# Patient Record
Sex: Male | Born: 1960
Health system: Southern US, Community
[De-identification: ages and names within clinical notes are randomized; demographics above are authoritative.]

## PROBLEM LIST (undated history)

## (undated) DIAGNOSIS — F419 Anxiety disorder, unspecified: Secondary | ICD-10-CM

## (undated) DIAGNOSIS — Z87442 Personal history of urinary calculi: Secondary | ICD-10-CM

## (undated) DIAGNOSIS — M4800 Spinal stenosis, site unspecified: Secondary | ICD-10-CM

## (undated) DIAGNOSIS — E7800 Pure hypercholesterolemia, unspecified: Secondary | ICD-10-CM

## (undated) DIAGNOSIS — M545 Low back pain, unspecified: Secondary | ICD-10-CM

## (undated) DIAGNOSIS — F329 Major depressive disorder, single episode, unspecified: Secondary | ICD-10-CM

## (undated) DIAGNOSIS — Z8489 Family history of other specified conditions: Secondary | ICD-10-CM

## (undated) DIAGNOSIS — R519 Headache, unspecified: Secondary | ICD-10-CM

## (undated) DIAGNOSIS — F32A Depression, unspecified: Secondary | ICD-10-CM

## (undated) DIAGNOSIS — J189 Pneumonia, unspecified organism: Secondary | ICD-10-CM

## (undated) DIAGNOSIS — D649 Anemia, unspecified: Secondary | ICD-10-CM

## (undated) DIAGNOSIS — I10 Essential (primary) hypertension: Secondary | ICD-10-CM

## (undated) DIAGNOSIS — G8929 Other chronic pain: Secondary | ICD-10-CM

## (undated) DIAGNOSIS — K219 Gastro-esophageal reflux disease without esophagitis: Secondary | ICD-10-CM

## (undated) DIAGNOSIS — M1990 Unspecified osteoarthritis, unspecified site: Secondary | ICD-10-CM

## (undated) HISTORY — PX: JOINT REPLACEMENT: SHX530

---

## 1979-09-28 HISTORY — PX: WISDOM TOOTH EXTRACTION: SHX21

## 1987-09-28 HISTORY — PX: HAND RECONSTRUCTION: SHX1730

## 2006-09-27 HISTORY — PX: KNEE ARTHROSCOPY: SHX127

## 2016-09-27 HISTORY — PX: EYE SURGERY: SHX253

## 2016-10-25 ENCOUNTER — Ambulatory Visit (INDEPENDENT_AMBULATORY_CARE_PROVIDER_SITE_OTHER): Payer: Self-pay | Admitting: Orthopaedic Surgery

## 2016-11-10 ENCOUNTER — Ambulatory Visit (INDEPENDENT_AMBULATORY_CARE_PROVIDER_SITE_OTHER): Payer: 59 | Admitting: Orthopaedic Surgery

## 2016-11-10 ENCOUNTER — Ambulatory Visit (INDEPENDENT_AMBULATORY_CARE_PROVIDER_SITE_OTHER): Payer: 59

## 2016-11-10 ENCOUNTER — Encounter (INDEPENDENT_AMBULATORY_CARE_PROVIDER_SITE_OTHER): Payer: Self-pay

## 2016-11-10 DIAGNOSIS — M25561 Pain in right knee: Secondary | ICD-10-CM

## 2016-11-10 DIAGNOSIS — M5441 Lumbago with sciatica, right side: Secondary | ICD-10-CM

## 2016-11-10 DIAGNOSIS — G8929 Other chronic pain: Secondary | ICD-10-CM

## 2016-11-10 MED ORDER — METHYLPREDNISOLONE ACETATE 40 MG/ML IJ SUSP
40.0000 mg | INTRAMUSCULAR | Status: AC | PRN
  Administered 2016-11-10: 40 mg via INTRA_ARTICULAR

## 2016-11-10 MED ORDER — MELOXICAM 15 MG PO TABS: 15.0000 mg | ORAL_TABLET | Freq: Every day | ORAL | 3 refills | Status: DC

## 2016-11-10 MED ORDER — LIDOCAINE HCL 1 % IJ SOLN
3.0000 mL | INTRAMUSCULAR | Status: AC | PRN
  Administered 2016-11-10: 3 mL

## 2016-11-10 MED ORDER — METHYLPREDNISOLONE 4 MG PO TABS
ORAL_TABLET | ORAL | 0 refills | Status: DC
Start: 2016-11-10 — End: 2017-10-18

## 2016-11-10 NOTE — Progress Notes (Signed)
Office Visit Note   Patient: Stephen Scott           Date of Birth: 1961/07/28           MRN: [id number] Visit Date: 11/10/2016              Requested by: No referring provider defined for this encounter. PCP: Pcp Not In System   Assessment & Plan: Visit Diagnoses:  1. Chronic pain of right knee   2. Right-sided low back pain with right-sided sciatica, unspecified chronicity     Plan: He tolerated the steroid injection well and his right knee. I gave him a handout on hyaluronic acid and I think is a perfect candidate for this. We will order this for him and I will like see him back in about 4 weeks to place this injection in his right knee. I'm also going put him on a six-day steroid taper due to his sciatica and try some meloxicam as well. I did give him prescription for physical therapy to work on his back and his knees. We do see him back at his next visit I would like 3 view x-rays of his left foot. We had a long discussion about treatment of his knee and I do not feel that he needs an arthroscopic intervention right now and in game may be a total knee replacement. However all follow him with that knee closely given his symptoms.  Follow-Up Instructions: Return in about 4 weeks (around 12/08/2016).   Orders:  Orders Placed This Encounter  Procedures  . XR Knee 1-2 Views Right  . XR Lumbar Spine 2-3 Views   Meds ordered this encounter  Medications  . meloxicam (MOBIC) 15 MG tablet    Sig: Take 1 tablet (15 mg total) by mouth daily.    Dispense:  30 tablet    Refill:  3  . methylPREDNISolone (MEDROL) 4 MG tablet    Sig: Medrol dose pack. Take as instructed    Dispense:  21 tablet    Refill:  0      Procedures: Large Joint Inj Date/Time: 11/10/2016 5:50 PM Performed by: Kathryne Hitch Authorized by: Kathryne Hitch   Location:  Knee Site:  R knee Ultrasound Guidance: No   Fluoroscopic Guidance: No   Arthrogram: No   Medications:  3 mL lidocaine 1  %; 40 mg methylPREDNISolone acetate 40 MG/ML     Clinical Data: No additional findings.   Subjective: Chief Complaint  Patient presents with  . Left Shoulder - Pain  . Right Knee - Pain  . Left Ankle - Pain  . Lower Back - Pain    HPI Stephen Scott is actually a neighbor of mine. 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 He has a lot of lifting that he does and moving of people. He's had some problems with low back pain and sciatica on the right side. He is also had some problems with right knee pain bilateral shoulder pain and left ankle pain. He has a remote history of meniscal surgery and arthroscopic surgery 10 years ago on his right knee. He wonders if there is new damage that knee because is been really bothering him for about 3 years now and getting worse. There is been some clicking and locking in his knee. He does a lot of heavy lifting and very physical work. The sciatica has been on and off for him is been getting  severe recently. She's been riding in a car for long period time and gets out of the car he has significant amount of pain but he can "walk this off" his left ankle been hurting him because he rolled his ankle back in November 2017 he's been having low but stabbing pain and he wonders of his right back issues as well. Everything he says is worse though his first getting up from a sitting position. Review of Systems Negative for bowel or bladder function changes. Negative for chest pain, headache, shortness of breath, fever, chills, nausea, vomiting.  Objective: Vital Signs: There were no vitals taken for this visit.  Physical Exam His very pleasant individual who is alert and oriented 3 in no acute distress Ortho Exam I examined his right knee first. There is no effusion of the right knee but he has significant lateral joint line tenderness and a positive Murray sign to lateral side. There is grinding of the patellofemoral  joint and the patella itself tracked slightly laterally. The knee feels ligamentously stable he has good range of motion but is deathly little painful to him. His left hip exam is normal. He does have a positive straight leg raise a left side and pain with flexion extension of lumbar spine. His left foot shows significant amount of pain over the base of the fifth metatarsal in the tendinous structures in this area. The ankles well located on the left side with no significant swelling. Specialty Comments:  No specialty comments available.  Imaging: Xr Knee 1-2 Views Right  Result Date: 11/10/2016 An AP and lateral of the right knee shows a mild valgus deformity. There is almost complete loss of the lateral joint space. There is periarticular osteophytes at the patellofemoral joint and the lateral joint space.  Xr Lumbar Spine 2-3 Views  Result Date: 11/10/2016 An AP and lateral lumbar spine shows a mild degenerative scoliosis. There is no acute findings. The lateral view shows some slight disc space narrowing and foraminal stenosis.    PMFS History: There are no active problems to display for this patient.  No past medical history on file.  No family history on file.  No past surgical history on file. Social History   Occupational History  . Not on file.   Social History Main Topics  . Smoking status: Not on file  . Smokeless tobacco: Not on file  . Alcohol use Not on file  . Drug use: Unknown  . Sexual activity: Not on file

## 2016-11-17 ENCOUNTER — Telehealth (INDEPENDENT_AMBULATORY_CARE_PROVIDER_SITE_OTHER): Payer: Self-pay | Admitting: *Deleted

## 2016-11-17 NOTE — Telephone Encounter (Signed)
Ray called stating he was faxing a form to be filled out for [employer]

## 2016-11-18 NOTE — Telephone Encounter (Signed)
Completed forms and faxed

## 2016-12-08 ENCOUNTER — Ambulatory Visit (INDEPENDENT_AMBULATORY_CARE_PROVIDER_SITE_OTHER): Payer: 59 | Admitting: Orthopaedic Surgery

## 2016-12-08 ENCOUNTER — Encounter (INDEPENDENT_AMBULATORY_CARE_PROVIDER_SITE_OTHER): Payer: Self-pay | Admitting: Orthopaedic Surgery

## 2016-12-08 DIAGNOSIS — M5442 Lumbago with sciatica, left side: Secondary | ICD-10-CM

## 2016-12-08 DIAGNOSIS — G8929 Other chronic pain: Secondary | ICD-10-CM | POA: Insufficient documentation

## 2016-12-08 DIAGNOSIS — M25561 Pain in right knee: Secondary | ICD-10-CM

## 2016-12-08 DIAGNOSIS — M1711 Unilateral primary osteoarthritis, right knee: Secondary | ICD-10-CM

## 2016-12-08 DIAGNOSIS — M5441 Lumbago with sciatica, right side: Secondary | ICD-10-CM

## 2016-12-08 NOTE — Progress Notes (Signed)
The patient is here in follow-up today for multiple orthopedic issues. He has got low back pain and stiffness with right-sided sciatica. He has osteoarthritis of his right knee. He has tendinitis of his left ankle. We'll put him on a steroid taper a said that did work great but it did increase his appetite for a week. It help with his back and his knee as well as his ankle. He is on meloxicam and 9 is help as well. He said recently he has been prescribed vitamin D supplements. His back symptoms come back and he has some electric types of shocking pain started his low back and good on his right backside. A steroid injection we placed in his right knee did help as well.  On examination he has stiffness his lumbar spine. His pain with stretch of the sciatic nerve. Has a positive straight leg raise to the right side as well. His right knee still shows a mild effusion. His left ankle tendinitis is improved.  I reviewed x-rays of his knee and lumbar spine. He does have a degenerative scoliosis lumbar spine is arthritic moderate arthritic changes of his right knee.  We already talked about hyaluronic acid disorder approved for his right needed treat the knee pain and moderate arthritis. I am going to send him to physical therapy for his back and his knee as well as his ankle. We do need to order an MRI though of his lumbar spine due to the worsening sciatic symptoms and his electric shocks to determine what other intervention potentially may be needed. We'll see him back in 4 weeks.

## 2016-12-08 NOTE — Addendum Note (Signed)
Addended by: Donalee CitrinPEELE, STEPHENEY L on: 12/08/2016 11:15 AM   Modules accepted: Orders

## 2016-12-08 NOTE — Progress Notes (Signed)
   Procedure Note  Patient: Stephen Scott             Date of Birth: 1961/01/24           MRN: [id number]             Visit Date: 12/08/2016  Procedures: Visit Diagnoses: Chronic bilateral low back pain with bilateral sciatica  Chronic pain of right knee  Unilateral primary osteoarthritis, right knee  Large Joint Inj Date/Time: 12/08/2016 9:37 AM Performed by: Kathryne HitchBLACKMAN, Maciah Feeback Y Authorized by: Kathryne HitchBLACKMAN, Dwight Adamczak Y   Location:  Knee Ultrasound Guidance: No   Fluoroscopic Guidance: No   Arthrogram: No

## 2017-01-06 ENCOUNTER — Ambulatory Visit (INDEPENDENT_AMBULATORY_CARE_PROVIDER_SITE_OTHER): Payer: 59 | Admitting: Orthopaedic Surgery

## 2017-03-10 ENCOUNTER — Telehealth (INDEPENDENT_AMBULATORY_CARE_PROVIDER_SITE_OTHER): Payer: Self-pay | Admitting: Orthopaedic Surgery

## 2017-03-10 NOTE — Telephone Encounter (Signed)
PT HAS MRI SCHEDULED AND JUST WANTED TO UPDATE INSURANCE INFORMATION

## 2017-03-11 ENCOUNTER — Other Ambulatory Visit (INDEPENDENT_AMBULATORY_CARE_PROVIDER_SITE_OTHER): Payer: Self-pay | Admitting: Orthopaedic Surgery

## 2017-04-12 ENCOUNTER — Other Ambulatory Visit (INDEPENDENT_AMBULATORY_CARE_PROVIDER_SITE_OTHER): Payer: Self-pay | Admitting: Physician Assistant

## 2017-04-12 NOTE — Telephone Encounter (Signed)
Ok to rf? 

## 2017-05-13 ENCOUNTER — Other Ambulatory Visit (INDEPENDENT_AMBULATORY_CARE_PROVIDER_SITE_OTHER): Payer: Self-pay | Admitting: Orthopaedic Surgery

## 2017-05-13 DIAGNOSIS — M5442 Lumbago with sciatica, left side: Principal | ICD-10-CM

## 2017-05-13 DIAGNOSIS — M5441 Lumbago with sciatica, right side: Principal | ICD-10-CM

## 2017-05-13 DIAGNOSIS — G8929 Other chronic pain: Secondary | ICD-10-CM

## 2017-05-15 ENCOUNTER — Other Ambulatory Visit (INDEPENDENT_AMBULATORY_CARE_PROVIDER_SITE_OTHER): Payer: Self-pay | Admitting: Physician Assistant

## 2017-05-18 ENCOUNTER — Telehealth (INDEPENDENT_AMBULATORY_CARE_PROVIDER_SITE_OTHER): Payer: Self-pay

## 2017-05-18 NOTE — Telephone Encounter (Signed)
Can you call this in for me please

## 2017-05-18 NOTE — Telephone Encounter (Signed)
Please advise 

## 2017-05-18 NOTE — Telephone Encounter (Signed)
Okay to try Robaxin 500 mg to take 1 every 6-8 hours as needed #60 with no refills.

## 2017-05-18 NOTE — Telephone Encounter (Signed)
Patient would like a Rx for a muscle relaxer.  Cb# is [phone].  Please advise. Thank You.

## 2017-05-19 MED ORDER — METHOCARBAMOL 500 MG PO TABS: ORAL_TABLET | ORAL | 0 refills | Status: DC

## 2017-05-19 NOTE — Addendum Note (Signed)
Addended by: Albertina Parr on: 05/19/2017 11:02 AM   Modules accepted: Orders

## 2017-05-19 NOTE — Telephone Encounter (Signed)
Called Rx into pharm called patient he Is aware.

## 2017-05-21 ENCOUNTER — Ambulatory Visit
Admission: RE | Admit: 2017-05-21 | Discharge: 2017-05-21 | Disposition: A | Discharge status: Home / Self Care | Payer: BLUE CROSS/BLUE SHIELD | Source: Ambulatory Visit | Attending: Orthopaedic Surgery | Admitting: Orthopaedic Surgery

## 2017-05-21 DIAGNOSIS — G8929 Other chronic pain: Secondary | ICD-10-CM

## 2017-05-21 DIAGNOSIS — M5441 Lumbago with sciatica, right side: Principal | ICD-10-CM

## 2017-05-21 DIAGNOSIS — M5442 Lumbago with sciatica, left side: Principal | ICD-10-CM

## 2017-05-21 DIAGNOSIS — M48061 Spinal stenosis, lumbar region without neurogenic claudication: Secondary | ICD-10-CM | POA: Diagnosis not present

## 2017-05-24 ENCOUNTER — Other Ambulatory Visit (INDEPENDENT_AMBULATORY_CARE_PROVIDER_SITE_OTHER): Payer: Self-pay

## 2017-05-24 ENCOUNTER — Ambulatory Visit (INDEPENDENT_AMBULATORY_CARE_PROVIDER_SITE_OTHER): Payer: BLUE CROSS/BLUE SHIELD | Admitting: Orthopaedic Surgery

## 2017-05-24 DIAGNOSIS — G8929 Other chronic pain: Secondary | ICD-10-CM

## 2017-05-24 DIAGNOSIS — M5441 Lumbago with sciatica, right side: Principal | ICD-10-CM

## 2017-05-24 DIAGNOSIS — M5442 Lumbago with sciatica, left side: Secondary | ICD-10-CM

## 2017-05-24 DIAGNOSIS — M1711 Unilateral primary osteoarthritis, right knee: Secondary | ICD-10-CM | POA: Diagnosis not present

## 2017-05-24 DIAGNOSIS — M25561 Pain in right knee: Secondary | ICD-10-CM | POA: Diagnosis not present

## 2017-05-24 MED ORDER — LIDOCAINE HCL 1 % IJ SOLN
3.0000 mL | INTRAMUSCULAR | Status: AC | PRN
  Administered 2017-05-24: 3 mL

## 2017-05-24 MED ORDER — TRAMADOL HCL 50 MG PO TABS: 100.0000 mg | ORAL_TABLET | Freq: Three times a day (TID) | ORAL | 0 refills | Status: DC | PRN

## 2017-05-24 MED ORDER — METHYLPREDNISOLONE ACETATE 40 MG/ML IJ SUSP
40.0000 mg | INTRAMUSCULAR | Status: AC | PRN
  Administered 2017-05-24: 40 mg via INTRA_ARTICULAR

## 2017-05-24 NOTE — Progress Notes (Signed)
Office Visit Note   Patient: Stephen Scott           Date of Birth: 07-10-1961           MRN: [id number] Visit Date: 05/24/2017              Requested by: No referring provider defined for this encounter. PCP: System, Pcp Not In   Assessment & Plan: Visit Diagnoses:  1. Chronic bilateral low back pain with bilateral sciatica   2. Chronic pain of right knee   3. Unilateral primary osteoarthritis, right knee     Plan: We talked about a combined approach to his symptoms. He definitely needs physical therapy in the lumbar spine and bilateral quad strengthening. I agree with trying a steroid injection. We will try to set him up for outpatient physical therapy at [facility] to work on his back as well as bilateral quad strengthening. Any modalities will be helpful to help decrease his pain and improve his quality of life and improve his mobility and function. I would also like to send him to Dr. Alvester Morin. The office for an epidural steroid injection likely to left at L3-L4 but also consider bilateral facet injections at that level. I spent at least 45 minutes with the patient and all questions were encouraged and answered. We'll see him back in 4 weeks to see how this combined approaches help. Also sent and some tramadol for him.  Follow-Up Instructions: Return in about 4 weeks (around 06/21/2017).   Orders:  No orders of the defined types were placed in this encounter.  No orders of the defined types were placed in this encounter.     Procedures: Large Joint Inj Date/Time: 05/24/2017 8:36 AM Performed by: Kathryne Hitch Authorized by: Kathryne Hitch   Location:  Knee Site:  R knee Ultrasound Guidance: No   Fluoroscopic Guidance: No   Arthrogram: No   Medications:  3 mL lidocaine 1 %; 40 mg methylPREDNISolone acetate 40 MG/ML     Clinical Data: No additional findings.   Subjective: Chief Complaint  Patient presents with  .  Lower Back - Follow-up   Patient is well-known noted. He has worsening low back pain with sciatic symptoms going down his left leg with some of his right leg. He gets a lot of burning in his backside and it does radiate into the general region as well. His biggest complaints are going down the left side although it was foot. He also has a bunion deformity on his left foot on the great toe cockup deformity of second toe. He has known significant osteoporosis of the right knee. He's had a steroid injection that knee and a hyaluronic injection. The hyaluronic injection was about 6 months ago. He has had an increase his blood pressure. He is on meloxicam and a muscle relaxant. 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 We did obtain an MRI of his lumbar spine is here for review that today. He would like to have a steroid injection in his right knee today as well. HPI  Review of Systems He currently denies any headache, chest pain, short of breath, fever, chills, nausea, vomiting.  Objective: Vital Signs: There were no vitals taken for this visit.  Physical Exam He is alert and oriented 3 and in no acute distress Ortho Exam Examination of his lumbar spine shows significant pain with flexion-extension and limitations in this due to pain and low  back at the facet joint regions. He does have radicular symptoms going down his left leg or his foot. He has a bunion deformity on his left foot and a cockup deformity second toe left side. He has medial lateral joint line tenderness with full range of motion of the right knee no effusion but definitely patellofemoral crepitation as well. Specialty Comments:  No specialty comments available.  Imaging: No results found. The MRI shows multifactorial stenosis lumbar spine especially at L3-L4 with a large disc protrusion combined with significant facet arthritis causing severe foraminal stenosis on the left side. He does have facet disease  at L4-L5 and L5-S1 as well.  PMFS History: Patient Active Problem List   Diagnosis Date Noted  . Chronic pain of right knee 05/24/2017  . Unilateral primary osteoarthritis, right knee 05/24/2017  . Chronic bilateral low back pain with bilateral sciatica 12/08/2016   No past medical history on file.  No family history on file.  No past surgical history on file. Social History   Occupational History  . Not on file.   Social History Main Topics  . Smoking status: Never Smoker  . Smokeless tobacco: Never Used  . Alcohol use Not on file  . Drug use: Unknown  . Sexual activity: Not on file

## 2017-06-08 ENCOUNTER — Encounter (INDEPENDENT_AMBULATORY_CARE_PROVIDER_SITE_OTHER): Payer: Self-pay | Admitting: Physical Medicine and Rehabilitation

## 2017-06-08 ENCOUNTER — Ambulatory Visit (INDEPENDENT_AMBULATORY_CARE_PROVIDER_SITE_OTHER): Payer: BLUE CROSS/BLUE SHIELD

## 2017-06-08 ENCOUNTER — Ambulatory Visit (INDEPENDENT_AMBULATORY_CARE_PROVIDER_SITE_OTHER): Payer: BLUE CROSS/BLUE SHIELD | Admitting: Physical Medicine and Rehabilitation

## 2017-06-08 VITALS — BP 136/79 | HR 70

## 2017-06-08 DIAGNOSIS — M48062 Spinal stenosis, lumbar region with neurogenic claudication: Secondary | ICD-10-CM

## 2017-06-08 DIAGNOSIS — R55 Syncope and collapse: Secondary | ICD-10-CM

## 2017-06-08 DIAGNOSIS — M5416 Radiculopathy, lumbar region: Secondary | ICD-10-CM

## 2017-06-08 MED ORDER — BETAMETHASONE SOD PHOS & ACET 6 (3-3) MG/ML IJ SUSP
12.0000 mg | Freq: Once | INTRAMUSCULAR | Status: AC
  Administered 2017-06-08: 12 mg

## 2017-06-08 MED ORDER — LIDOCAINE HCL (PF) 1 % IJ SOLN
2.0000 mL | Freq: Once | INTRAMUSCULAR | Status: AC
  Administered 2017-06-08: 2 mL

## 2017-06-08 NOTE — Patient Instructions (Signed)

## 2017-06-08 NOTE — Progress Notes (Deleted)
Low back pain for several months.Shock like pain down both legs to foot. Sometimes shoots to toes. Worse on left side. Tightness feeling across back. Numbness around waistline and around to groin at times. Vaso vagal

## 2017-06-09 NOTE — Progress Notes (Signed)
Stephen Scott - 56 y.o. male MRN [id number]  Date of birth: 1961-07-25  Office Visit Note: Visit Date: 06/08/2017 PCP: System, Pcp Not In Referred by: No ref. provider found  Subjective: Chief Complaint  Patient presents with  . Lower Back - Pain   HPI: Mr. Stephen Scott is a 56 year old gentleman accompanied by his wife today does provide some of the history. 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 Magnus Ivan in the office for multiple muscular skeletal complaints including the arthritis and tendinitis as well as most recently low back pain. The patient is reporting today several months of worsening severe pain in the lower back with a shocklike pain going down both legs to the feet and more of a posterior lateral fashion. Sometimes he gets pain shooting into the toes. He reports worse on the left side. As a tightness across the low back that is fairly constant but mostly with standing and ambulating. If he sits he does get some relief. He gets numbness or a sensation of numbness that he can't quite explain around the waistline and around the groin and even into the saddle area. The patient has had MRI of the lumbar spine by Dr. Magnus Ivan in this is reviewed below. It did show moderate canal stenosis at L3-4 with pretty significant facet arthropathy at L3-4 and L4-5. He had some foraminal stenosis on the left at L3-4. The patient does not report any prior history of symptoms down the legs. He's had off and on back pain and other muscular skeletal complaints. He reports no specific injury however he has had a change in his job over the last year. 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 He also does a lot of moving while at the funeral home. Prior to this he had a fairly sedentary job. He does not carry any history of other diagnoses other than he is on  Wellbutrin so he does have some history of depression or anxiety. Today prior to coming in for possible injection and evaluation he did take 2 tramadol as well as a muscle relaxer. He denies any bowel or bladder changes. No focal weakness but some weakness overall, he denies any fevers chills or night sweats.    Review of Systems  Constitutional: Positive for malaise/fatigue. Negative for chills, fever and weight loss.  HENT: Negative for hearing loss and sinus pain.   Eyes: Negative for blurred vision, double vision and photophobia.  Respiratory: Negative for cough and shortness of breath.   Cardiovascular: Negative for chest pain, palpitations and leg swelling.  Gastrointestinal: Negative for abdominal pain, nausea and vomiting.  Genitourinary: Negative for flank pain.  Musculoskeletal: Positive for back pain and joint pain. Negative for myalgias.  Skin: Negative for itching and rash.  Neurological: Positive for tingling. Negative for tremors, focal weakness and weakness.  Endo/Heme/Allergies: Negative.   Psychiatric/Behavioral: Negative for depression.  All other systems reviewed and are negative.  Otherwise per HPI.  Assessment & Plan: Visit Diagnoses:  1. Lumbar radiculopathy   2. Spinal stenosis of lumbar region with neurogenic claudication   3. Vasovagal episode     Plan: Findings:  Chronic worsening severe several month history of low back pain with radicular-type pain in the legs. Some of his symptoms are more somatic with feelings of tingling and numbness  in areas that she really wouldn't expect with the level of problems on his MRI however I think he is getting a lot of pain from his lumbar spine. He has moderate stenosis at L3-4 and pretty severe facet arthropathy. When I viewed the stenosis from the foraminal standpoint I didn't feel like it was that great. We are going to complete bilateral L3 transforaminal injections at the level of stenosis and see how much relief he gets.  Depending on this relief would look at facet joint blocks as I think that may be causing a lot of his back pain as well. I think he does have an underlying anxiety disorder which may just be generalized anxiety which is very common. As noted in the procedure note today which we did complete because of the severity of his symptoms he did have a vasovagal episode which was fairly mild. In the future probably would provide preprocedure Valium if needed.    Meds & Orders:  Meds ordered this encounter  Medications  . lidocaine (PF) (XYLOCAINE) 1 % injection 2 mL  . betamethasone acetate-betamethasone sodium phosphate (CELESTONE) injection 12 mg    Orders Placed This Encounter  Procedures  . XR C-ARM NO REPORT  . Epidural Steroid injection    Follow-up: Return if symptoms worsen or fail to improve, for Dr Blackman.   ProceduresMagnus Ivan: No procedures performed  Lumbosacral Transforaminal Epidural Steroid Injection - Sub-Pedicular Approach with Fluoroscopic Guidance  Patient: Stephen Scott      Date of Birth: [birth year] MRN: [id number] PCP: System, Pcp Not In      Visit Date: 06/08/2017   Universal Protocol:    Date/Time: 06/08/2017  Consent Given By: the patient  Position: PRONE  Additional Comments: Vital signs were monitored before and after the procedure. Patient was prepped and draped in the usual sterile fashion. The correct patient, procedure, and site was verified.   Injection Procedure Details:  Procedure Site One Meds Administered:  Meds ordered this encounter  Medications  . lidocaine (PF) (XYLOCAINE) 1 % injection 2 mL  . betamethasone acetate-betamethasone sodium phosphate (CELESTONE) injection 12 mg    Laterality: Bilateral  Location/Site:  L3-L4  Needle size: 22 G  Needle type: Spinal  Needle Placement: Transforaminal  Findings:  -Contrast Used: 1 mL iohexol 180 mg iodine/mL   -Comments: Excellent flow of contrast along the nerve and into the epidural space.   Right-sided injection showed good flow of contrast initially but then we tried to reposition to get really better flow of contrast in the epidural space. At that point due to facet arthropathy which is quite large we ended up getting some flow of contrast into the facet joint. Again with a little bit of repositioning we did get good flow into the epidural space. On the left side with really had no difficulty due to the arthropathy on that side.  Procedure Details: After squaring off the end-plates to get a true AP view, the C-arm was positioned so that an oblique view of the foramen as noted above was visualized. The target area is just inferior to the "nose of the scotty dog" or sub pedicular. The soft tissues overlying this structure were infiltrated with 2-3 ml. of 1% Lidocaine without Epinephrine.  The spinal needle was inserted toward the target using a "trajectory" view along the fluoroscope beam.  Under AP and lateral visualization, the needle was advanced so it did not puncture dura and was located close the 6 O'Clock position of the pedical in AP  tracterory. Biplanar projections were used to confirm position. Aspiration was confirmed to be negative for CSF and/or blood. A 1-2 ml. volume of Isovue-250 was injected and flow of contrast was noted at each level. Radiographs were obtained for documentation purposes.   After attaining the desired flow of contrast documented above, a 0.5 to 1.0 ml test dose of 0.25% Marcaine was injected into each respective transforaminal space.  The patient was observed for 90 seconds post injection.  After no sensory deficits were reported, and normal lower extremity motor function was noted,   the above injectate was administered so that equal amounts of the injectate were placed at each foramen (level) into the transforaminal epidural space.   Additional Comments:  The patient tolerated the procedure well. Although he did start to have mild vasovagal symptoms  during the procedure. Dressing: Band-Aid    Post-procedure details: Patient was observed during the procedure. Post-procedure instructions were reviewed.  Patient left the clinic in stable condition.       Clinical History: IMPRESSION: 1. Moderate spinal canal stenosis at L3-L4 due to combination of facet arthrosis and large disc bulge. Moderate right, severe left neural foraminal stenosis also at this level. 2. Moderate bilateral L4-L5 and mild-to-moderate bilateral L5-S1 neural foraminal stenosis. 3. Severe facet arthrosis at L3-L4 and L4-L5 may serve as a source of local low back pain.   Electronically Signed   By: Deatra Robinson M.D.   On: 05/21/2017 22:41  He reports that he has never smoked. He has never used smokeless tobacco. No results for input(s): HGBA1C, LABURIC in the last 8760 hours.  Objective:  VS:  HT:    WT:   BMI:     BP:136/79  HR:70bpm  TEMP: ( )  RESP:95 % Physical Exam  Ortho Exam Imaging: Xr C-arm No Report  Result Date: 06/08/2017 Please see Notes or Procedures tab for imaging impression.   Past Medical/Family/Surgical/Social History: Medications & Allergies reviewed per EMR Patient Active Problem List   Diagnosis Date Noted  . Chronic pain of right knee 05/24/2017  . Unilateral primary osteoarthritis, right knee 05/24/2017  . Chronic bilateral low back pain with bilateral sciatica 12/08/2016   No past medical history on file. No family history on file. No past surgical history on file. Social History   Occupational History  . Not on file.   Social History Main Topics  . Smoking status: Never Smoker  . Smokeless tobacco: Never Used  . Alcohol use Not on file  . Drug use: Unknown  . Sexual activity: Not on file

## 2017-06-09 NOTE — Procedures (Signed)
Lumbosacral Transforaminal Epidural Steroid Injection - Sub-Pedicular Approach with Fluoroscopic Guidance  Patient: Stephen ShipJohn Scott      Date of Birth: 06/21/1961 MRN: [id number] PCP: System, Pcp Not In      Visit Date: 06/08/2017   Universal Protocol:    Date/Time: 06/08/2017  Consent Given By: the patient  Position: PRONE  Additional Comments: Vital signs were monitored before and after the procedure. Patient was prepped and draped in the usual sterile fashion. The correct patient, procedure, and site was verified.   Injection Procedure Details:  Procedure Site One Meds Administered:  Meds ordered this encounter  Medications  . lidocaine (PF) (XYLOCAINE) 1 % injection 2 mL  . betamethasone acetate-betamethasone sodium phosphate (CELESTONE) injection 12 mg    Laterality: Bilateral  Location/Site:  L3-L4  Needle size: 22 G  Needle type: Spinal  Needle Placement: Transforaminal  Findings:  -Contrast Used: 1 mL iohexol 180 mg iodine/mL   -Comments: Excellent flow of contrast along the nerve and into the epidural space.  Right-sided injection showed good flow of contrast initially but then we tried to reposition to get really better flow of contrast in the epidural space. At that point due to facet arthropathy which is quite large we ended up getting some flow of contrast into the facet joint. Again with a little bit of repositioning we did get good flow into the epidural space. On the left side with really had no difficulty due to the arthropathy on that side.  Procedure Details: After squaring off the end-plates to get a true AP view, the C-arm was positioned so that an oblique view of the foramen as noted above was visualized. The target area is just inferior to the "nose of the scotty dog" or sub pedicular. The soft tissues overlying this structure were infiltrated with 2-3 ml. of 1% Lidocaine without Epinephrine.  The spinal needle was inserted toward the target using a  "trajectory" view along the fluoroscope beam.  Under AP and lateral visualization, the needle was advanced so it did not puncture dura and was located close the 6 O'Clock position of the pedical in AP tracterory. Biplanar projections were used to confirm position. Aspiration was confirmed to be negative for CSF and/or blood. A 1-2 ml. volume of Isovue-250 was injected and flow of contrast was noted at each level. Radiographs were obtained for documentation purposes.   After attaining the desired flow of contrast documented above, a 0.5 to 1.0 ml test dose of 0.25% Marcaine was injected into each respective transforaminal space.  The patient was observed for 90 seconds post injection.  After no sensory deficits were reported, and normal lower extremity motor function was noted,   the above injectate was administered so that equal amounts of the injectate were placed at each foramen (level) into the transforaminal epidural space.   Additional Comments:  The patient tolerated the procedure well. Although he did start to have mild vasovagal symptoms during the procedure. Dressing: Band-Aid    Post-procedure details: Patient was observed during the procedure. Post-procedure instructions were reviewed.  Patient left the clinic in stable condition.

## 2017-06-13 ENCOUNTER — Other Ambulatory Visit (INDEPENDENT_AMBULATORY_CARE_PROVIDER_SITE_OTHER): Payer: Self-pay | Admitting: Physician Assistant

## 2017-06-14 ENCOUNTER — Ambulatory Visit: Payer: BLUE CROSS/BLUE SHIELD | Attending: Orthopaedic Surgery | Admitting: Physical Therapy

## 2017-06-14 ENCOUNTER — Encounter: Payer: Self-pay | Admitting: Physical Therapy

## 2017-06-14 DIAGNOSIS — G8929 Other chronic pain: Secondary | ICD-10-CM | POA: Insufficient documentation

## 2017-06-14 DIAGNOSIS — M5442 Lumbago with sciatica, left side: Secondary | ICD-10-CM | POA: Insufficient documentation

## 2017-06-14 DIAGNOSIS — M5441 Lumbago with sciatica, right side: Secondary | ICD-10-CM | POA: Insufficient documentation

## 2017-06-14 NOTE — Therapy (Signed)
[facility] Outpatient Rehabilitation [facility] [address] [place], [place], [address] Phone: [phone]   Fax:  [phone]  Physical Therapy Evaluation  Patient Details  Name: Stephen Scott MRN: [id number] Date of Birth: 02-26-1961 Referring Provider: Kathryne Hitch, MD  Encounter Date: 06/14/2017      PT End of Session - 06/14/17 0857    Visit Number 1   Number of Visits 13   Date for PT Re-Evaluation 07/29/17   Authorization Type BC[employer] 30 visit limit   PT Start Time 0857  pt arrived late   PT Stop Time 0937   PT Time Calculation (min) 40 min   Activity Tolerance Patient tolerated treatment well   Behavior During Therapy [facility] for tasks assessed/performed      History reviewed. No pertinent past medical history.  Past Surgical History:  Procedure Laterality Date  . HAND SURGERY Left    1989, reconstructive  . KNEE SURGERY Right    2008  . MOUTH SURGERY     1980    There were no vitals filed for this visit.       Subjective Assessment - 06/14/17 0905    Subjective Pt reports chronic LBP as well as pain in right knee. Pt reports diagnosis of multiple buldging disks, DDD, arthritis and stenosis. Injection last week. Does a lot of lifting at work. Bilateral LE involvement, R leg is bad because of knee but L is worse from back. Reports lower body numbness and soreness at night time.    How long can you sit comfortably? 15-20 min   Patient Stated Goals sit in car, soreness at night, lifting/moving   Currently in Pain? Yes   Pain Score 7    Pain Location Back   Pain Orientation Lower;Right;Left            [facility] PT Assessment - 06/14/17 0001      Assessment   Medical Diagnosis bilateral LBP   Referring Provider Kathryne Hitch, MD   Hand Dominance Right   Prior Therapy no     Precautions   Precautions None     Restrictions   Weight Bearing Restrictions No     Balance Screen   Has the patient fallen in the past 6  months No     Prior Function   Vocation Full time employment   Patent attorney     Cognition   Overall Cognitive Status Within Functional Limits for tasks assessed     Observation/Other Assessments   Focus on Therapeutic Outcomes (FOTO)  68% limited     Sensation   Additional Comments bilat radicular symptoms/pain     Posture/Postural Control   Posture Comments lacking full hip extension, decreased lumbar lordosis     ROM / Strength   AROM / PROM / Strength AROM;Strength     AROM   AROM Assessment Site Lumbar     Strength   Strength Assessment Site Hip   Right/Left Hip Right;Left            Objective measurements completed on examination: See above findings.          [facility] Adult PT Treatment/Exercise - 06/14/17 0001      Exercises   Exercises Knee/Hip     Knee/Hip Exercises: Stretches   Passive Hamstring Stretch Limitations seated edge of chair     Knee/Hip Exercises: Seated   Abd/Adduction Limitations iso adduction with fists + exhale for abdominal engagement  PT Education - 06/14/17 0901    Education provided Yes   Education Details anatomy of condition, POC, HEP, exercise form/rationale, sleeping posture   Person(s) Educated Patient   Methods Explanation;Demonstration;Tactile cues;Verbal cues;Handout   Comprehension Verbalized understanding;Returned demonstration;Verbal cues required;Tactile cues required;Need further instruction          PT Short Term Goals - 06/14/17 1028      PT SHORT TERM GOAL #1   Title Pt will verbalize ability to utilize abdominal engagement during resting postures to decrease back pain throughout the day   Baseline began educating at eval   Time 3   Period Weeks   Status New   Target Date 07/08/17     PT SHORT TERM GOAL #2   Title Pt will verbalize 50% improvement in overall pain during daily activities   Baseline severe pain at eval   Time 3   Period Weeks    Status New   Target Date 07/08/17           PT Long Term Goals - 06/14/17 1033      PT LONG TERM GOAL #1   Title FOTO to 52% limitation to indicate significant improvement in functional ability   Baseline 68% limitation at eval   Time 6   Period Weeks   Status New   Target Date 07/29/17     PT LONG TERM GOAL #2   Title Bilateral hip gross MMT to 5/5 without increased pain for support to lumbopelvic/LE biomechanical chain   Baseline see flowsheet, significant discomfort with active movement- MMT to be taken at next visit   Time 6   Period Weeks   Status New   Target Date 07/29/17     PT LONG TERM GOAL #3   Title Pt will be able to complete lifting activities at work when removing from hospital/morgue LBP <=3/10   Baseline severe pain at eval   Time 6   Period Weeks   Status New   Target Date 07/29/17     PT LONG TERM GOAL #4   Title Pt will be able to sit in the car for at least 45 min without limitation by LBP   Baseline 15-20 min when severe pain increases   Time 6   Period Weeks   Status New   Target Date 07/29/17     PT LONG TERM GOAL #5   Title Pt will be able to sleep without limitation by LBP   Baseline limited at eval   Time 6   Period Weeks   Status New   Target Date 07/29/17                Plan - 06/14/17 1021    Clinical Impression Statement Pt presents to PT with complaints of LBP that is chronic. 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 Notable limitation in lumbar spine via guarding and lacking full hip ext bilaterally upon standing. Due to limited time, objective measures will be taken at next visit. Pt will benefit from skilled PT in order to improve abdominal engagement to provide support to lumbopelvic biomechanical chain and decrease pain. Discussed sleeping posture today as well as seated hamstring stretch and iso add of bilat LE with breathing for abdominal engagement. Pt will also benefit from ergonomics  training for lifting. Pt verbalized feeling of depression and life lmitation by pain and will benefit from pain education to reduce stress.    History and Personal Factors relevant to  plan of care: R knee OA, chronic pain   Clinical Presentation Stable   Clinical Decision Making Low   Rehab Potential Good   PT Frequency 2x / week   PT Duration 6 weeks   PT Treatment/Interventions ADLs/Self Care Home Management;Cryotherapy;Electrical Stimulation;Iontophoresis /ml Dexamethasone;Functional mobility training;Stair training;Gait training;Ultrasound;Traction;Moist Heat;Therapeutic activities;Therapeutic exercise;Neuromuscular re-education;Balance training;Patient/family education;Passive range of motion;Manual techniques;Dry needling;Taping   PT Next Visit Plan MMT, abdominal engagement, traction   PT Home Exercise Plan seated HSS, LE add with exhale for abdominal engagement, sleeping posture;    Consulted and Agree with Plan of Care Patient      Patient will benefit from skilled therapeutic intervention in order to improve the following deficits and impairments:  Decreased range of motion, Difficulty walking, Increased muscle spasms, Decreased activity tolerance, Pain, Improper body mechanics, Impaired flexibility, Hypomobility, Decreased strength, Decreased mobility, Impaired sensation, Postural dysfunction  Visit Diagnosis: Chronic bilateral low back pain with bilateral sciatica - Plan: PT plan of care cert/re-cert     Problem List Patient Active Problem List   Diagnosis Date Noted  . Chronic pain of right knee 05/24/2017  . Unilateral primary osteoarthritis, right knee 05/24/2017  . Chronic bilateral low back pain with bilateral sciatica 12/08/2016    Maelyn Berrey C. Afrika Brick PT, DPT 06/14/17 10:53 AM   [facility] Outpatient Rehabilitation [facility] [address] [place], [place], [address] Phone: [phone]   Fax:  [phone]  Name: Stephen Scott MRN:  [id number] Date of Birth: [birth year]

## 2017-06-15 ENCOUNTER — Other Ambulatory Visit (INDEPENDENT_AMBULATORY_CARE_PROVIDER_SITE_OTHER): Payer: Self-pay | Admitting: Physician Assistant

## 2017-06-15 ENCOUNTER — Other Ambulatory Visit (INDEPENDENT_AMBULATORY_CARE_PROVIDER_SITE_OTHER): Payer: Self-pay | Admitting: Orthopaedic Surgery

## 2017-06-15 NOTE — Telephone Encounter (Signed)
Please advise 

## 2017-06-15 NOTE — Telephone Encounter (Signed)
Called into pharmacy

## 2017-06-21 ENCOUNTER — Ambulatory Visit (INDEPENDENT_AMBULATORY_CARE_PROVIDER_SITE_OTHER): Payer: BLUE CROSS/BLUE SHIELD | Admitting: Orthopaedic Surgery

## 2017-06-21 DIAGNOSIS — G8929 Other chronic pain: Secondary | ICD-10-CM | POA: Diagnosis not present

## 2017-06-21 DIAGNOSIS — M5441 Lumbago with sciatica, right side: Secondary | ICD-10-CM

## 2017-06-21 DIAGNOSIS — M25561 Pain in right knee: Secondary | ICD-10-CM

## 2017-06-21 DIAGNOSIS — M1711 Unilateral primary osteoarthritis, right knee: Secondary | ICD-10-CM | POA: Diagnosis not present

## 2017-06-21 DIAGNOSIS — M5442 Lumbago with sciatica, left side: Secondary | ICD-10-CM | POA: Diagnosis not present

## 2017-06-21 MED ORDER — BUPROPION HCL 75 MG PO TABS
75.0000 mg | ORAL_TABLET | Freq: Two times a day (BID) | ORAL | 3 refills | Status: DC
Start: 2017-06-21 — End: 2017-10-18

## 2017-06-21 NOTE — Progress Notes (Signed)
Stephen Scott is following up after having bilateral L3-L4 injections by Dr. Alvester Morin. That is helped significantly but he still and quite a bit of pain. He is scheduled to go to his first full physical therapy visit this week. He's already had a physical therapy evaluation of showed him some things to try. We have also been dealing with a significant arthritic right knee. He had a hyaluronic acid injection back in February. In the interim he has had steroid injections in that right knee. Due to the osteoarthritis pain I'm recommending another hyaluronic acid injection since is been over 6 months.  On examination of his knee he has good range of motion of the right knee but is painful with varus malalignment and medial joint line tenderness with patella from crepitation. He has a positive straight leg raise bilaterally as was pain with flexion extension his lumbar spine.  We'll order the hyaluronic acid injection for his right knee. At some point he may need a knee replacement surgery. We'll see how therapy does for him in the interim and he may end up needing repeat injections by Dr. Alvester Morin. We'll continue evaluating closely.

## 2017-06-23 ENCOUNTER — Encounter: Payer: Self-pay | Admitting: Physical Therapy

## 2017-06-23 ENCOUNTER — Ambulatory Visit: Payer: BLUE CROSS/BLUE SHIELD | Admitting: Physical Therapy

## 2017-06-23 DIAGNOSIS — G8929 Other chronic pain: Secondary | ICD-10-CM

## 2017-06-23 DIAGNOSIS — M5441 Lumbago with sciatica, right side: Principal | ICD-10-CM

## 2017-06-23 DIAGNOSIS — M5442 Lumbago with sciatica, left side: Principal | ICD-10-CM

## 2017-06-23 NOTE — Therapy (Signed)
[facility] Outpatient Rehabilitation [facility] [address] [place], [place], [address] Phone: [phone]   Fax:  [phone]  Physical Therapy Treatment  Patient Details  Name: Stephen Scott MRN: [id number] Date of Birth: 1961/01/08 Referring Provider: Kathryne Hitch, MD  Encounter Date: 06/23/2017      PT End of Session - 06/23/17 0932    Visit Number 2   Number of Visits 13   Date for PT Re-Evaluation 07/29/17   PT Start Time 0845   PT Stop Time 0945   PT Time Calculation (min) 60 min   Activity Tolerance Patient tolerated treatment well   Behavior During Therapy [facility] for tasks assessed/performed      History reviewed. No pertinent past medical history.  Past Surgical History:  Procedure Laterality Date  . HAND SURGERY Left    1989, reconstructive  . KNEE SURGERY Right    2008  . MOUTH SURGERY     1980    There were no vitals filed for this visit.      Subjective Assessment - 06/23/17 0858    Subjective Pain 7/10 n0w was 10/10 this morning.  I had to get my shirt altered different length for the sleeves.   Currently in Pain? Yes   Pain Score 7    Pain Location Back   Pain Orientation Lower;Left;Right   Pain Descriptors / Indicators Aching   Pain Radiating Towards left leg   Multiple Pain Sites --  kenn pain            [facility] PT Assessment - 06/23/17 0001      Strength   Overall Strength --  left/ rightflexion 4/10,  left knee 3-/10 limited by pain                     [facility] Adult PT Treatment/Exercise - 06/23/17 0001      Lumbar Exercises: Standing   Other Standing Lumbar Exercises [place] lateral shifting,  left side toward wall.      Lumbar Exercises: Sidelying   Other Sidelying Lumbar Exercises McKenzie Lateral technique 3 positions 2 standing,  1 on side     Knee/Hip Exercises: Sidelying   Other Sidelying Knee/Hip Exercises QLleft stretching,  tender , tight     Modalities   Modalities Traction      Traction   Type of Traction Lumbar   Min (lbs) 60   Max (lbs) 100  weighs 250 LBS   Hold Time --  pre set, 3 steps up/ down   Rest Time pre set   Time 17 minutes                PT Education - 06/23/17 0910    Education provided Yes   Education Details HEP, posture   Person(s) Educated Patient   Methods Explanation;Demonstration;Verbal cues;Handout   Comprehension Verbalized understanding;Returned demonstration          PT Short Term Goals - 06/14/17 1028      PT SHORT TERM GOAL #1   Title Pt will verbalize ability to utilize abdominal engagement during resting postures to decrease back pain throughout the day   Baseline began educating at eval   Time 3   Period Weeks   Status New   Target Date 07/08/17     PT SHORT TERM GOAL #2   Title Pt will verbalize 50% improvement in overall pain during daily activities   Baseline severe pain at eval   Time 3   Period  Weeks   Status New   Target Date 07/08/17           PT Long Term Goals - 06/14/17 1033      PT LONG TERM GOAL #1   Title FOTO to 52% limitation to indicate significant improvement in functional ability   Baseline 68% limitation at eval   Time 6   Period Weeks   Status New   Target Date 07/29/17     PT LONG TERM GOAL #2   Title Bilateral hip gross MMT to 5/5 without increased pain for support to lumbopelvic/LE biomechanical chain   Baseline see flowsheet, significant discomfort with active movement- MMT to be taken at next visit   Time 6   Period Weeks   Status New   Target Date 07/29/17     PT LONG TERM GOAL #3   Title Pt will be able to complete lifting activities at work when removing from hospital/morgue LBP <=3/10   Baseline severe pain at eval   Time 6   Period Weeks   Status New   Target Date 07/29/17     PT LONG TERM GOAL #4   Title Pt will be able to sit in the car for at least 45 min without limitation by LBP   Baseline 15-20 min when severe pain increases   Time 6    Period Weeks   Status New   Target Date 07/29/17     PT LONG TERM GOAL #5   Title Pt will be able to sleep without limitation by LBP   Baseline limited at eval   Time 6   Period Weeks   Status New   Target Date 07/29/17               Plan - 06/23/17 0933    Clinical Impression Statement MMknee strengt left limited by back pain.  Will MMT next visit.  Patient presented with lateral shift with hips right.  Able to get neutral after McKenzie lateral techniques which were added to HEP.  Trial traction.  Pain decreased in legs post exercise.    PT Next Visit Plan MMT, abdominal engagement, traction assess   PT Home Exercise Plan seated HSS, LE add with exhale for abdominal engagement, sleeping posture;    Consulted and Agree with Plan of Care Patient      Patient will benefit from skilled therapeutic intervention in order to improve the following deficits and impairments:     Visit Diagnosis: Chronic bilateral low back pain with bilateral sciatica     Problem List Patient Active Problem List   Diagnosis Date Noted  . Chronic pain of right knee 05/24/2017  . Unilateral primary osteoarthritis, right knee 05/24/2017  . Chronic bilateral low back pain with bilateral sciatica 12/08/2016    [facility] PTA 06/23/2017, 9:36 AM  [facility] [address] [place], [place], [address] Phone: [phone]   Fax:  [phone]  Name: Stephen Scott MRN: [id number] Date of Birth: 03-27-1961

## 2017-06-24 ENCOUNTER — Ambulatory Visit: Payer: BLUE CROSS/BLUE SHIELD | Admitting: Physical Therapy

## 2017-06-24 DIAGNOSIS — M5442 Lumbago with sciatica, left side: Principal | ICD-10-CM

## 2017-06-24 DIAGNOSIS — M5441 Lumbago with sciatica, right side: Secondary | ICD-10-CM | POA: Diagnosis not present

## 2017-06-24 DIAGNOSIS — G8929 Other chronic pain: Secondary | ICD-10-CM

## 2017-06-24 NOTE — Therapy (Signed)
[facility] Outpatient Rehabilitation [facility] [address] [place], [place], [address] Phone: [phone]   Fax:  [phone]  Physical Therapy Treatment  Patient Details  Name: Stephen Scott MRN: [id number] Date of Birth: 1960/10/28 Referring Provider: Kathryne Hitch, MD  Encounter Date: 06/24/2017      PT End of Session - 06/24/17 1222    Visit Number 3   Number of Visits 13   Date for PT Re-Evaluation 07/29/17   Authorization Type BCBS 30 visit limit   PT Start Time 0845   PT Stop Time 0940   PT Time Calculation (min) 55 min   Activity Tolerance Patient tolerated treatment well   Behavior During Therapy [facility] for tasks assessed/performed      No past medical history on file.  Past Surgical History:  Procedure Laterality Date  . HAND SURGERY Left    1989, reconstructive  . KNEE SURGERY Right    2008  . MOUTH SURGERY     1980    There were no vitals filed for this visit.      Subjective Assessment - 06/24/17 1221    Subjective Patients back pain was better after the treatment but at this time his back pain is about 8/10. He had a lot of pain last night.    How long can you sit comfortably? 15-20 min   Patient Stated Goals sit in car, soreness at night, lifting/moving   Currently in Pain? Yes   Pain Score 8    Pain Location Back   Pain Orientation Left;Lower;Right   Pain Descriptors / Indicators Aching   Pain Radiating Towards left leg pain    Pain Onset More than a month ago   Pain Frequency Constant   Aggravating Factors  bendining standing, activity    Pain Relieving Factors rest    Effect of Pain on Daily Activities difficulty perfroming full work tasks                          [facility] Adult PT Treatment/Exercise - 06/24/17 0001      Lumbar Exercises: Seated   Other Seated Lumbar Exercises seated isometric ball press x10; quadratus stretch left and right 2x5;      Traction   Type of Traction Lumbar   Min  (lbs) 60   Max (lbs) 100  weighs 250 LBS   Hold Time --  pre set, 3 steps up/ down   Rest Time pre set   Time 17 minutes     Manual Therapy   Manual Therapy Soft tissue mobilization   Manual therapy comments STM to quadratus and lumbar paraspinals with emphasis on right                PT Education - 06/24/17 1222    Education provided Yes   Education Details reviewed stretching and soft tissue mobilization    Person(s) Educated Patient   Methods Explanation;Demonstration;Tactile cues;Verbal cues   Comprehension Verbalized understanding;Returned demonstration          PT Short Term Goals - 06/14/17 1028      PT SHORT TERM GOAL #1   Title Pt will verbalize ability to utilize abdominal engagement during resting postures to decrease back pain throughout the day   Baseline began educating at eval   Time 3   Period Weeks   Status New   Target Date 07/08/17     PT SHORT TERM GOAL #2   Title Pt will verbalize  50% improvement in overall pain during daily activities   Baseline severe pain at eval   Time 3   Period Weeks   Status New   Target Date 07/08/17           PT Long Term Goals - 06/14/17 1033      PT LONG TERM GOAL #1   Title FOTO to 52% limitation to indicate significant improvement in functional ability   Baseline 68% limitation at eval   Time 6   Period Weeks   Status New   Target Date 07/29/17     PT LONG TERM GOAL #2   Title Bilateral hip gross MMT to 5/5 without increased pain for support to lumbopelvic/LE biomechanical chain   Baseline see flowsheet, significant discomfort with active movement- MMT to be taken at next visit   Time 6   Period Weeks   Status New   Target Date 07/29/17     PT LONG TERM GOAL #3   Title Pt will be able to complete lifting activities at work when removing from hospital/morgue LBP <=3/10   Baseline severe pain at eval   Time 6   Period Weeks   Status New   Target Date 07/29/17     PT LONG TERM GOAL #4    Title Pt will be able to sit in the car for at least 45 min without limitation by LBP   Baseline 15-20 min when severe pain increases   Time 6   Period Weeks   Status New   Target Date 07/29/17     PT LONG TERM GOAL #5   Title Pt will be able to sleep without limitation by LBP   Baseline limited at eval   Time 6   Period Weeks   Status New   Target Date 07/29/17               Plan - 06/24/17 1223    Clinical Impression Statement Patient tolerated treatment well. he was very sensative to light touch in the beggining of the treatment but his tolerance to light tocuh improved. Hereported no increase in pain with exercise ball work. Continue with manual therapy and modalities to reduce inflammation.    Clinical Presentation Stable   Clinical Decision Making Low   Rehab Potential Good   PT Frequency 2x / week   PT Duration 6 weeks   PT Treatment/Interventions ADLs/Self Care Home Management;Cryotherapy;Electrical Stimulation;Iontophoresis /ml Dexamethasone;Functional mobility training;Stair training;Gait training;Ultrasound;Traction;Moist Heat;Therapeutic activities;Therapeutic exercise;Neuromuscular re-education;Balance training;Patient/family education;Passive range of motion;Manual techniques;Dry needling;Taping   PT Next Visit Plan MMT, abdominal engagement, traction assess   PT Home Exercise Plan seated HSS, LE add with exhale for abdominal engagement, sleeping posture;    Consulted and Agree with Plan of Care Patient      Patient will benefit from skilled therapeutic intervention in order to improve the following deficits and impairments:  Decreased range of motion, Difficulty walking, Increased muscle spasms, Decreased activity tolerance, Pain, Improper body mechanics, Impaired flexibility, Hypomobility, Decreased strength, Decreased mobility, Impaired sensation, Postural dysfunction  Visit Diagnosis: Chronic bilateral low back pain with bilateral  sciatica     Problem List Patient Active Problem List   Diagnosis Date Noted  . Chronic pain of right knee 05/24/2017  . Unilateral primary osteoarthritis, right knee 05/24/2017  . Chronic bilateral low back pain with bilateral sciatica 12/08/2016    Dessie Coma 06/24/2017, 12:26 PM  [facility] Outpatient Rehabilitation [facility] [address] [place], [place], [address] Phone:  [phone]   Fax:  [phone]  Name: Stephen Scott MRN: [id number] Date of Birth: 1961-02-16

## 2017-06-27 ENCOUNTER — Encounter: Payer: BLUE CROSS/BLUE SHIELD | Admitting: Physical Therapy

## 2017-06-27 ENCOUNTER — Other Ambulatory Visit (INDEPENDENT_AMBULATORY_CARE_PROVIDER_SITE_OTHER): Payer: Self-pay | Admitting: Orthopaedic Surgery

## 2017-06-27 DIAGNOSIS — M1711 Unilateral primary osteoarthritis, right knee: Secondary | ICD-10-CM | POA: Diagnosis not present

## 2017-06-27 DIAGNOSIS — E7800 Pure hypercholesterolemia, unspecified: Secondary | ICD-10-CM | POA: Diagnosis not present

## 2017-06-27 DIAGNOSIS — F329 Major depressive disorder, single episode, unspecified: Secondary | ICD-10-CM | POA: Diagnosis not present

## 2017-06-27 DIAGNOSIS — E559 Vitamin D deficiency, unspecified: Secondary | ICD-10-CM | POA: Diagnosis not present

## 2017-06-27 DIAGNOSIS — I10 Essential (primary) hypertension: Secondary | ICD-10-CM | POA: Diagnosis not present

## 2017-06-27 DIAGNOSIS — M5136 Other intervertebral disc degeneration, lumbar region: Secondary | ICD-10-CM | POA: Diagnosis not present

## 2017-06-27 NOTE — Telephone Encounter (Signed)
Please advise 

## 2017-06-29 ENCOUNTER — Encounter: Payer: Self-pay | Admitting: Physical Therapy

## 2017-06-29 ENCOUNTER — Ambulatory Visit: Payer: BLUE CROSS/BLUE SHIELD | Attending: Orthopaedic Surgery | Admitting: Physical Therapy

## 2017-06-29 DIAGNOSIS — M5442 Lumbago with sciatica, left side: Secondary | ICD-10-CM | POA: Diagnosis not present

## 2017-06-29 DIAGNOSIS — M5441 Lumbago with sciatica, right side: Secondary | ICD-10-CM | POA: Diagnosis not present

## 2017-06-29 DIAGNOSIS — G8929 Other chronic pain: Secondary | ICD-10-CM | POA: Insufficient documentation

## 2017-06-29 NOTE — Therapy (Signed)
[facility] Outpatient Rehabilitation [facility] [address] [place], [place], [address] Phone: [phone]   Fax:  [phone]  Physical Therapy Treatment  Patient Details  Name: Stephen Scott MRN: [id number] Date of Birth: Sep 14, 1961 Referring Provider: Kathryne Hitch, MD  Encounter Date: 06/29/2017      PT End of Session - 06/29/17 0938    Visit Number 4   Number of Visits 13   Date for PT Re-Evaluation 07/29/17   Authorization Type BC[employer] 30 visit limit   PT Start Time 0936   PT Stop Time 1018   PT Time Calculation (min) 42 min   Activity Tolerance Patient tolerated treatment well   Behavior During Therapy [facility] for tasks assessed/performed      History reviewed. No pertinent past medical history.  Past Surgical History:  Procedure Laterality Date  . HAND SURGERY Left    1989, reconstructive  . KNEE SURGERY Right    2008  . MOUTH SURGERY     1980    There were no vitals filed for this visit.      Subjective Assessment - 06/29/17 0938    Subjective Pt reports back is not feeling good. Felt a little better after leaving treatments. Was very active over the weekend and was in dress shoes, has had to work nights also. Has been out of muscle relaxers for 4 days resulting in significant tightness and nerve pain.    Patient Stated Goals sit in car, soreness at night, lifting/moving   Currently in Pain? Yes   Pain Score 6   one tramadol this morning   Pain Location Back   Pain Orientation Lower                         [facility] Adult PT Treatment/Exercise - 06/29/17 0001      Therapeutic Activites    Therapeutic Activities Other Therapeutic Activities   Other Therapeutic Activities log roll/bed mobility     Exercises   Exercises Lumbar     Lumbar Exercises: Supine   Other Supine Lumbar Exercises iso clam blue band with GHJ flx with wand   Other Supine Lumbar Exercises UE roll ups, reaching to ceiling     Knee/Hip  Exercises: Stretches   Other Knee/Hip Stretches sciatic nerve glides     Knee/Hip Exercises: Supine   Other Supine Knee/Hip Exercises hooklying ball squeeze with pelvic tilt + breathing   Other Supine Knee/Hip Exercises hooklying clam with pelvic tilt                PT Education - 06/29/17 1022    Education provided Yes   Education Details discussion of rationale for core engagement & mckenzie exercises, importance of breathing with abdominal engagement   Person(s) Educated Patient   Methods Explanation;Demonstration;Tactile cues;Verbal cues;Handout   Comprehension Verbalized understanding;Returned demonstration;Verbal cues required;Tactile cues required;Need further instruction          PT Short Term Goals - 06/14/17 1028      PT SHORT TERM GOAL #1   Title Pt will verbalize ability to utilize abdominal engagement during resting postures to decrease back pain throughout the day   Baseline began educating at eval   Time 3   Period Weeks   Status New   Target Date 07/08/17     PT SHORT TERM GOAL #2   Title Pt will verbalize 50% improvement in overall pain during daily activities   Baseline severe pain at eval   Time  3   Period Weeks   Status New   Target Date 07/08/17           PT Long Term Goals - 06/14/17 1033      PT LONG TERM GOAL #1   Title FOTO to 52% limitation to indicate significant improvement in functional ability   Baseline 68% limitation at eval   Time 6   Period Weeks   Status New   Target Date 07/29/17     PT LONG TERM GOAL #2   Title Bilateral hip gross MMT to 5/5 without increased pain for support to lumbopelvic/LE biomechanical chain   Baseline see flowsheet, significant discomfort with active movement- MMT to be taken at next visit   Time 6   Period Weeks   Status New   Target Date 07/29/17     PT LONG TERM GOAL #3   Title Pt will be able to complete lifting activities at work when removing from hospital/morgue LBP <=3/10    Baseline severe pain at eval   Time 6   Period Weeks   Status New   Target Date 07/29/17     PT LONG TERM GOAL #4   Title Pt will be able to sit in the car for at least 45 min without limitation by LBP   Baseline 15-20 min when severe pain increases   Time 6   Period Weeks   Status New   Target Date 07/29/17     PT LONG TERM GOAL #5   Title Pt will be able to sleep without limitation by LBP   Baseline limited at eval   Time 6   Period Weeks   Status New   Target Date 07/29/17               Plan - 06/29/17 1019    Clinical Impression Statement Pt reported decrease in back pain following treatment. Exercises to focus on abdominal engagement and add in equal R/L hip activation. Significantly limited by R knee pain.    PT Treatment/Interventions ADLs/Self Care Home Management;Cryotherapy;Electrical Stimulation;Iontophoresis /ml Dexamethasone;Functional mobility training;Stair training;Gait training;Ultrasound;Traction;Moist Heat;Therapeutic activities;Therapeutic exercise;Neuromuscular re-education;Balance training;Patient/family education;Passive range of motion;Manual techniques;Dry needling;Taping   PT Next Visit Plan lumbopelvic strengthening/stabilization   PT Home Exercise Plan seated HSS, LE add with exhale for abdominal engagement, sleeping posture; hooklying ball squeeze & clam with abdominal engagement, sciatic nerve glide   Consulted and Agree with Plan of Care Patient      Patient will benefit from skilled therapeutic intervention in order to improve the following deficits and impairments:  Decreased range of motion, Difficulty walking, Increased muscle spasms, Decreased activity tolerance, Pain, Improper body mechanics, Impaired flexibility, Hypomobility, Decreased strength, Decreased mobility, Impaired sensation, Postural dysfunction  Visit Diagnosis: Chronic bilateral low back pain with bilateral sciatica     Problem List Patient Active Problem List    Diagnosis Date Noted  . Chronic pain of right knee 05/24/2017  . Unilateral primary osteoarthritis, right knee 05/24/2017  . Chronic bilateral low back pain with bilateral sciatica 12/08/2016   Yunior Jain C. Shanti Eichel PT, DPT 06/29/17 10:24 AM   [facility] Outpatient Rehabilitation [facility] [address] [place], [place], [address] Phone: [phone]   Fax:  [phone]  Name: Stephen Scott MRN: [id number] Date of Birth: [birth year]

## 2017-07-05 ENCOUNTER — Encounter: Payer: Self-pay | Admitting: Physical Therapy

## 2017-07-05 ENCOUNTER — Telehealth (INDEPENDENT_AMBULATORY_CARE_PROVIDER_SITE_OTHER): Payer: Self-pay | Admitting: Orthopaedic Surgery

## 2017-07-05 ENCOUNTER — Ambulatory Visit: Payer: BLUE CROSS/BLUE SHIELD | Admitting: Physical Therapy

## 2017-07-05 DIAGNOSIS — M5441 Lumbago with sciatica, right side: Principal | ICD-10-CM

## 2017-07-05 DIAGNOSIS — M5442 Lumbago with sciatica, left side: Secondary | ICD-10-CM | POA: Diagnosis not present

## 2017-07-05 DIAGNOSIS — G8929 Other chronic pain: Secondary | ICD-10-CM | POA: Diagnosis not present

## 2017-07-05 NOTE — Therapy (Signed)
[facility] Outpatient Rehabilitation [facility] [address] [place], [place], [address] Phone: [phone]   Fax:  [phone]  Physical Therapy Treatment  Patient Details  Name: Stephen Scott MRN: [id number] Date of Birth: 09/12/1961 Referring Provider: Kathryne Hitch, MD  Encounter Date: 07/05/2017      PT End of Session - 07/05/17 0851    Visit Number 5   Number of Visits 13   Date for PT Re-Evaluation 07/29/17   PT Start Time 0809  Patient arrived late   PT Stop Time 0906   PT Time Calculation (min) 57 min   Activity Tolerance Patient tolerated treatment well   Behavior During Therapy [facility] for tasks assessed/performed      History reviewed. No pertinent past medical history.  Past Surgical History:  Procedure Laterality Date  . HAND SURGERY Left    1989, reconstructive  . KNEE SURGERY Right    2008  . MOUTH SURGERY     1980    There were no vitals filed for this visit.      Subjective Assessment - 07/05/17 0813    Subjective He is trying to do the things the way he should.  He has seen temporary improvement  pain is not as severe.  He is taking less tramadol.  Base pain is less 7-9/10 vs 9-10 every day.   Currently in Pain? Yes   Pain Score 8    Pain Location Back   Pain Orientation Lower   Pain Descriptors / Indicators Aching   Pain Radiating Towards left leg pain   Pain Frequency Constant   Aggravating Factors  bending, lifting 40 LBS of mulch.     Pain Relieving Factors rest,  some exercises   Effect of Pain on Daily Activities tried to not lift as much at work   Multiple Pain Sites Yes  knee pain                         [facility] Adult PT Treatment/Exercise - 07/05/17 0001      Self-Care   Self-Care Posture   Posture avoid bending at waist to place things on mat.    Discussed stratigies for mulch./  lifting at work     Lumbar Exercises: Supine   Other Supine Lumbar Exercises iso clam green band with GHJ  flx with wand   Other Supine Lumbar Exercises UE roll ups, reaching to ceiling     Knee/Hip Exercises: Stretches   Passive Hamstring Stretch 3 reps;30 seconds   Piriformis Stretch Limitations 2 X 30  left   Other Knee/Hip Stretches sciatic nerve glides  and flossing     Modalities   Modalities Moist Heat     Moist Heat Therapy   Number Minutes Moist Heat 15 Minutes   Moist Heat Location Lumbar Spine                PT Education - 07/05/17 0851    Education provided Yes   Education Details Body mechanics   Person(s) Educated Patient   Methods Explanation;Demonstration   Comprehension Verbalized understanding          PT Short Term Goals - 06/14/17 1028      PT SHORT TERM GOAL #1   Title Pt will verbalize ability to utilize abdominal engagement during resting postures to decrease back pain throughout the day   Baseline began educating at eval   Time 3   Period Weeks   Status New  Target Date 07/08/17     PT SHORT TERM GOAL #2   Title Pt will verbalize 50% improvement in overall pain during daily activities   Baseline severe pain at eval   Time 3   Period Weeks   Status New   Target Date 07/08/17           PT Long Term Goals - 06/14/17 1033      PT LONG TERM GOAL #1   Title FOTO to 52% limitation to indicate significant improvement in functional ability   Baseline 68% limitation at eval   Time 6   Period Weeks   Status New   Target Date 07/29/17     PT LONG TERM GOAL #2   Title Bilateral hip gross MMT to 5/5 without increased pain for support to lumbopelvic/LE biomechanical chain   Baseline see flowsheet, significant discomfort with active movement- MMT to be taken at next visit   Time 6   Period Weeks   Status New   Target Date 07/29/17     PT LONG TERM GOAL #3   Title Pt will be able to complete lifting activities at work when removing from hospital/morgue LBP <=3/10   Baseline severe pain at eval   Time 6   Period Weeks   Status New    Target Date 07/29/17     PT LONG TERM GOAL #4   Title Pt will be able to sit in the car for at least 45 min without limitation by LBP   Baseline 15-20 min when severe pain increases   Time 6   Period Weeks   Status New   Target Date 07/29/17     PT LONG TERM GOAL #5   Title Pt will be able to sleep without limitation by LBP   Baseline limited at eval   Time 6   Period Weeks   Status New   Target Date 07/29/17               Plan - 07/05/17 0912    Clinical Impression Statement Moist heat helpful. Patient had a lot on his mind today.  Exercise focus.  Pain improved at end of session.     PT Next Visit Plan lumbopelvic strengthening/stabilization   Consulted and Agree with Plan of Care Patient      Patient will benefit from skilled therapeutic intervention in order to improve the following deficits and impairments:     Visit Diagnosis: Chronic bilateral low back pain with bilateral sciatica     Problem List Patient Active Problem List   Diagnosis Date Noted  . Chronic pain of right knee 05/24/2017  . Unilateral primary osteoarthritis, right knee 05/24/2017  . Chronic bilateral low back pain with bilateral sciatica 12/08/2016    Tylor Courtwright PTA 07/05/2017, 1:25 PM  [facility] [address] [place], [place], [address] Phone: [phone]   Fax:  [phone]  Name: Stephen Scott MRN: [id number] Date of Birth: [birth year]

## 2017-07-05 NOTE — Telephone Encounter (Signed)
Patient called asking about the hyaluronic acid injection, he was just wondering if it's been ordered yet? CB # [phone]

## 2017-07-05 NOTE — Telephone Encounter (Signed)
Yes, this has been ordered and will be available at his appointment later this month

## 2017-07-08 ENCOUNTER — Ambulatory Visit: Payer: BLUE CROSS/BLUE SHIELD | Admitting: Physical Therapy

## 2017-07-08 DIAGNOSIS — M5441 Lumbago with sciatica, right side: Principal | ICD-10-CM

## 2017-07-08 DIAGNOSIS — M5442 Lumbago with sciatica, left side: Principal | ICD-10-CM

## 2017-07-08 DIAGNOSIS — G8929 Other chronic pain: Secondary | ICD-10-CM | POA: Diagnosis not present

## 2017-07-08 NOTE — Therapy (Signed)
[place], [place], [address] Phone: [phone]   Fax:  [phone]  Physical Therapy Treatment  Patient Details  Name: Stephen Scott MRN: [id number] Date of Birth: 1961-05-04 Referring Provider: Mcarthur Rossetti, MD  Encounter Date: 07/08/2017      PT End of Session - 07/08/17 0813    Visit Number 6   Number of Visits 13   Date for PT Re-Evaluation 07/29/17   Authorization Type BCBS 30 visit limit   PT Start Time 0808   PT Stop Time 0901   PT Time Calculation (min) 53 min      No past medical history on file.  Past Surgical History:  Procedure Laterality Date  . HAND SURGERY Left    1989, reconstructive  . KNEE SURGERY Right    2008  . MOUTH SURGERY     1980    There were no vitals filed for this visit.      Subjective Assessment - 07/08/17 0811    Subjective Back is not feeling very good today. I have not done my exercises. I had to do some lifting that I could  not avoid.    Currently in Pain? Yes   Pain Score 7    Pain Location Back   Pain Orientation Lower   Pain Descriptors / Indicators Aching                         [facility] Adult PT Treatment/Exercise - 07/08/17 0001      Lumbar Exercises: Aerobic   Stationary Bike Nustep L 5 UE/LE x 5 minutes      Lumbar Exercises: Supine   Clam 20 reps   Clam Limitations with abdominal draw in    Bent Knee Raise 10 reps   Bent Knee Raise Limitations with abdominal draw in    Straight Leg Raise 10 reps   Straight Leg Raises Limitations with abdominal draw in    Other Supine Lumbar Exercises UE roll ups, reaching to ceiling     Knee/Hip Exercises: Stretches   Passive Hamstring Stretch 3 reps;30 seconds     Knee/Hip Exercises: Supine   Other Supine Knee/Hip Exercises hooklying ball squeeze with pelvic tilt + breathing     Modalities   Modalities Moist Heat     Moist Heat Therapy   Number Minutes Moist Heat  15 Minutes   Moist Heat Location Lumbar Spine                  PT Short Term Goals - 07/08/17 0826      PT SHORT TERM GOAL #1   Title Pt will verbalize ability to utilize abdominal engagement during resting postures to decrease back pain throughout the day   Baseline trying it, unsure if helpful yet    Time 3   Period Weeks   Status On-going     PT SHORT TERM GOAL #2   Title Pt will verbalize 50% improvement in overall pain during daily activities   Baseline notes some improvement     Time 3   Period Weeks   Status Partially Met           PT Long Term Goals - 06/14/17 1033      PT LONG TERM GOAL #1   Title FOTO to 52% limitation to indicate significant improvement in functional ability   Baseline 68% limitation at eval   Time 6   Period Weeks  Status New   Target Date 07/29/17     PT LONG TERM GOAL #2   Title Bilateral hip gross MMT to 5/5 without increased pain for support to lumbopelvic/LE biomechanical chain   Baseline see flowsheet, significant discomfort with active movement- MMT to be taken at next visit   Time 6   Period Weeks   Status New   Target Date 07/29/17     PT LONG TERM GOAL #3   Title Pt will be able to complete lifting activities at work when removing from hospital/morgue LBP <=3/10   Baseline severe pain at eval   Time 6   Period Weeks   Status New   Target Date 07/29/17     PT LONG TERM GOAL #4   Title Pt will be able to sit in the car for at least 45 min without limitation by LBP   Baseline 15-20 min when severe pain increases   Time 6   Period Weeks   Status New   Target Date 07/29/17     PT LONG TERM GOAL #5   Title Pt will be able to sleep without limitation by LBP   Baseline limited at eval   Time 6   Period Weeks   Status New   Target Date 07/29/17               Plan - 07/08/17 4585    Clinical Impression Statement Pt reports he notices a lot more fluidity  with daily activities this week. He is trying  to engage abdominals with lifting at work. Progressing toward STGs. We Focused core strength and he tolerated well. Repeated HMP as pt reported it was helpful last visit.    PT Next Visit Plan lumbopelvic strengthening/stabilization   PT Home Exercise Plan seated HSS, LE add with exhale for abdominal engagement, sleeping posture; hooklying ball squeeze & clam with abdominal engagement, sciatic nerve glide   Consulted and Agree with Plan of Care Patient      Patient will benefit from skilled therapeutic intervention in order to improve the following deficits and impairments:  Decreased range of motion, Difficulty walking, Increased muscle spasms, Decreased activity tolerance, Pain, Improper body mechanics, Impaired flexibility, Hypomobility, Decreased strength, Decreased mobility, Impaired sensation, Postural dysfunction  Visit Diagnosis: Chronic bilateral low back pain with bilateral sciatica     Problem List Patient Active Problem List   Diagnosis Date Noted  . Chronic pain of right knee 05/24/2017  . Unilateral primary osteoarthritis, right knee 05/24/2017  . Chronic bilateral low back pain with bilateral sciatica 12/08/2016    Dorene Ar, PTA 07/08/2017, 9:16 AM  [place], [place], [address] Phone: [phone]   Fax:  [phone]  Name: Lopaka Karge MRN: [id number] Date of Birth: June 30, 1961

## 2017-07-11 ENCOUNTER — Encounter: Payer: Self-pay | Admitting: Physical Therapy

## 2017-07-11 ENCOUNTER — Ambulatory Visit: Payer: BLUE CROSS/BLUE SHIELD | Admitting: Physical Therapy

## 2017-07-11 DIAGNOSIS — G8929 Other chronic pain: Secondary | ICD-10-CM

## 2017-07-11 DIAGNOSIS — M5441 Lumbago with sciatica, right side: Principal | ICD-10-CM

## 2017-07-11 DIAGNOSIS — M5442 Lumbago with sciatica, left side: Principal | ICD-10-CM

## 2017-07-11 NOTE — Therapy (Signed)
[place], [place], [address] Phone: [phone]   Fax:  [phone]  Physical Therapy Treatment  Patient Details  Name: Stephen Scott MRN: [id number] Date of Birth: June 04, 1961 Referring Provider: Mcarthur Rossetti, MD  Encounter Date: 07/11/2017      PT End of Session - 07/11/17 1050    Visit Number 7   Number of Visits 13   Date for PT Re-Evaluation 07/29/17   PT Start Time 0814  short session patient 14 minutes late   PT Stop Time 0900   PT Time Calculation (min) 46 min   Activity Tolerance Patient tolerated treatment well;Patient limited by pain  knee right limits exercises   Behavior During Therapy [facility] for tasks assessed/performed      History reviewed. No pertinent past medical history.  Past Surgical History:  Procedure Laterality Date  . HAND SURGERY Left    1989, reconstructive  . KNEE SURGERY Right    2008  . MOUTH SURGERY     1980    There were no vitals filed for this visit.      Subjective Assessment - 07/11/17 0817    Subjective I wake up stiff every morning.  Eversince i started the meds and the exercises.  I am more limber in the evenings.  People have noticed I am walking better.  I am moving more fluid.    Currently in Pain? Yes   Pain Score 5    Pain Location Back   Pain Orientation Lower   Pain Descriptors / Indicators Aching  stiff   Pain Frequency Intermittent   Aggravating Factors  moving and aggravating the nerve,  stepping wrong.     Pain Relieving Factors new meds,  exercises   Multiple Pain Sites --  right knee pain up to 10/10                         [facility] Adult PT Treatment/Exercise - 07/11/17 0001      Lumbar Exercises: Aerobic   Stationary Bike Nustep L 5 UE/LE x 5 minutes      Lumbar Exercises: Standing   Other Standing Lumbar Exercises mini squat small motions 10 X moderate cues for technique,  Limited by knee pain.     Lumbar Exercises: Supine   Ab Set 5 reps   AB Set Limitations cues for breatrhing vs breath holding   Bridge 10 reps   Bridge Limitations legs on ball to keep knees protected.    Straight Leg Raise 10 reps  left .  legs on ball to decrease the challange to right knee   Straight Leg Raises Limitations left 8-9/10  limited by right knee pain   Other Supine Lumbar Exercises iso clam blue band with GHJ flx with wand  10 X,  mod cues to keep low back tight to table.   Other Supine Lumbar Exercises UE roll ups, reaching to ceiling  10 x     Knee/Hip Exercises: Standing   Functional Squat 10 reps   Functional Squat Limitations holding at sink. heavy cues.  (9+/10 knee pain)  small movements.     Modalities   Modalities Moist Heat     Moist Heat Therapy   Number Minutes Moist Heat 15 Minutes   Moist Heat Location Lumbar Spine                  PT Short Term Goals - 07/11/17 1056  PT SHORT TERM GOAL #1   Title Pt will verbalize ability to utilize abdominal engagement during resting postures to decrease back pain throughout the day   Time 3   Period Weeks   Status Unable to assess     PT SHORT TERM GOAL #2   Title Pt will verbalize 50% improvement in overall pain during daily activities   Baseline notes some improvement  Hard to get a %.     Time 3   Period Weeks   Status Partially Met           PT Long Term Goals - 06/14/17 1033      PT LONG TERM GOAL #1   Title FOTO to 52% limitation to indicate significant improvement in functional ability   Baseline 68% limitation at eval   Time 6   Period Weeks   Status New   Target Date 07/29/17     PT LONG TERM GOAL #2   Title Bilateral hip gross MMT to 5/5 without increased pain for support to lumbopelvic/LE biomechanical chain   Baseline see flowsheet, significant discomfort with active movement- MMT to be taken at next visit   Time 6   Period Weeks   Status New   Target Date 07/29/17     PT LONG TERM GOAL  #3   Title Pt will be able to complete lifting activities at work when removing from hospital/morgue LBP <=3/10   Baseline severe pain at eval   Time 6   Period Weeks   Status New   Target Date 07/29/17     PT LONG TERM GOAL #4   Title Pt will be able to sit in the car for at least 45 min without limitation by LBP   Baseline 15-20 min when severe pain increases   Time 6   Period Weeks   Status New   Target Date 07/29/17     PT LONG TERM GOAL #5   Title Pt will be able to sleep without limitation by LBP   Baseline limited at eval   Time 6   Period Weeks   Status New   Target Date 07/29/17               Plan - 07/11/17 0831    Clinical Impression Statement Leg pain decreased in frequency from every 20 minutes around the clock to 3 to 4 x a day.  Back pain decreased to 4/10 post exercise and he had no knee or back pain at rest while on heat at end of session.  Short session due to patient was late.Patient making good gains  with pain.  He continues to need encouragement.   PT Treatment/Interventions ADLs/Self Care Home Management;Cryotherapy;Electrical Stimulation;Iontophoresis '4mg'$ /ml Dexamethasone;Functional mobility training;Stair training;Gait training;Ultrasound;Traction;Moist Heat;Therapeutic activities;Therapeutic exercise;Neuromuscular re-education;Balance training;Patient/family education;Passive range of motion;Manual techniques;Dry needling;Taping   PT Next Visit Plan lumbopelvic strengthening/stabilization, update HEP if ready.   PT Home Exercise Plan seated HSS, LE add with exhale for abdominal engagement, sleeping posture; hooklying ball squeeze & clam with abdominal engagement, sciatic nerve glide   Consulted and Agree with Plan of Care Patient      Patient will benefit from skilled therapeutic intervention in order to improve the following deficits and impairments:  Decreased range of motion, Difficulty walking, Increased muscle spasms, Decreased activity  tolerance, Pain, Improper body mechanics, Impaired flexibility, Hypomobility, Decreased strength, Decreased mobility, Impaired sensation, Postural dysfunction  Visit Diagnosis: Chronic bilateral low back pain with bilateral sciatica     Problem  List Patient Active Problem List   Diagnosis Date Noted  . Chronic pain of right knee 05/24/2017  . Unilateral primary osteoarthritis, right knee 05/24/2017  . Chronic bilateral low back pain with bilateral sciatica 12/08/2016    [facility] PTA 07/11/2017, 10:58 AM  [place], [place], [address] Phone: [phone]   Fax:  [phone]  Name: Stephen Scott MRN: [id number] Date of Birth: [birth year]

## 2017-07-14 ENCOUNTER — Ambulatory Visit: Payer: BLUE CROSS/BLUE SHIELD | Admitting: Physical Therapy

## 2017-07-14 ENCOUNTER — Encounter: Payer: Self-pay | Admitting: Physical Therapy

## 2017-07-14 DIAGNOSIS — M5442 Lumbago with sciatica, left side: Secondary | ICD-10-CM | POA: Diagnosis not present

## 2017-07-14 DIAGNOSIS — M5441 Lumbago with sciatica, right side: Principal | ICD-10-CM

## 2017-07-14 DIAGNOSIS — G8929 Other chronic pain: Secondary | ICD-10-CM

## 2017-07-14 NOTE — Therapy (Signed)
[place], [place], [address] Phone: [phone]   Fax:  [phone]  Physical Therapy Treatment  Patient Details  Name: Stephen Scott MRN: [id number] Date of Birth: [birth year] Referring Provider: Mcarthur Rossetti, MD  Encounter Date: 07/14/2017      PT End of Session - 07/14/17 0833    Visit Number 8   Number of Visits 13   Date for PT Re-Evaluation 07/29/17   Authorization Type [employer] 30 visit limit   PT Start Time 0813   PT Stop Time 0900   PT Time Calculation (min) 47 min   Activity Tolerance Patient tolerated treatment well;Patient limited by pain   Behavior During Therapy [facility] for tasks assessed/performed      History reviewed. No pertinent past medical history.  Past Surgical History:  Procedure Laterality Date  . HAND SURGERY Left    1989, reconstructive  . KNEE SURGERY Right    2008  . MOUTH SURGERY     1980    There were no vitals filed for this visit.      Subjective Assessment - 07/14/17 0828    Subjective Patient reports the pain in hte back has improved. His knee is hurting him very bad this morning. He feels like his back hurts him when he is lifting items at work.    How long can you sit comfortably? 15-20 min   Patient Stated Goals sit in car, soreness at night, lifting/moving   Currently in Pain? Yes   Pain Score 4    Pain Location Back   Pain Orientation Lower   Pain Descriptors / Indicators Aching   Pain Radiating Towards into the buttock    Pain Onset More than a month ago   Pain Frequency Intermittent   Aggravating Factors  lifting    Pain Relieving Factors new meds, exercises    Effect of Pain on Daily Activities difficulty lifting at work                          [facility] Adult PT Treatment/Exercise - 07/14/17 0001      Lumbar Exercises: Stretches   Passive Hamstring Stretch Limitations with towel 2x20 sec hold    Lower Trunk Rotation  Limitations x10   Piriformis Stretch Limitations 3x20 sec hold      Lumbar Exercises: Supine   Clam 20 reps   Clam Limitations with abdominal draw in    Bent Knee Raise 10 reps   Bent Knee Raise Limitations with abdominal draw in    Bridge 10 reps   Bridge Limitations legs on ball to keep knees protected.    Straight Leg Raise 10 reps  left .  legs on ball to decrease the challange to right knee   Straight Leg Raises Limitations left 8-9/10  limited by right knee pain   Other Supine Lumbar Exercises UE roll ups, reaching to ceiling  10 x     Modalities   Modalities Moist Heat     Moist Heat Therapy   Number Minutes Moist Heat 15 Minutes   Moist Heat Location Lumbar Spine                PT Education - 07/14/17 0833    Education provided Yes   Education Details reviewed the improtance of stretching and technique with stretching    Person(s) Educated Patient   Methods Explanation;Demonstration   Comprehension Verbalized understanding;Returned demonstration;Verbal cues required  PT Short Term Goals - 07/14/17 0841      PT SHORT TERM GOAL #1   Title Pt will verbalize ability to utilize abdominal engagement during resting postures to decrease back pain throughout the day   Baseline trying it, unsure if helpful yet    Time 3   Period Weeks   Status Unable to assess     PT SHORT TERM GOAL #2   Title Pt will verbalize 50% improvement in overall pain during daily activities   Baseline notes some improvement  Hard to get a %.     Time 3   Period Weeks   Status Partially Met           PT Long Term Goals - 06/14/17 1033      PT LONG TERM GOAL #1   Title FOTO to 52% limitation to indicate significant improvement in functional ability   Baseline 68% limitation at eval   Time 6   Period Weeks   Status New   Target Date 07/29/17     PT LONG TERM GOAL #2   Title Bilateral hip gross MMT to 5/5 without increased pain for support to lumbopelvic/LE  biomechanical chain   Baseline see flowsheet, significant discomfort with active movement- MMT to be taken at next visit   Time 6   Period Weeks   Status New   Target Date 07/29/17     PT LONG TERM GOAL #3   Title Pt will be able to complete lifting activities at work when removing from hospital/morgue LBP <=3/10   Baseline severe pain at eval   Time 6   Period Weeks   Status New   Target Date 07/29/17     PT LONG TERM GOAL #4   Title Pt will be able to sit in the car for at least 45 min without limitation by LBP   Baseline 15-20 min when severe pain increases   Time 6   Period Weeks   Status New   Target Date 07/29/17     PT LONG TERM GOAL #5   Title Pt will be able to sleep without limitation by LBP   Baseline limited at eval   Time 6   Period Weeks   Status New   Target Date 07/29/17               Plan - 07/14/17 0834    Clinical Impression Statement Patient reuqired min cuing for technique with stretches. He was encouraged to use his stretches in the morning. He tolerated ther-ex well. Patient was 14 minutes late for his appointment.,    Clinical Presentation Stable   Clinical Decision Making Low   Rehab Potential Good   PT Frequency 2x / week   PT Duration 6 weeks   PT Treatment/Interventions ADLs/Self Care Home Management;Cryotherapy;Electrical Stimulation;Iontophoresis 24m/ml Dexamethasone;Functional mobility training;Stair training;Gait training;Ultrasound;Traction;Moist Heat;Therapeutic activities;Therapeutic exercise;Neuromuscular re-education;Balance training;Patient/family education;Passive range of motion;Manual techniques;Dry needling;Taping   PT Next Visit Plan lumbopelvic strengthening/stabilization, update HEP if ready.   PT Home Exercise Plan seated HSS, LE add with exhale for abdominal engagement, sleeping posture; hooklying ball squeeze & clam with abdominal engagement, sciatic nerve glide   Consulted and Agree with Plan of Care Patient       Patient will benefit from skilled therapeutic intervention in order to improve the following deficits and impairments:  Decreased range of motion, Difficulty walking, Increased muscle spasms, Decreased activity tolerance, Pain, Improper body mechanics, Impaired flexibility, Hypomobility, Decreased strength, Decreased mobility, Impaired sensation,  Postural dysfunction  Visit Diagnosis: Chronic bilateral low back pain with bilateral sciatica     Problem List Patient Active Problem List   Diagnosis Date Noted  . Chronic pain of right knee 05/24/2017  . Unilateral primary osteoarthritis, right knee 05/24/2017  . Chronic bilateral low back pain with bilateral sciatica 12/08/2016    Carney Living PT DPT  07/14/2017, 3:32 PM  [facility] [address] [place], [place], [address] Phone: [phone]   Fax:  [phone]  Name: Jerimiah Wolman MRN: [id number] Date of Birth: 1960/12/31

## 2017-07-20 ENCOUNTER — Ambulatory Visit (INDEPENDENT_AMBULATORY_CARE_PROVIDER_SITE_OTHER): Payer: BLUE CROSS/BLUE SHIELD | Admitting: Orthopaedic Surgery

## 2017-07-20 ENCOUNTER — Encounter: Payer: Self-pay | Admitting: Physical Therapy

## 2017-07-20 ENCOUNTER — Ambulatory Visit: Payer: BLUE CROSS/BLUE SHIELD | Admitting: Physical Therapy

## 2017-07-20 DIAGNOSIS — M5442 Lumbago with sciatica, left side: Secondary | ICD-10-CM | POA: Diagnosis not present

## 2017-07-20 DIAGNOSIS — M1711 Unilateral primary osteoarthritis, right knee: Secondary | ICD-10-CM | POA: Diagnosis not present

## 2017-07-20 DIAGNOSIS — M5441 Lumbago with sciatica, right side: Principal | ICD-10-CM

## 2017-07-20 DIAGNOSIS — G8929 Other chronic pain: Secondary | ICD-10-CM | POA: Diagnosis not present

## 2017-07-20 MED ORDER — HYLAN G-F 20 48 MG/6ML IX SOSY
48.0000 mg | PREFILLED_SYRINGE | INTRA_ARTICULAR | Status: AC | PRN
  Administered 2017-07-20: 48 mg via INTRA_ARTICULAR

## 2017-07-20 MED ORDER — LIDOCAINE HCL 1 % IJ SOLN
3.0000 mL | INTRAMUSCULAR | Status: AC | PRN
  Administered 2017-07-20: 3 mL

## 2017-07-20 NOTE — Progress Notes (Signed)
   Procedure Note  Patient: Stephen Scott             Date of Birth: [birth year]           MRN: [id number]             Visit Date: 07/20/2017  Procedures: Visit Diagnoses: Unilateral primary osteoarthritis, right knee  Large Joint Inj Date/Time: 07/20/2017 10:21 AM Performed by: Kathryne HitchBLACKMAN, Maleek Craver Y Authorized by: Kathryne HitchBLACKMAN, Montie Gelardi Y   Location:  Knee Site:  R knee Ultrasound Guidance: No   Fluoroscopic Guidance: No   Arthrogram: No   Medications:  3 mL lidocaine 1 %; 48 mg Hylan 48 MG/6ML    Stephen Scott is well-known to me.  He is actually 1 of my neighbors.  He has moderate osteoarthritis and degenerative joint disease of his right knee.  He has had a hyaluronic acid before earlier this year.  He has had steroid injections in between he would like to have another hyaluronic acid today.  We have already had this scheduled and approved for Synvisc 1.  He is doing well overall.  He does feel that he may be heading toward a knee replacement if this does not help.  He has been going through physical therapy to work on his back and core strengthening as well as strengthening his quads.  He tolerated the injection well.  We will follow-up as needed but being unable he knows to contact me about what the next steps may be.

## 2017-07-20 NOTE — Therapy (Signed)
[place], [place], [address] Phone: [phone]   Fax:  [phone]  Physical Therapy Treatment  Patient Details  Name: Stephen Scott MRN: [id number] Date of Birth: 1960/10/13 Referring Provider: Mcarthur Rossetti, MD  Encounter Date: 07/20/2017      PT End of Session - 07/20/17 0816    Visit Number 9   Number of Visits 13   Date for PT Re-Evaluation 07/29/17   Authorization Type [employer] 30 visit limit   PT Start Time 0814  pt arrived late   PT Stop Time 0855   PT Time Calculation (min) 41 min   Activity Tolerance Patient tolerated treatment well   Behavior During Therapy [facility] for tasks assessed/performed      History reviewed. No pertinent past medical history.  Past Surgical History:  Procedure Laterality Date  . HAND SURGERY Left    1989, reconstructive  . KNEE SURGERY Right    2008  . MOUTH SURGERY     1980    There were no vitals filed for this visit.      Subjective Assessment - 07/20/17 0816    Subjective Is getting another injection in knee today. Pt reports back pain overall is getting better, still has flare ups. Continues to be limited by knee pain.    Patient Stated Goals sit in car, soreness at night, lifting/moving                         [facility] Adult PT Treatment/Exercise - 07/20/17 0001      Therapeutic Activites    Other Therapeutic Activities lifting technique     Lumbar Exercises: Stretches   Single Knee to Chest Stretch 2 reps;10 seconds   Lower Trunk Rotation Limitations x2 each     Lumbar Exercises: Aerobic   Stationary Bike Nustep L 5 UE/LE x 5 minutes      Lumbar Exercises: Standing   Other Standing Lumbar Exercises --     Knee/Hip Exercises: Stretches   Passive Hamstring Stretch Limitations seated EOB     Knee/Hip Exercises: Prone   Hip Extension Both;20 reps   Hip Extension Limitations with iso HS curl     Modalities   Modalities Cryotherapy     Moist Heat Therapy   Number Minutes Moist Heat 10 Minutes   Moist Heat Location Lumbar Spine     Cryotherapy   Number Minutes Cryotherapy 10 Minutes  with heat   Cryotherapy Location Knee  Rt   Type of Cryotherapy Ice pack                  PT Short Term Goals - 07/14/17 0841      PT SHORT TERM GOAL #1   Title Pt will verbalize ability to utilize abdominal engagement during resting postures to decrease back pain throughout the day   Baseline trying it, unsure if helpful yet    Time 3   Period Weeks   Status Unable to assess     PT SHORT TERM GOAL #2   Title Pt will verbalize 50% improvement in overall pain during daily activities   Baseline notes some improvement  Hard to get a %.     Time 3   Period Weeks   Status Partially Met           PT Long Term Goals - 06/14/17 1033      PT LONG TERM GOAL #1   Title FOTO  to 52% limitation to indicate significant improvement in functional ability   Baseline 68% limitation at eval   Time 6   Period Weeks   Status New   Target Date 07/29/17     PT LONG TERM GOAL #2   Title Bilateral hip gross MMT to 5/5 without increased pain for support to lumbopelvic/LE biomechanical chain   Baseline see flowsheet, significant discomfort with active movement- MMT to be taken at next visit   Time 6   Period Weeks   Status New   Target Date 07/29/17     PT LONG TERM GOAL #3   Title Pt will be able to complete lifting activities at work when removing from hospital/morgue LBP <=3/10   Baseline severe pain at eval   Time 6   Period Weeks   Status New   Target Date 07/29/17     PT LONG TERM GOAL #4   Title Pt will be able to sit in the car for at least 45 min without limitation by LBP   Baseline 15-20 min when severe pain increases   Time 6   Period Weeks   Status New   Target Date 07/29/17     PT LONG TERM GOAL #5   Title Pt will be able to sleep without limitation by LBP   Baseline limited  at eval   Time 6   Period Weeks   Status New   Target Date 07/29/17               Plan - 07/20/17 0849    Clinical Impression Statement Worked on lifting technique today which pt tolerated well. It was painful for his knee but I asked that he perform with good form which will cause knee pain rather than compensate and cause knee & back pain. Is making progress reporting overall decrease in pain and improvement in function, has been able to decrease lifting at work which is also helping.    PT Treatment/Interventions ADLs/Self Care Home Management;Cryotherapy;Electrical Stimulation;Iontophoresis '4mg'$ /ml Dexamethasone;Functional mobility training;Stair training;Gait training;Ultrasound;Traction;Moist Heat;Therapeutic activities;Therapeutic exercise;Neuromuscular re-education;Balance training;Patient/family education;Passive range of motion;Manual techniques;Dry needling;Taping   PT Next Visit Plan lumbopelvic strengthening/stabilization, update HEP if ready.   PT Home Exercise Plan seated HSS, LE add with exhale for abdominal engagement, sleeping posture; hooklying ball squeeze & clam with abdominal engagement, sciatic nerve glide   Consulted and Agree with Plan of Care Patient      Patient will benefit from skilled therapeutic intervention in order to improve the following deficits and impairments:  Decreased range of motion, Difficulty walking, Increased muscle spasms, Decreased activity tolerance, Pain, Improper body mechanics, Impaired flexibility, Hypomobility, Decreased strength, Decreased mobility, Impaired sensation, Postural dysfunction  Visit Diagnosis: Chronic bilateral low back pain with bilateral sciatica     Problem List Patient Active Problem List   Diagnosis Date Noted  . Chronic pain of right knee 05/24/2017  . Unilateral primary osteoarthritis, right knee 05/24/2017  . Chronic bilateral low back pain with bilateral sciatica 12/08/2016    Stephen Scott PT,  DPT 07/20/17 8:53 AM   [facility] [address] [place], [place], [address] Phone: [phone]   Fax:  [phone]  Name: Stephen Scott MRN: [id number] Date of Birth: June 09, 1961

## 2017-07-21 ENCOUNTER — Ambulatory Visit: Payer: BLUE CROSS/BLUE SHIELD | Admitting: Physical Therapy

## 2017-07-21 ENCOUNTER — Telehealth: Payer: Self-pay | Admitting: Physical Therapy

## 2017-07-21 NOTE — Telephone Encounter (Signed)
Called patient about missed visit. " It completely slipped my mind"  He offered to come later however there were no openings this morning on my schedule.  He was informed of the next appointment on the 30 th at 8:00 am. Liz BeachKaren Harris PTA

## 2017-07-26 ENCOUNTER — Ambulatory Visit: Payer: BLUE CROSS/BLUE SHIELD | Admitting: Physical Therapy

## 2017-07-26 ENCOUNTER — Encounter: Payer: Self-pay | Admitting: Physical Therapy

## 2017-07-26 DIAGNOSIS — M5442 Lumbago with sciatica, left side: Secondary | ICD-10-CM | POA: Diagnosis not present

## 2017-07-26 DIAGNOSIS — M5441 Lumbago with sciatica, right side: Secondary | ICD-10-CM | POA: Diagnosis not present

## 2017-07-26 DIAGNOSIS — G8929 Other chronic pain: Secondary | ICD-10-CM | POA: Diagnosis not present

## 2017-07-26 NOTE — Therapy (Signed)
Stephen Scott, [place], [address] Phone: [phone]   Fax:  [phone]  Physical Therapy Treatment  Patient Details  Name: Stephen Scott MRN: [id number] Date of Birth: Apr 11, 1961 Referring Provider: Mcarthur Rossetti, MD  Encounter Date: 07/26/2017      PT End of Session - 07/26/17 0817    Visit Number 10   Number of Visits 13   Date for PT Re-Evaluation 07/29/17   Authorization Type [employer] 30 visit limit   PT Start Time 0816  pt arrived late   PT Stop Time 0848   PT Time Calculation (min) 32 min   Activity Tolerance Patient tolerated treatment well   Behavior During Therapy [facility] for tasks assessed/performed      History reviewed. No pertinent past medical history.  Past Surgical History:  Procedure Laterality Date  . HAND SURGERY Left    1989, reconstructive  . KNEE SURGERY Right    2008  . MOUTH SURGERY     1980    There were no vitals filed for this visit.      Subjective Assessment - 07/26/17 0817    Subjective Was in [place] this weekend and did a lot of walking on uneven grounds. Knee did okay, back was sore after driving that distance and sitting in a theatre with small seats. Pt reports he has an enlarged nerve on the left side of his body which causes increased pain on the left.    Patient Stated Goals sit in car, soreness at night, lifting/moving                         [facility] Adult PT Treatment/Exercise - 07/26/17 0001      Lumbar Exercises: Stretches   Passive Hamstring Stretch Limitations seated EOB   Lower Trunk Rotation Limitations seated rotation stretch     Lumbar Exercises: Aerobic   Stationary Bike Nustep L 5 UE/LE x 5 minutes      Lumbar Exercises: Seated   Other Seated Lumbar Exercises seated marching, seated resisted hip abd with UE     Lumbar Exercises: Supine   Bent Knee Raise Limitations with core, alternating marches   Straight Leg  Raises Limitations bilat without touching table, core engagement                  PT Short Term Goals - 07/14/17 0841      PT SHORT TERM GOAL #1   Title Pt will verbalize ability to utilize abdominal engagement during resting postures to decrease back pain throughout the day   Baseline trying it, unsure if helpful yet    Time 3   Period Weeks   Status Unable to assess     PT SHORT TERM GOAL #2   Title Pt will verbalize 50% improvement in overall pain during daily activities   Baseline notes some improvement  Hard to get a %.     Time 3   Period Weeks   Status Partially Met           PT Long Term Goals - 06/14/17 1033      PT LONG TERM GOAL #1   Title FOTO to 52% limitation to indicate significant improvement in functional ability   Baseline 68% limitation at eval   Time 6   Period Weeks   Status New   Target Date 07/29/17     PT LONG TERM GOAL #2   Title Bilateral hip  gross MMT to 5/5 without increased pain for support to lumbopelvic/LE biomechanical chain   Baseline see flowsheet, significant discomfort with active movement- MMT to be taken at next visit   Time 6   Period Weeks   Status New   Target Date 07/29/17     PT LONG TERM GOAL #3   Title Pt will be able to complete lifting activities at work when removing from hospital/morgue LBP <=3/10   Baseline severe pain at eval   Time 6   Period Weeks   Status New   Target Date 07/29/17     PT LONG TERM GOAL #4   Title Pt will be able to sit in the car for at least 45 min without limitation by LBP   Baseline 15-20 min when severe pain increases   Time 6   Period Weeks   Status New   Target Date 07/29/17     PT LONG TERM GOAL #5   Title Pt will be able to sleep without limitation by LBP   Baseline limited at eval   Time 6   Period Weeks   Status New   Target Date 07/29/17               Plan - 07/26/17 7867    Clinical Impression Statement discussed transitioning exercises to seated  position so he can do them at work due to difficulty finding time to exercise. Pt was able to demo with good form and verbalize appropriate muscular engagement. The next appointment is his final visit and I encouraged him to gather any questions.    PT Treatment/Interventions ADLs/Self Care Home Management;Cryotherapy;Electrical Stimulation;Iontophoresis '4mg'$ /ml Dexamethasone;Functional mobility training;Stair training;Gait training;Ultrasound;Traction;Moist Heat;Therapeutic activities;Therapeutic exercise;Neuromuscular re-education;Balance training;Patient/family education;Passive range of motion;Manual techniques;Dry needling;Taping   PT Next Visit Plan d/c   PT Home Exercise Plan seated HSS, LE add with exhale for abdominal engagement, sleeping posture; hooklying ball squeeze & clam with abdominal engagement, sciatic nerve glide; hooklying & seated marching, SLR, seated UE resist hip abd   Consulted and Agree with Plan of Care Patient      Patient will benefit from skilled therapeutic intervention in order to improve the following deficits and impairments:  Decreased range of motion, Difficulty walking, Increased muscle spasms, Decreased activity tolerance, Pain, Improper body mechanics, Impaired flexibility, Hypomobility, Decreased strength, Decreased mobility, Impaired sensation, Postural dysfunction  Visit Diagnosis: Chronic bilateral low back pain with bilateral sciatica     Problem List Patient Active Problem List   Diagnosis Date Noted  . Chronic pain of right knee 05/24/2017  . Unilateral primary osteoarthritis, right knee 05/24/2017  . Chronic bilateral low back pain with bilateral sciatica 12/08/2016    Dynasty Holquin C. Evelette Hollern PT, DPT 07/26/17 9:24 AM   [place] [address] [place], [place], [address] Phone: [phone]   Fax:  [phone]  Name: Stephen Scott MRN: [id number] Date of Birth: November 30, 1960

## 2017-07-29 ENCOUNTER — Ambulatory Visit: Payer: BLUE CROSS/BLUE SHIELD | Attending: Orthopaedic Surgery | Admitting: Physical Therapy

## 2017-07-29 ENCOUNTER — Encounter: Payer: Self-pay | Admitting: Physical Therapy

## 2017-07-29 DIAGNOSIS — M5442 Lumbago with sciatica, left side: Secondary | ICD-10-CM | POA: Insufficient documentation

## 2017-07-29 DIAGNOSIS — M5441 Lumbago with sciatica, right side: Secondary | ICD-10-CM | POA: Insufficient documentation

## 2017-07-29 DIAGNOSIS — G8929 Other chronic pain: Secondary | ICD-10-CM

## 2017-07-29 NOTE — Therapy (Signed)
[place], [place], [address] Phone: [phone]   Fax:  [phone]  Physical Therapy Treatment/Discharge Summary  Patient Details  Name: Stephen Scott MRN: [id number] Date of Birth: 01/11/1961 Referring Provider: Mcarthur Rossetti, MD  Encounter Date: 07/29/2017      PT End of Session - 07/29/17 0811    Visit Number 11   Number of Visits 13   Date for PT Re-Evaluation 07/29/17   Authorization Type [employer] 30 visit limit   PT Start Time 0811  pt arrived late   PT Stop Time 0850   PT Time Calculation (min) 39 min   Activity Tolerance Patient tolerated treatment well   Behavior During Therapy [facility] for tasks assessed/performed      History reviewed. No pertinent past medical history.  Past Surgical History:  Procedure Laterality Date  . HAND SURGERY Left    1989, reconstructive  . KNEE SURGERY Right    2008  . MOUTH SURGERY     1980    There were no vitals filed for this visit.      Subjective Assessment - 07/29/17 0811    Subjective Had a lot of pain after work, has to work this weekend. I don't take the pain medication every day. Had to stand during church.    Patient Stated Goals sit in car, soreness at night, lifting/moving                                 PT Education - 07/29/17 0854    Education provided Yes   Education Details review of goals, long term care of back, lifting, importance of stretching regularly, knee involvement   Person(s) Educated Patient   Methods Explanation   Comprehension Verbalized understanding          PT Short Term Goals - 07/29/17 0819      PT SHORT TERM GOAL #1   Title Pt will verbalize ability to utilize abdominal engagement during resting postures to decrease back pain throughout the day   Baseline able   Status Achieved     PT SHORT TERM GOAL #2   Title Pt will verbalize 50% improvement in overall pain during  daily activities   Baseline notable improvement   Status Achieved           PT Long Term Goals - 07/29/17 0820      PT LONG TERM GOAL #1   Title FOTO to 52% limitation to indicate significant improvement in functional ability   Baseline 50% limited   Status Achieved     PT LONG TERM GOAL #2   Title Bilateral hip gross MMT to 5/5 without increased pain for support to lumbopelvic/LE biomechanical chain   Baseline gross 5/5   Status Achieved     PT LONG TERM GOAL #3   Title Pt will be able to complete lifting activities at work when removing from hospital/morgue LBP <=3/10   Baseline has been able to reduce amount of lifting, able to better use core to support spine   Status Partially Met     PT LONG TERM GOAL #4   Title Pt will be able to sit in the car for at least 45 min without limitation by LBP   Baseline 30-45 min it starts bothering me, main thing that bothers me is the knee   Status Partially Met     PT LONG TERM  GOAL #5   Title Pt will be able to sleep without limitation by LBP   Baseline has gotten to where I can sleep well most nights, still wakes sometimes, pain wise nowhere near what it was.    Status Partially Met               Plan - 07/29/17 0855    Clinical Impression Statement pt is being d/c to independent program at this time. He has made significant improvement in movement ability and care for back pain. Knee continues to be large limiting factor and we discussed care for knee to reduce amount of effect on back pain. Pt reports he is able to utilize core contraction better than before and has become more cognisant of form. Pt was encouraged to contact us with any further questions.    PT Treatment/Interventions ADLs/Self Care Home Management;Cryotherapy;Electrical Stimulation;Iontophoresis '4mg'$ /ml Dexamethasone;Functional mobility training;Stair training;Gait training;Ultrasound;Traction;Moist Heat;Therapeutic activities;Therapeutic  exercise;Neuromuscular re-education;Balance training;Patient/family education;Passive range of motion;Manual techniques;Dry needling;Taping   PT Home Exercise Plan seated HSS, LE add with exhale for abdominal engagement, sleeping posture; hooklying ball squeeze & clam with abdominal engagement, sciatic nerve glide; hooklying & seated marching, SLR, seated UE resist hip abd   Consulted and Agree with Plan of Care Patient      Patient will benefit from skilled therapeutic intervention in order to improve the following deficits and impairments:  Decreased range of motion, Difficulty walking, Increased muscle spasms, Decreased activity tolerance, Pain, Improper body mechanics, Impaired flexibility, Hypomobility, Decreased strength, Decreased mobility, Impaired sensation, Postural dysfunction  Visit Diagnosis: Chronic bilateral low back pain with bilateral sciatica     Problem List Patient Active Problem List   Diagnosis Date Noted  . Chronic pain of right knee 05/24/2017  . Unilateral primary osteoarthritis, right knee 05/24/2017  . Chronic bilateral low back pain with bilateral sciatica 12/08/2016   PHYSICAL THERAPY DISCHARGE SUMMARY  Visits from Start of Care: 11  Current functional level related to goals / functional outcomes: See above   Remaining deficits: See above   Education / Equipment: Anatomy of condition, POC, HEP, exercise form/rationale  Plan: Patient agrees to discharge.  Patient goals were partially met. Patient is being discharged due to meeting the stated rehab goals.  ?????     Ema Hebner C. Korynne Dols PT, DPT 07/29/17 8:57 AM   [facility] [address] [place], [place], [address] Phone: [phone]   Fax:  [phone]  Name: Stephen Scott MRN: [id number] Date of Birth: 1961/05/07

## 2017-08-04 ENCOUNTER — Other Ambulatory Visit (INDEPENDENT_AMBULATORY_CARE_PROVIDER_SITE_OTHER): Payer: Self-pay | Admitting: Orthopaedic Surgery

## 2017-08-04 NOTE — Telephone Encounter (Signed)
Called into pharmacy

## 2017-08-04 NOTE — Telephone Encounter (Signed)
Please advise 

## 2017-08-22 ENCOUNTER — Other Ambulatory Visit (INDEPENDENT_AMBULATORY_CARE_PROVIDER_SITE_OTHER): Payer: Self-pay | Admitting: Orthopaedic Surgery

## 2017-08-22 NOTE — Telephone Encounter (Signed)
Please advise 

## 2017-08-25 DIAGNOSIS — J40 Bronchitis, not specified as acute or chronic: Secondary | ICD-10-CM | POA: Diagnosis not present

## 2017-08-25 DIAGNOSIS — R05 Cough: Secondary | ICD-10-CM | POA: Diagnosis not present

## 2017-08-31 ENCOUNTER — Other Ambulatory Visit (INDEPENDENT_AMBULATORY_CARE_PROVIDER_SITE_OTHER): Payer: Self-pay | Admitting: Physician Assistant

## 2017-09-19 ENCOUNTER — Other Ambulatory Visit (INDEPENDENT_AMBULATORY_CARE_PROVIDER_SITE_OTHER): Payer: Self-pay | Admitting: Orthopaedic Surgery

## 2017-09-21 NOTE — Telephone Encounter (Signed)
Called to pharmacy 

## 2017-09-21 NOTE — Telephone Encounter (Signed)
Ok for refill? 

## 2017-09-30 DIAGNOSIS — E559 Vitamin D deficiency, unspecified: Secondary | ICD-10-CM | POA: Diagnosis not present

## 2017-09-30 DIAGNOSIS — J329 Chronic sinusitis, unspecified: Secondary | ICD-10-CM | POA: Diagnosis not present

## 2017-09-30 DIAGNOSIS — R05 Cough: Secondary | ICD-10-CM | POA: Diagnosis not present

## 2017-09-30 DIAGNOSIS — E7800 Pure hypercholesterolemia, unspecified: Secondary | ICD-10-CM | POA: Diagnosis not present

## 2017-09-30 DIAGNOSIS — Z79899 Other long term (current) drug therapy: Secondary | ICD-10-CM | POA: Diagnosis not present

## 2017-09-30 DIAGNOSIS — I10 Essential (primary) hypertension: Secondary | ICD-10-CM | POA: Diagnosis not present

## 2017-09-30 DIAGNOSIS — J069 Acute upper respiratory infection, unspecified: Secondary | ICD-10-CM | POA: Diagnosis not present

## 2017-10-04 ENCOUNTER — Other Ambulatory Visit (INDEPENDENT_AMBULATORY_CARE_PROVIDER_SITE_OTHER): Payer: Self-pay | Admitting: Orthopaedic Surgery

## 2017-10-09 ENCOUNTER — Other Ambulatory Visit (INDEPENDENT_AMBULATORY_CARE_PROVIDER_SITE_OTHER): Payer: Self-pay | Admitting: Orthopaedic Surgery

## 2017-10-10 NOTE — Telephone Encounter (Signed)
Please advise 

## 2017-10-17 ENCOUNTER — Other Ambulatory Visit (INDEPENDENT_AMBULATORY_CARE_PROVIDER_SITE_OTHER): Payer: Self-pay | Admitting: Physician Assistant

## 2017-10-18 ENCOUNTER — Ambulatory Visit (INDEPENDENT_AMBULATORY_CARE_PROVIDER_SITE_OTHER): Payer: BLUE CROSS/BLUE SHIELD | Admitting: Orthopaedic Surgery

## 2017-10-18 ENCOUNTER — Encounter (INDEPENDENT_AMBULATORY_CARE_PROVIDER_SITE_OTHER): Payer: Self-pay | Admitting: Orthopaedic Surgery

## 2017-10-18 DIAGNOSIS — M1711 Unilateral primary osteoarthritis, right knee: Secondary | ICD-10-CM | POA: Diagnosis not present

## 2017-10-18 NOTE — Progress Notes (Signed)
Office Visit Note   Patient: Stephen Scott           Date of Birth: 1961/02/24           MRN: [id number] Visit Date: 10/18/2017              Requested by: No referring provider defined for this encounter. PCP: System, Pcp Not In   Assessment & Plan: Visit Diagnoses:  1. Unilateral primary osteoarthritis, right knee     Plan: Given the failure of conservative treatment options and given the severity of his arthritis now this is detrimentally affecting his life at this point, a right total knee arthroplasty is definitely reasonable and recommended.  We went over his x-rays in detail and talked about the surgery including a thorough discussion of the intraoperative and postoperative course as well as the risk and benefits of surgery.  We have this scheduled for February 5.  We then see him back in 2 weeks postoperative but no x-rays are needed.  All questions concerns were answered and addressed.  Follow-Up Instructions: Return for post op 2weeks.   Orders:  No orders of the defined types were placed in this encounter.  No orders of the defined types were placed in this encounter.     Procedures: No procedures performed   Clinical Data: No additional findings.   Subjective: Chief Complaint  Patient presents with  . Right Knee - Pain, Follow-up  The patient is well-known to us.  He has severe end-stage arthritis of his right knee.  He had arthroscopic intervention years ago and they performed a partial medial meniscectomy.  Since then he is developed worsening pain but is detrimentally affecting his activities daily living, his quality of life, his mobility.  It hurts on a daily basis and can be 10 out of 10 at times.  We have tried steroid injections and hyaluronic acid injections.  These have not helped at this point.  An MRI and x-rays confirm severe end-stage arthritis of his right knee.  At this point he does wish to proceed with total knee arthroplasty  surgery.  HPI  Review of Systems He currently denies any headache, chest pain, shortness of breath, fever, chills, nausea, vomiting.  Objective: Vital Signs: There were no vitals taken for this visit.  Physical Exam He is alert and oriented x3 and in no acute distress Ortho Exam Examination of his right knee shows near neutral alignment but an effusion.  His range of motion is full and his knee feels ligaments are stable but is painful throughout its arc of motion. Specialty Comments:  No specialty comments available.  Imaging: No results found.   PMFS History: Patient Active Problem List   Diagnosis Date Noted  . Chronic pain of right knee 05/24/2017  . Unilateral primary osteoarthritis, right knee 05/24/2017  . Chronic bilateral low back pain with bilateral sciatica 12/08/2016   History reviewed. No pertinent past medical history.  History reviewed. No pertinent family history.  Past Surgical History:  Procedure Laterality Date  . HAND SURGERY Left    1989, reconstructive  . KNEE SURGERY Right    2008  . MOUTH SURGERY     1980   Social History   Occupational History  . Not on file  Tobacco Use  . Smoking status: Never Smoker  . Smokeless tobacco: Never Used  Substance and Sexual Activity  . Alcohol use: Not on file  . Drug use: Not on file  . Sexual activity: Not  on file

## 2017-10-19 ENCOUNTER — Other Ambulatory Visit (INDEPENDENT_AMBULATORY_CARE_PROVIDER_SITE_OTHER): Payer: Self-pay

## 2017-10-20 NOTE — Pre-Procedure Instructions (Signed)
Sinclair ShipJohn Laur  10/20/2017      [facility] [phone] - [place] [address] [facility] [address] [facility] [address] [place] [address] Phone: [phone] Fax: [phone]    Your procedure is scheduled on November 01, 2017.  [facility] at [phone] AM.  Call this number if you have problems the morning of surgery:  [phone]   Remember:  Do not eat food or drink liquids after midnight.  Take these medicines the morning of surgery with A SIP OF WATER bupropion (wellbutrin), methocarbamol (robaxin), Afrin nasal spray-if needed, tramadol (ultram)-if needed for pain  7 days prior to surgery STOP taking any meloxicam (mobic), Aspirin (unless otherwise instructed by your surgeon), Aleve, Naproxen, Ibuprofen, Motrin, Advil, Goody's, BC's, all herbal medications, fish oil, and all vitamins  Continue all other medications as instructed by your physician except follow the above medication instructions before surgery   Do not wear jewelry.  Do not wear lotions, powders, or colognes, or deodorant.  Men may shave face and neck.  Do not bring valuables to the hospital.  [facility] is not responsible for any belongings or valuables.  Contacts, dentures or bridgework may not be worn into surgery.  Leave your suitcase in the car.  After surgery it may be brought to your room.  For patients admitted to the hospital, discharge time will be determined by your treatment team.  Patients discharged the day of surgery will not be allowed to drive home.   Special instructions:   [place]- Preparing For Surgery  Before surgery, you can play an important role. Because skin is not sterile, your skin needs to be as free of germs as possible. You can reduce the number of germs on your skin by washing with CHG (chlorahexidine gluconate) Soap before surgery.  CHG is an antiseptic cleaner which kills germs and bonds with the skin to continue  killing germs even after washing.  Please do not use if you have an allergy to CHG or antibacterial soaps. If your skin becomes reddened/irritated stop using the CHG.  Do not shave (including legs and underarms) for at least 48 hours prior to first CHG shower. It is OK to shave your face.  Please follow these instructions carefully.   1. Shower the NIGHT BEFORE SURGERY and the MORNING OF SURGERY with CHG.   2. If you chose to wash your hair, wash your hair first as usual with your normal shampoo.  3. After you shampoo, rinse your hair and body thoroughly to remove the shampoo.  4. Use CHG as you would any other liquid soap. You can apply CHG directly to the skin and wash gently with a scrungie or a clean washcloth.   5. Apply the CHG Soap to your body ONLY FROM THE NECK DOWN.  Do not use on open wounds or open sores. Avoid contact with your eyes, ears, mouth and genitals (private parts). Wash Face and genitals (private parts)  with your normal soap.  6. Wash thoroughly, paying special attention to the area where your surgery will be performed.  7. Thoroughly rinse your body with warm water from the neck down.  8. DO NOT shower/wash with your normal soap after using and rinsing off the CHG Soap.  9. Pat yourself dry with a CLEAN TOWEL.  10. Wear CLEAN PAJAMAS to bed the night before surgery, wear comfortable clothes the morning of surgery  11. Place CLEAN SHEETS on your bed the night of your first shower and DO NOT SLEEP WITH PETS.  Day of Surgery: Do not apply any deodorants/lotions. Please wear clean clothes to the hospital/surgery center.    Please read over the following fact sheets that you were given. Pain Booklet, Coughing and Deep Breathing, MRSA Information and Surgical Site Infection Prevention

## 2017-10-20 NOTE — Progress Notes (Addendum)
[facility]: [facility]   Cardiologist: pt denies  EKG: pt denies past year  Stress test: 10 + years ago  ECHO: pt denies ever  Cardiac Cath: pt denies ever  Chest x-ray: 1//2019 -follow up visit after having walking pnuemonia-requested records

## 2017-10-21 ENCOUNTER — Other Ambulatory Visit: Payer: Self-pay

## 2017-10-21 ENCOUNTER — Encounter (HOSPITAL_COMMUNITY)
Admission: RE | Admit: 2017-10-21 | Discharge: 2017-10-21 | Disposition: A | Discharge status: Home / Self Care | Payer: BLUE CROSS/BLUE SHIELD | Source: Ambulatory Visit | Attending: Orthopaedic Surgery | Admitting: Orthopaedic Surgery

## 2017-10-21 ENCOUNTER — Other Ambulatory Visit (INDEPENDENT_AMBULATORY_CARE_PROVIDER_SITE_OTHER): Payer: Self-pay | Admitting: Orthopaedic Surgery

## 2017-10-21 ENCOUNTER — Encounter (HOSPITAL_COMMUNITY): Payer: Self-pay

## 2017-10-21 DIAGNOSIS — M1711 Unilateral primary osteoarthritis, right knee: Secondary | ICD-10-CM | POA: Insufficient documentation

## 2017-10-21 DIAGNOSIS — Z01818 Encounter for other preprocedural examination: Secondary | ICD-10-CM | POA: Diagnosis not present

## 2017-10-21 HISTORY — DX: Personal history of urinary calculi: Z87.442

## 2017-10-21 HISTORY — DX: Unspecified osteoarthritis, unspecified site: M19.90

## 2017-10-21 HISTORY — DX: Gastro-esophageal reflux disease without esophagitis: K21.9

## 2017-10-21 HISTORY — DX: Depression, unspecified: F32.A

## 2017-10-21 HISTORY — DX: Major depressive disorder, single episode, unspecified: F32.9

## 2017-10-21 HISTORY — DX: Essential (primary) hypertension: I10

## 2017-10-21 HISTORY — DX: Pneumonia, unspecified organism: J18.9

## 2017-10-21 HISTORY — DX: Anemia, unspecified: D64.9

## 2017-10-21 HISTORY — DX: Anxiety disorder, unspecified: F41.9

## 2017-10-21 LAB — BASIC METABOLIC PANEL
ANION GAP: 11 (ref 5–15)
BUN: 15 mg/dL (ref 6–20)
CHLORIDE: 105 mmol/L (ref 101–111)
CO2: 21 mmol/L — ABNORMAL LOW (ref 22–32)
Calcium: 8.8 mg/dL — ABNORMAL LOW (ref 8.9–10.3)
Creatinine, Ser: 0.93 mg/dL (ref 0.61–1.24)
GFR calc Af Amer: >60 mL/min (ref >60)
GFR calc non Af Amer: >60 mL/min (ref >60)
GLUCOSE: 130 mg/dL — AB (ref 65–99)
POTASSIUM: 4.2 mmol/L (ref 3.5–5.1)
Sodium: 137 mmol/L (ref 135–145)

## 2017-10-21 LAB — CBC
HEMATOCRIT: 44.7 % (ref 39.0–52.0)
HEMOGLOBIN: 14.9 g/dL (ref 13.0–17.0)
MCH: 29.6 pg (ref 26.0–34.0)
MCHC: 33.3 g/dL (ref 30.0–36.0)
MCV: 88.7 fL (ref 78.0–100.0)
Platelets: 183 10*3/uL (ref 150–400)
RBC: 5.04 MIL/uL (ref 4.22–5.81)
RDW: 13.6 % (ref 11.5–15.5)
WBC: 7.3 10*3/uL (ref 4.0–10.5)

## 2017-10-21 LAB — SURGICAL PCR SCREEN
MRSA, PCR: NEGATIVE
Staphylococcus aureus: NEGATIVE

## 2017-10-21 NOTE — Telephone Encounter (Signed)
Ok for refill? 

## 2017-10-21 NOTE — Telephone Encounter (Signed)
Ok to refill 

## 2017-10-24 ENCOUNTER — Ambulatory Visit (INDEPENDENT_AMBULATORY_CARE_PROVIDER_SITE_OTHER): Payer: BLUE CROSS/BLUE SHIELD | Admitting: Orthopaedic Surgery

## 2017-10-24 ENCOUNTER — Telehealth (INDEPENDENT_AMBULATORY_CARE_PROVIDER_SITE_OTHER): Payer: Self-pay | Admitting: Orthopaedic Surgery

## 2017-10-24 NOTE — Telephone Encounter (Signed)
Patient called left voicemail message needing Rx refill for (Tramadol) 50mg . Patient advised he is in a lot of pain. [facility] [address] The number to contact patient is [phone]

## 2017-10-24 NOTE — Progress Notes (Signed)
Anesthesia Chart Review: Patient is a 57 year old male scheduled for right TKA on 11/01/17 by Dr. Doneen Poissonhristopher Blackman.  History includes never smoker, HTN, depression, nephrolithiasis, anxiety, arthritis, childhood anemia, GERD, oral surgery '80, left hand reconstructive surgery '89. BMI is consistent with obesity.  No PCP is listed, but 09/30/17 office note by Doreen Salvageonald Bulla, PA-C received [facility] He is aware of surgery plans. CXR was ordered then due to URI/cough. Patient reported he was treeated for "walking pneumonia." Patient reported that he does not see a cardiologist.   Meds include Wellbutrin SR, lisinopril-HCTZ, Robaxin, Afrin nasal spray, pravastatin, tramadol.   BP (!) 163/75   Pulse 72   Temp 36.4 C   Resp 20   Ht 5\' 11"  (1.803 m)   Wt 261 lb 1.6 oz (118.4 kg)   SpO2 100%   BMI 36.42 kg/m   EKG 10/21/17: NSR.   He reported a stress test > 10 years ago.  CXR (ordered on 09/30/17): Requested. [facility], xray report to be faxed by "[facility]."  Preoperative labs noted. Cr 0.93. Non-fasting glucose 130. CBC WNL.   09/30/17 CXR report still pending (will leave for nursing staff to follow-up). Reported treated for walking pneumonia one month ago. Anesthesia APP not consulted to see patient at PAT, but no acute cardiopulmonary symptoms documented. Dr. Magnus IvanBlackman does document patient without chest pain, SOB, fever, chills at his 10/18/17 office visit. Based on currently available information, I would anticipate that he can proceed as planned if no acute changes.   Velna Ochsllison Mirca Yale, PA-C [facility] Stay [facility]/Anesthesiology Phone [phone] 10/24/2017 2:39 PM

## 2017-10-24 NOTE — Telephone Encounter (Signed)
Ok to refill tramadol 100 mg every 6-8 hours as needed, #60

## 2017-10-24 NOTE — Telephone Encounter (Signed)
Please advise 

## 2017-10-24 NOTE — Telephone Encounter (Signed)
Patient aware this was called in this weekend ; he states he picked it up

## 2017-10-28 NOTE — Progress Notes (Signed)
[facility] to [facility] For [facility], [facility], then I was transferred to the [place] office & spoke with Morrison Oldisha, she will look it up & then [facility] [phone]

## 2017-10-31 MED ORDER — DEXTROSE 5 % IV SOLN
3.0000 g | INTRAVENOUS | Status: AC
  Administered 2017-11-01: 3 g via INTRAVENOUS
  Filled 2017-10-31: qty 3

## 2017-10-31 MED ORDER — TRANEXAMIC ACID 1000 MG/10ML IV SOLN
1000.0000 mg | INTRAVENOUS | Status: DC
  Filled 2017-10-31: qty 10

## 2017-11-01 ENCOUNTER — Other Ambulatory Visit: Payer: Self-pay

## 2017-11-01 ENCOUNTER — Encounter (HOSPITAL_COMMUNITY): Payer: Self-pay | Admitting: Urology

## 2017-11-01 ENCOUNTER — Inpatient Hospital Stay (HOSPITAL_COMMUNITY): Payer: BLUE CROSS/BLUE SHIELD | Admitting: Certified Registered"

## 2017-11-01 ENCOUNTER — Encounter (HOSPITAL_COMMUNITY)
Admission: RE | Disposition: A | Discharge status: Home Health | Payer: Self-pay | Source: Ambulatory Visit | Attending: Orthopaedic Surgery

## 2017-11-01 ENCOUNTER — Inpatient Hospital Stay (HOSPITAL_COMMUNITY): Payer: BLUE CROSS/BLUE SHIELD

## 2017-11-01 ENCOUNTER — Inpatient Hospital Stay (HOSPITAL_COMMUNITY)
Admission: RE | Admit: 2017-11-01 | Discharge: 2017-11-04 | DRG: 470 | Disposition: A | Discharge status: Home Health | Payer: BLUE CROSS/BLUE SHIELD | Source: Ambulatory Visit | Attending: Orthopaedic Surgery | Admitting: Orthopaedic Surgery

## 2017-11-01 ENCOUNTER — Inpatient Hospital Stay (HOSPITAL_COMMUNITY): Payer: BLUE CROSS/BLUE SHIELD | Admitting: Vascular Surgery

## 2017-11-01 DIAGNOSIS — Z79899 Other long term (current) drug therapy: Secondary | ICD-10-CM | POA: Diagnosis not present

## 2017-11-01 DIAGNOSIS — G8918 Other acute postprocedural pain: Secondary | ICD-10-CM | POA: Diagnosis not present

## 2017-11-01 DIAGNOSIS — Z887 Allergy status to serum and vaccine status: Secondary | ICD-10-CM | POA: Diagnosis not present

## 2017-11-01 DIAGNOSIS — M1711 Unilateral primary osteoarthritis, right knee: Principal | ICD-10-CM | POA: Diagnosis present

## 2017-11-01 DIAGNOSIS — Z96651 Presence of right artificial knee joint: Secondary | ICD-10-CM | POA: Diagnosis not present

## 2017-11-01 DIAGNOSIS — Z9109 Other allergy status, other than to drugs and biological substances: Secondary | ICD-10-CM

## 2017-11-01 DIAGNOSIS — I10 Essential (primary) hypertension: Secondary | ICD-10-CM | POA: Diagnosis present

## 2017-11-01 DIAGNOSIS — Z23 Encounter for immunization: Secondary | ICD-10-CM | POA: Diagnosis not present

## 2017-11-01 DIAGNOSIS — K219 Gastro-esophageal reflux disease without esophagitis: Secondary | ICD-10-CM | POA: Diagnosis present

## 2017-11-01 DIAGNOSIS — Z471 Aftercare following joint replacement surgery: Secondary | ICD-10-CM | POA: Diagnosis not present

## 2017-11-01 HISTORY — DX: Low back pain, unspecified: M54.50

## 2017-11-01 HISTORY — DX: Other chronic pain: G89.29

## 2017-11-01 HISTORY — DX: Pure hypercholesterolemia, unspecified: E78.00

## 2017-11-01 HISTORY — DX: Low back pain: M54.5

## 2017-11-01 HISTORY — DX: Spinal stenosis, site unspecified: M48.00

## 2017-11-01 HISTORY — PX: TOTAL KNEE ARTHROPLASTY: SHX125

## 2017-11-01 HISTORY — DX: Family history of other specified conditions: Z84.89

## 2017-11-01 SURGERY — ARTHROPLASTY, KNEE, TOTAL
Anesthesia: Regional | Site: Knee | Laterality: Right

## 2017-11-01 MED ORDER — MIDAZOLAM HCL 2 MG/2ML IJ SOLN
INTRAMUSCULAR | Status: AC
  Administered 2017-11-01: 2 mg via INTRAVENOUS
  Filled 2017-11-01: qty 2

## 2017-11-01 MED ORDER — ONDANSETRON HCL 4 MG PO TABS: 4.0000 mg | ORAL_TABLET | Freq: Four times a day (QID) | ORAL | Status: DC | PRN

## 2017-11-01 MED ORDER — TRANEXAMIC ACID 1000 MG/10ML IV SOLN
INTRAVENOUS | Status: DC | PRN
  Administered 2017-11-01: 1000 mg via INTRAVENOUS

## 2017-11-01 MED ORDER — MIDAZOLAM HCL 2 MG/2ML IJ SOLN
2.0000 mg | Freq: Once | INTRAMUSCULAR | Status: AC
  Administered 2017-11-01: 2 mg via INTRAVENOUS

## 2017-11-01 MED ORDER — PHENOL 1.4 % MT LIQD: 1.0000 | OROMUCOSAL | Status: DC | PRN

## 2017-11-01 MED ORDER — PRAVASTATIN SODIUM 10 MG PO TABS
20.0000 mg | ORAL_TABLET | Freq: Every day | ORAL | Status: DC
  Administered 2017-11-01 – 2017-11-03 (×3): 20 mg via ORAL
  Filled 2017-11-01 (×3): qty 2

## 2017-11-01 MED ORDER — FENTANYL CITRATE (PF) 100 MCG/2ML IJ SOLN
INTRAMUSCULAR | Status: DC | PRN
  Administered 2017-11-01: 50 ug via INTRAVENOUS

## 2017-11-01 MED ORDER — BUPIVACAINE IN DEXTROSE 0.75-8.25 % IT SOLN
INTRATHECAL | Status: DC | PRN
  Administered 2017-11-01: 2 mg via INTRATHECAL

## 2017-11-01 MED ORDER — SODIUM CHLORIDE 0.9 % IV SOLN
INTRAVENOUS | Status: DC
  Administered 2017-11-01 – 2017-11-02 (×2): via INTRAVENOUS

## 2017-11-01 MED ORDER — HYDROMORPHONE HCL 1 MG/ML IJ SOLN
1.0000 mg | INTRAMUSCULAR | Status: DC | PRN
  Administered 2017-11-01 – 2017-11-03 (×6): 1 mg via INTRAVENOUS
  Filled 2017-11-01 (×5): qty 1

## 2017-11-01 MED ORDER — METOCLOPRAMIDE HCL 5 MG/ML IJ SOLN: 5.0000 mg | Freq: Three times a day (TID) | INTRAMUSCULAR | Status: DC | PRN

## 2017-11-01 MED ORDER — PROPOFOL 10 MG/ML IV BOLUS
INTRAVENOUS | Status: AC
  Filled 2017-11-01: qty 20

## 2017-11-01 MED ORDER — MENTHOL 3 MG MT LOZG: 1.0000 | LOZENGE | OROMUCOSAL | Status: DC | PRN

## 2017-11-01 MED ORDER — MIDAZOLAM HCL 5 MG/5ML IJ SOLN
INTRAMUSCULAR | Status: DC | PRN
  Administered 2017-11-01: 2 mg via INTRAVENOUS

## 2017-11-01 MED ORDER — CHLORHEXIDINE GLUCONATE 4 % EX LIQD: 60.0000 mL | Freq: Once | CUTANEOUS | Status: DC

## 2017-11-01 MED ORDER — METHOCARBAMOL 1000 MG/10ML IJ SOLN
500.0000 mg | Freq: Four times a day (QID) | INTRAVENOUS | Status: DC | PRN
  Administered 2017-11-02: 500 mg via INTRAVENOUS
  Filled 2017-11-01 (×2): qty 5

## 2017-11-01 MED ORDER — ROPIVACAINE HCL 5 MG/ML IJ SOLN
INTRAMUSCULAR | Status: DC | PRN
  Administered 2017-11-01: 20 mL via PERINEURAL

## 2017-11-01 MED ORDER — OXYCODONE HCL 5 MG/5ML PO SOLN: 5.0000 mg | Freq: Once | ORAL | Status: DC | PRN

## 2017-11-01 MED ORDER — ACETAMINOPHEN 650 MG RE SUPP: 650.0000 mg | RECTAL | Status: DC | PRN

## 2017-11-01 MED ORDER — LACTATED RINGERS IV SOLN
INTRAVENOUS | Status: DC | PRN
  Administered 2017-11-01 (×2): via INTRAVENOUS

## 2017-11-01 MED ORDER — ALUM & MAG HYDROXIDE-SIMETH 200-200-20 MG/5ML PO SUSP: 30.0000 mL | ORAL | Status: DC | PRN

## 2017-11-01 MED ORDER — SODIUM CHLORIDE 0.9 % IR SOLN
Status: DC | PRN
  Administered 2017-11-01: 3000 mL

## 2017-11-01 MED ORDER — LISINOPRIL 20 MG PO TABS
20.0000 mg | ORAL_TABLET | Freq: Every day | ORAL | Status: DC
  Administered 2017-11-02 – 2017-11-04 (×3): 20 mg via ORAL
  Filled 2017-11-01 (×3): qty 1

## 2017-11-01 MED ORDER — CEFAZOLIN SODIUM-DEXTROSE 1-4 GM/50ML-% IV SOLN
1.0000 g | Freq: Four times a day (QID) | INTRAVENOUS | Status: AC
  Administered 2017-11-01 – 2017-11-02 (×2): 1 g via INTRAVENOUS
  Filled 2017-11-01 (×2): qty 50

## 2017-11-01 MED ORDER — FENTANYL CITRATE (PF) 250 MCG/5ML IJ SOLN
INTRAMUSCULAR | Status: AC
  Filled 2017-11-01: qty 5

## 2017-11-01 MED ORDER — DEXTROSE 5 % IV SOLN
INTRAVENOUS | Status: DC | PRN
  Administered 2017-11-01: 45 ug/min via INTRAVENOUS

## 2017-11-01 MED ORDER — LIDOCAINE 2% (20 MG/ML) 5 ML SYRINGE
INTRAMUSCULAR | Status: AC
  Filled 2017-11-01: qty 5

## 2017-11-01 MED ORDER — ONDANSETRON HCL 4 MG/2ML IJ SOLN
INTRAMUSCULAR | Status: AC
  Filled 2017-11-01: qty 2

## 2017-11-01 MED ORDER — BUPROPION HCL ER (SR) 150 MG PO TB12
150.0000 mg | ORAL_TABLET | Freq: Two times a day (BID) | ORAL | Status: DC
  Administered 2017-11-01 – 2017-11-04 (×6): 150 mg via ORAL
  Filled 2017-11-01 (×6): qty 1

## 2017-11-01 MED ORDER — HYDROCHLOROTHIAZIDE 12.5 MG PO CAPS
12.5000 mg | ORAL_CAPSULE | Freq: Every day | ORAL | Status: DC
  Administered 2017-11-02 – 2017-11-04 (×3): 12.5 mg via ORAL
  Filled 2017-11-01 (×3): qty 1

## 2017-11-01 MED ORDER — OXYCODONE HCL 5 MG PO TABS
10.0000 mg | ORAL_TABLET | ORAL | Status: DC | PRN
  Administered 2017-11-01 – 2017-11-02 (×3): 10 mg via ORAL
  Administered 2017-11-02: 15 mg via ORAL
  Administered 2017-11-02: 10 mg via ORAL
  Administered 2017-11-02: 15 mg via ORAL
  Administered 2017-11-03 – 2017-11-04 (×6): 10 mg via ORAL
  Filled 2017-11-01 (×4): qty 2
  Filled 2017-11-01: qty 3
  Filled 2017-11-01 (×4): qty 2
  Filled 2017-11-01: qty 3
  Filled 2017-11-01 (×3): qty 2

## 2017-11-01 MED ORDER — LACTATED RINGERS IV SOLN
INTRAVENOUS | Status: DC
  Administered 2017-11-01: 11:00:00 via INTRAVENOUS

## 2017-11-01 MED ORDER — PROMETHAZINE HCL 25 MG/ML IJ SOLN: 6.2500 mg | INTRAMUSCULAR | Status: DC | PRN

## 2017-11-01 MED ORDER — FENTANYL CITRATE (PF) 100 MCG/2ML IJ SOLN
100.0000 ug | Freq: Once | INTRAMUSCULAR | Status: AC
  Administered 2017-11-01: 100 ug via INTRAVENOUS

## 2017-11-01 MED ORDER — METOCLOPRAMIDE HCL 5 MG PO TABS: 5.0000 mg | ORAL_TABLET | Freq: Three times a day (TID) | ORAL | Status: DC | PRN

## 2017-11-01 MED ORDER — ACETAMINOPHEN 325 MG PO TABS: 650.0000 mg | ORAL_TABLET | ORAL | Status: DC | PRN

## 2017-11-01 MED ORDER — OXYCODONE HCL 5 MG PO TABS: 5.0000 mg | ORAL_TABLET | Freq: Once | ORAL | Status: DC | PRN

## 2017-11-01 MED ORDER — PHENYLEPHRINE 40 MCG/ML (10ML) SYRINGE FOR IV PUSH (FOR BLOOD PRESSURE SUPPORT)
PREFILLED_SYRINGE | INTRAVENOUS | Status: AC
  Filled 2017-11-01: qty 10

## 2017-11-01 MED ORDER — 0.9 % SODIUM CHLORIDE (POUR BTL) OPTIME
TOPICAL | Status: DC | PRN
  Administered 2017-11-01: 1000 mL

## 2017-11-01 MED ORDER — ZOLPIDEM TARTRATE 5 MG PO TABS: 5.0000 mg | ORAL_TABLET | Freq: Every evening | ORAL | Status: DC | PRN

## 2017-11-01 MED ORDER — INFLUENZA VAC SPLIT QUAD 0.5 ML IM SUSY
0.5000 mL | PREFILLED_SYRINGE | INTRAMUSCULAR | Status: AC
  Administered 2017-11-03: 0.5 mL via INTRAMUSCULAR
  Filled 2017-11-01: qty 0.5

## 2017-11-01 MED ORDER — ONDANSETRON HCL 4 MG/2ML IJ SOLN
4.0000 mg | Freq: Four times a day (QID) | INTRAMUSCULAR | Status: DC | PRN
Start: 2017-11-01 — End: 2017-11-04

## 2017-11-01 MED ORDER — ONDANSETRON HCL 4 MG/2ML IJ SOLN
INTRAMUSCULAR | Status: DC | PRN
  Administered 2017-11-01: 4 mg via INTRAVENOUS

## 2017-11-01 MED ORDER — LISINOPRIL-HYDROCHLOROTHIAZIDE 20-12.5 MG PO TABS: 1.0000 | ORAL_TABLET | Freq: Every day | ORAL | Status: DC

## 2017-11-01 MED ORDER — BUPIVACAINE LIPOSOME 1.3 % IJ SUSP
20.0000 mL | Freq: Once | INTRAMUSCULAR | Status: DC
  Filled 2017-11-01: qty 20

## 2017-11-01 MED ORDER — DEXAMETHASONE SODIUM PHOSPHATE 4 MG/ML IJ SOLN
INTRAMUSCULAR | Status: DC | PRN
  Administered 2017-11-01: 8 mg via INTRAVENOUS

## 2017-11-01 MED ORDER — PHENYLEPHRINE 40 MCG/ML (10ML) SYRINGE FOR IV PUSH (FOR BLOOD PRESSURE SUPPORT)
PREFILLED_SYRINGE | INTRAVENOUS | Status: DC | PRN
  Administered 2017-11-01 (×3): 80 ug via INTRAVENOUS

## 2017-11-01 MED ORDER — EPHEDRINE 5 MG/ML INJ
INTRAVENOUS | Status: AC
  Filled 2017-11-01: qty 10

## 2017-11-01 MED ORDER — HYDROMORPHONE HCL 1 MG/ML IJ SOLN: 0.2500 mg | INTRAMUSCULAR | Status: DC | PRN

## 2017-11-01 MED ORDER — DOCUSATE SODIUM 100 MG PO CAPS
100.0000 mg | ORAL_CAPSULE | Freq: Two times a day (BID) | ORAL | Status: DC
  Administered 2017-11-01 – 2017-11-04 (×6): 100 mg via ORAL
  Filled 2017-11-01 (×6): qty 1

## 2017-11-01 MED ORDER — PROPOFOL 500 MG/50ML IV EMUL
INTRAVENOUS | Status: DC | PRN
  Administered 2017-11-01: 60 ug/kg/min via INTRAVENOUS

## 2017-11-01 MED ORDER — DEXAMETHASONE SODIUM PHOSPHATE 10 MG/ML IJ SOLN
INTRAMUSCULAR | Status: AC
  Filled 2017-11-01: qty 1

## 2017-11-01 MED ORDER — MIDAZOLAM HCL 2 MG/2ML IJ SOLN
INTRAMUSCULAR | Status: AC
  Filled 2017-11-01: qty 2

## 2017-11-01 MED ORDER — ASPIRIN EC 325 MG PO TBEC
325.0000 mg | DELAYED_RELEASE_TABLET | Freq: Two times a day (BID) | ORAL | Status: DC
  Administered 2017-11-01 – 2017-11-04 (×6): 325 mg via ORAL
  Filled 2017-11-01 (×6): qty 1

## 2017-11-01 MED ORDER — METHOCARBAMOL 500 MG PO TABS
500.0000 mg | ORAL_TABLET | Freq: Four times a day (QID) | ORAL | Status: DC | PRN
  Administered 2017-11-02 – 2017-11-03 (×4): 500 mg via ORAL
  Filled 2017-11-01 (×4): qty 1

## 2017-11-01 MED ORDER — LIDOCAINE 2% (20 MG/ML) 5 ML SYRINGE
INTRAMUSCULAR | Status: DC | PRN
  Administered 2017-11-01: 40 mg via INTRAVENOUS

## 2017-11-01 MED ORDER — POLYETHYLENE GLYCOL 3350 17 G PO PACK: 17.0000 g | PACK | Freq: Every day | ORAL | Status: DC | PRN

## 2017-11-01 MED ORDER — FENTANYL CITRATE (PF) 100 MCG/2ML IJ SOLN
INTRAMUSCULAR | Status: AC
  Administered 2017-11-01: 100 ug via INTRAVENOUS
  Filled 2017-11-01: qty 2

## 2017-11-01 MED ORDER — HYDROCODONE-ACETAMINOPHEN 5-325 MG PO TABS
1.0000 | ORAL_TABLET | ORAL | Status: DC | PRN
  Administered 2017-11-01 – 2017-11-03 (×4): 2 via ORAL
  Filled 2017-11-01 (×4): qty 2

## 2017-11-01 MED ORDER — BUPIVACAINE HCL (PF) 0.25 % IJ SOLN
INTRAMUSCULAR | Status: AC
  Filled 2017-11-01: qty 30

## 2017-11-01 MED ORDER — GABAPENTIN 100 MG PO CAPS
100.0000 mg | ORAL_CAPSULE | Freq: Three times a day (TID) | ORAL | Status: DC
  Administered 2017-11-01 – 2017-11-04 (×8): 100 mg via ORAL
  Filled 2017-11-01 (×8): qty 1

## 2017-11-01 MED ORDER — DIPHENHYDRAMINE HCL 12.5 MG/5ML PO ELIX
12.5000 mg | ORAL_SOLUTION | ORAL | Status: DC | PRN
  Administered 2017-11-02: 12.5 mg via ORAL
  Filled 2017-11-01: qty 10

## 2017-11-01 MED ORDER — PROPOFOL 10 MG/ML IV BOLUS
INTRAVENOUS | Status: DC | PRN
  Administered 2017-11-01: 20 mg via INTRAVENOUS
  Administered 2017-11-01: 40 mg via INTRAVENOUS

## 2017-11-01 SURGICAL SUPPLY — 61 items
BANDAGE ACE 6X5 VEL STRL LF (GAUZE/BANDAGES/DRESSINGS) ×2 IMPLANT
BANDAGE ESMARK 6X9 LF (GAUZE/BANDAGES/DRESSINGS) ×1 IMPLANT
BENZOIN TINCTURE PRP APPL 2/3 (GAUZE/BANDAGES/DRESSINGS) ×2 IMPLANT
BLADE SAG 18X100X1.27 (BLADE) ×2 IMPLANT
BNDG ESMARK 6X9 LF (GAUZE/BANDAGES/DRESSINGS) ×2
BOWL SMART MIX CTS (DISPOSABLE) ×2 IMPLANT
CAPT KNEE TRIATH TK-4 ×2 IMPLANT
CLSR STERI-STRIP ANTIMIC 1/2X4 (GAUZE/BANDAGES/DRESSINGS) ×2 IMPLANT
COVER SURGICAL LIGHT HANDLE (MISCELLANEOUS) ×2 IMPLANT
CUFF TOURNIQUET SINGLE 34IN LL (TOURNIQUET CUFF) ×2 IMPLANT
CUFF TOURNIQUET SINGLE 44IN (TOURNIQUET CUFF) IMPLANT
DRAPE EXTREMITY T 121X128X90 (DRAPE) ×2 IMPLANT
DRAPE HALF SHEET 40X57 (DRAPES) ×2 IMPLANT
DRAPE U-SHAPE 47X51 STRL (DRAPES) ×2 IMPLANT
DRSG PAD ABDOMINAL 8X10 ST (GAUZE/BANDAGES/DRESSINGS) ×2 IMPLANT
DURAPREP 26ML APPLICATOR (WOUND CARE) ×2 IMPLANT
ELECT CAUTERY BLADE 6.4 (BLADE) ×2 IMPLANT
ELECT REM PT RETURN 9FT ADLT (ELECTROSURGICAL) ×2
ELECTRODE REM PT RTRN 9FT ADLT (ELECTROSURGICAL) ×1 IMPLANT
FACESHIELD WRAPAROUND (MASK) ×4 IMPLANT
GAUZE SPONGE 4X4 12PLY STRL (GAUZE/BANDAGES/DRESSINGS) ×2 IMPLANT
GAUZE XEROFORM 1X8 LF (GAUZE/BANDAGES/DRESSINGS) ×2 IMPLANT
GLOVE BIOGEL PI IND STRL 8 (GLOVE) ×2 IMPLANT
GLOVE BIOGEL PI INDICATOR 8 (GLOVE) ×2
GLOVE ORTHO TXT STRL SZ7.5 (GLOVE) ×2 IMPLANT
GLOVE SURG ORTHO 8.0 STRL STRW (GLOVE) ×2 IMPLANT
GOWN STRL REUS W/ TWL LRG LVL3 (GOWN DISPOSABLE) IMPLANT
GOWN STRL REUS W/ TWL XL LVL3 (GOWN DISPOSABLE) ×2 IMPLANT
GOWN STRL REUS W/TWL LRG LVL3 (GOWN DISPOSABLE)
GOWN STRL REUS W/TWL XL LVL3 (GOWN DISPOSABLE) ×2
HANDPIECE INTERPULSE COAX TIP (DISPOSABLE) ×1
IMMOBILIZER KNEE 22 (SOFTGOODS) ×2 IMPLANT
IMMOBILIZER KNEE 22 UNIV (SOFTGOODS) ×2 IMPLANT
KIT BASIN OR (CUSTOM PROCEDURE TRAY) ×2 IMPLANT
KIT ROOM TURNOVER OR (KITS) ×2 IMPLANT
MANIFOLD NEPTUNE II (INSTRUMENTS) ×2 IMPLANT
NDL SAFETY ECLIPSE 18X1.5 (NEEDLE) IMPLANT
NEEDLE HYPO 18GX1.5 SHARP (NEEDLE)
NS IRRIG 1000ML POUR BTL (IV SOLUTION) ×2 IMPLANT
PACK TOTAL JOINT (CUSTOM PROCEDURE TRAY) ×2 IMPLANT
PAD ABD 8X10 STRL (GAUZE/BANDAGES/DRESSINGS) ×2 IMPLANT
PAD ARMBOARD 7.5X6 YLW CONV (MISCELLANEOUS) ×2 IMPLANT
PADDING CAST COTTON 6X4 STRL (CAST SUPPLIES) ×2 IMPLANT
SET HNDPC FAN SPRY TIP SCT (DISPOSABLE) ×1 IMPLANT
SET PAD KNEE POSITIONER (MISCELLANEOUS) ×2 IMPLANT
STAPLER VISISTAT 35W (STAPLE) IMPLANT
STRIP CLOSURE SKIN 1/2X4 (GAUZE/BANDAGES/DRESSINGS) IMPLANT
SUCTION FRAZIER HANDLE 10FR (MISCELLANEOUS) ×1
SUCTION TUBE FRAZIER 10FR DISP (MISCELLANEOUS) ×1 IMPLANT
SUT MNCRL AB 4-0 PS2 18 (SUTURE) IMPLANT
SUT VIC AB 0 CT1 27 (SUTURE) ×1
SUT VIC AB 0 CT1 27XBRD ANBCTR (SUTURE) ×1 IMPLANT
SUT VIC AB 1 CT1 27 (SUTURE) ×2
SUT VIC AB 1 CT1 27XBRD ANBCTR (SUTURE) ×2 IMPLANT
SUT VIC AB 2-0 CT1 27 (SUTURE) ×2
SUT VIC AB 2-0 CT1 TAPERPNT 27 (SUTURE) ×2 IMPLANT
SYR 50ML LL SCALE MARK (SYRINGE) IMPLANT
TOWEL OR 17X24 6PK STRL BLUE (TOWEL DISPOSABLE) ×2 IMPLANT
TOWEL OR 17X26 10 PK STRL BLUE (TOWEL DISPOSABLE) ×2 IMPLANT
TRAY CATH 16FR W/PLASTIC CATH (SET/KITS/TRAYS/PACK) IMPLANT
WRAP KNEE MAXI GEL POST OP (GAUZE/BANDAGES/DRESSINGS) ×2 IMPLANT

## 2017-11-01 NOTE — H&P (Signed)
TOTAL KNEE ADMISSION H&P  Patient is being admitted for right total knee arthroplasty.  Subjective:  Chief Complaint:right knee pain.  HPI: Stephen Scott, 57 y.o. male, has a history of pain and functional disability in the right knee due to arthritis and has failed non-surgical conservative treatments for greater than 12 weeks to includeNSAID's and/or analgesics, corticosteriod injections, viscosupplementation injections, flexibility and strengthening excercises and activity modification.  Onset of symptoms was gradual, starting 5 years ago with gradually worsening course since that time. The patient noted prior procedures on the knee to include  arthroscopy on the right knee(s).  Patient currently rates pain in the right knee(s) at 10 out of 10 with activity. Patient has night pain, worsening of pain with activity and weight bearing, pain that interferes with activities of daily living, pain with passive range of motion, crepitus and joint swelling.  Patient has evidence of subchondral sclerosis, periarticular osteophytes and joint space narrowing by imaging studies. There is no active infection.  Patient Active Problem List   Diagnosis Date Noted  . Chronic pain of right knee 05/24/2017  . Unilateral primary osteoarthritis, right knee 05/24/2017  . Chronic bilateral low back pain with bilateral sciatica 12/08/2016   Past Medical History:  Diagnosis Date  . Anemia    as a child  . Anxiety   . Arthritis   . Depression   . GERD (gastroesophageal reflux disease)   . History of kidney stones   . Hypertension   . Pneumonia    walking pneumonia 08/2017-treated    Past Surgical History:  Procedure Laterality Date  . HAND SURGERY Left    1989, reconstructive  . KNEE SURGERY Right    2008  . MOUTH SURGERY     1980    Current Facility-Administered Medications  Medication Dose Route Frequency Provider Last Rate Last Dose  . ceFAZolin (ANCEF) 3 g in dextrose 5 % 50 mL IVPB  3 g  Intravenous To SS-Surg Kathryne Hitch, MD      . chlorhexidine (HIBICLENS) 4 % liquid 4 application  60 mL Topical Once Richardean Canal W, PA-C      . fentaNYL (SUBLIMAZE) 100 MCG/2ML injection           . lactated ringers infusion   Intravenous Continuous Lowella Curb, MD 10 mL/hr at 11/01/17 1039    . midazolam (VERSED) 2 MG/2ML injection           . tranexamic acid (CYKLOKAPRON) 1,000 mg in sodium chloride 0.9 % 100 mL IVPB  1,000 mg Intravenous To OR Kathryne Hitch, MD       Facility-Administered Medications Ordered in Other Encounters  Medication Dose Route Frequency Provider Last Rate Last Dose  . lactated ringers infusion   Intravenous Continuous PRN Julian Reil, CRNA       Allergies  Allergen Reactions  . Iodides Anaphylaxis, Hives and Other (See Comments)    Immediate reaction, looks like chicken pox  . Tetanus Toxoids Other (See Comments)    Big red, fever in arm > Local reaction    Social History   Tobacco Use  . Smoking status: Never Smoker  . Smokeless tobacco: Never Used  Substance Use Topics  . Alcohol use: Yes    Alcohol/week: 1.8 oz    Types: 3 Glasses of wine per week    Comment: weekly    History reviewed. No pertinent family history.   Review of Systems  Musculoskeletal: Positive for back pain and joint pain.  All other systems reviewed and are negative.   Objective:  Physical Exam  Constitutional: He is oriented to person, place, and time. He appears well-developed and well-nourished.  HENT:  Head: Normocephalic and atraumatic.  Eyes: EOM are normal. Pupils are equal, round, and reactive to light.  Neck: Normal range of motion. Neck supple.  Cardiovascular: Normal rate and regular rhythm.  Respiratory: Effort normal and breath sounds normal.  GI: Soft. Bowel sounds are normal.  Musculoskeletal:       Right knee: He exhibits decreased range of motion, effusion and abnormal alignment. Tenderness found. Medial joint line and  lateral joint line tenderness noted.  Neurological: He is alert and oriented to person, place, and time.  Skin: Skin is warm.  Psychiatric: He has a normal mood and affect.    Vital signs in last 24 hours: Temp:  [98.4 F (36.9 C)] 98.4 F (36.9 C) (02/05 1028) Pulse Rate:  [88] 88 (02/05 1028) Resp:  [18-22] 22 (02/05 1049) BP: (196-200)/(85-100) 196/85 (02/05 1049) SpO2:  [98 %] 98 % (02/05 1028) Weight:  [261 lb 1.6 oz (118.4 kg)] 261 lb 1.6 oz (118.4 kg) (02/05 1028)  Labs:   Estimated body mass index is 36.42 kg/m as calculated from the following:   Height as of this encounter: 5\' 11"  (1.803 m).   Weight as of this encounter: 261 lb 1.6 oz (118.4 kg).   Imaging Review Plain radiographs demonstrate severe degenerative joint disease of the right knee(s). The overall alignment ismild valgus. The bone quality appears to be excellent for age and reported activity level.  Assessment/Plan:  End stage arthritis, right knee   The patient history, physical examination, clinical judgment of the provider and imaging studies are consistent with end stage degenerative joint disease of the right knee(s) and total knee arthroplasty is deemed medically necessary. The treatment options including medical management, injection therapy arthroscopy and arthroplasty were discussed at length. The risks and benefits of total knee arthroplasty were presented and reviewed. The risks due to aseptic loosening, infection, stiffness, patella tracking problems, thromboembolic complications and other imponderables were discussed. The patient acknowledged the explanation, agreed to proceed with the plan and consent was signed. Patient is being admitted for inpatient treatment for surgery, pain control, PT, OT, prophylactic antibiotics, VTE prophylaxis, progressive ambulation and ADL's and discharge planning. The patient is planning to be discharged home with home health services

## 2017-11-01 NOTE — Anesthesia Preprocedure Evaluation (Signed)
Anesthesia Evaluation  Patient identified by MRN, date of birth, ID band Patient awake    Reviewed: Allergy & Precautions, NPO status , Patient's Chart, lab work & pertinent test results  Airway Mallampati: II  TM Distance: >3 FB Neck ROM: Full    Dental no notable dental hx.    Pulmonary neg pulmonary ROS,    Pulmonary exam normal breath sounds clear to auscultation       Cardiovascular hypertension, Pt. on medications negative cardio ROS Normal cardiovascular exam Rhythm:Regular Rate:Normal     Neuro/Psych Anxiety Depression negative neurological ROS  negative psych ROS   GI/Hepatic negative GI ROS, Neg liver ROS, GERD  ,  Endo/Other  negative endocrine ROS  Renal/GU negative Renal ROS  negative genitourinary   Musculoskeletal negative musculoskeletal ROS (+) Arthritis , Osteoarthritis,    Abdominal   Peds negative pediatric ROS (+)  Hematology negative hematology ROS (+)   Anesthesia Other Findings   Reproductive/Obstetrics negative OB ROS                             Anesthesia Physical Anesthesia Plan  ASA: II  Anesthesia Plan: Spinal and Regional   Post-op Pain Management:  Regional for Post-op pain   Induction: Intravenous  PONV Risk Score and Plan: 1 and Ondansetron  Airway Management Planned: Simple Face Mask  Additional Equipment:   Intra-op Plan:   Post-operative Plan:   Informed Consent: I have reviewed the patients History and Physical, chart, labs and discussed the procedure including the risks, benefits and alternatives for the proposed anesthesia with the patient or authorized representative who has indicated his/her understanding and acceptance.   Dental advisory given  Plan Discussed with: CRNA  Anesthesia Plan Comments:         Anesthesia Quick Evaluation

## 2017-11-01 NOTE — Brief Op Note (Signed)
11/01/2017  1:41 PM  PATIENT:  Stephen Scott  57 y.o. male  PRE-OPERATIVE DIAGNOSIS:  osteoarthritis right knee  POST-OPERATIVE DIAGNOSIS:  osteoarthritis right knee  PROCEDURE:  Procedure(s): RIGHT TOTAL KNEE ARTHROPLASTY (Right)  SURGEON:  Surgeon(s) and Role:    Kathryne Hitch* Blackman, Christopher Y, MD - Primary  PHYSICIAN ASSISTANT: Rexene EdisonGil Clark, PA-C  ANESTHESIA:   local, regional and spinal  COUNTS:  YES  TOURNIQUET: less than one hour  PLAN OF CARE: Admission to inpatient  PATIENT DISPOSITION:  PACU - hemodynamically stable.   Delay start of Pharmacological VTE agent (>24hrs) due to surgical blood loss or risk of bleeding: no

## 2017-11-01 NOTE — Progress Notes (Signed)
Orthopedic Tech Progress Note Patient Details:  Sinclair ShipJohn Howdyshell [birth year] [id number]  Ortho Devices Ortho Device/Splint Interventions: Application   Post Interventions Patient Tolerated: Well Instructions Provided: Care of device, Adjustment of device   Saul FordyceJennifer C Asaiah Hunnicutt 11/01/2017, 5:25 PM

## 2017-11-01 NOTE — Anesthesia Procedure Notes (Signed)
Anesthesia Regional Block: Adductor canal block   Pre-Anesthetic Checklist: ,, timeout performed, Correct Patient, Correct Site, Correct Laterality, Correct Procedure, Correct Position, site marked, Risks and benefits discussed,  Surgical consent,  Pre-op evaluation,  At surgeon's request and post-op pain management  Laterality: Right  Prep: chloraprep       Needles:  Injection technique: Single-shot  Needle Type: Stimiplex     Needle Length: 9cm  Needle Gauge: 21     Additional Needles:   Procedures:,,,, ultrasound used (permanent image in chart),,,,  Narrative:  Start time: 11/01/2017 11:46 AM End time: 11/01/2017 11:51 AM Injection made incrementally with aspirations every 5 mL.  Performed by: Personally  Anesthesiologist: Lowella CurbMiller, Kiaan Overholser Ray, MD

## 2017-11-01 NOTE — Anesthesia Procedure Notes (Signed)
Spinal  Patient location during procedure: OR Start time: 11/01/2017 12:10 PM End time: 11/01/2017 12:15 PM Staffing Anesthesiologist: Lowella CurbMiller, Naamah Boggess Ray, MD Performed: anesthesiologist  Preanesthetic Checklist Completed: patient identified, site marked, surgical consent, pre-op evaluation, timeout performed, IV checked, risks and benefits discussed and monitors and equipment checked Spinal Block Patient position: sitting Prep: Betadine Patient monitoring: heart rate, cardiac monitor, continuous pulse ox and blood pressure Approach: midline Location: L3-4 Injection technique: single-shot Needle Needle type: Pencan  Needle gauge: 24 G Needle length: 9 cm

## 2017-11-01 NOTE — Progress Notes (Signed)
Orthopedic Tech Progress Note Patient Details:  Stephen ShipJohn Scott [birth year] [id number]  CPM Right Knee CPM Right Knee: On Right Knee Flexion (Degrees): 90 Right Knee Extension (Degrees): 0 Additional Comments: applied cpm to pt right knee at 90-0 degrees.  pt tolerated application very well.  Right knee/leg.  Post Interventions Patient Tolerated: Well Instructions Provided: Care of device  Alvina ChouWilliams, [name] 11/01/2017, 2:54 PM

## 2017-11-01 NOTE — Transfer of Care (Signed)
Immediate Anesthesia Transfer of Care Note  Patient: Stephen Scott  Procedure(s) Performed: RIGHT TOTAL KNEE ARTHROPLASTY (Right Knee)  Patient Location: PACU  Anesthesia Type:Regional and Spinal  Level of Consciousness: awake, oriented and patient cooperative  Airway & Oxygen Therapy: Patient Spontanous Breathing and Patient connected to face mask oxygen  Post-op Assessment: Report given to RN and Post -op Vital signs reviewed and stable  Post vital signs: Reviewed and stable  Last Vitals:  Vitals:   11/01/17 1150 11/01/17 1155  BP: (!) 176/76   Pulse: 77 76  Resp: 10 12  Temp:    SpO2: 99% 98%    Last Pain:  Vitals:   11/01/17 1028  TempSrc: Oral         Complications: No apparent anesthesia complications

## 2017-11-01 NOTE — Anesthesia Procedure Notes (Signed)
Procedure Name: MAC Date/Time: 11/01/2017 12:28 PM Performed by: Orlie Dakin, CRNA Pre-anesthesia Checklist: Patient identified, Emergency Drugs available, Suction available, Patient being monitored and Timeout performed Patient Re-evaluated:Patient Re-evaluated prior to induction Oxygen Delivery Method: Simple face mask

## 2017-11-02 ENCOUNTER — Encounter (HOSPITAL_COMMUNITY): Payer: Self-pay | Admitting: Orthopaedic Surgery

## 2017-11-02 LAB — BASIC METABOLIC PANEL
ANION GAP: 13 (ref 5–15)
BUN: 16 mg/dL (ref 6–20)
CALCIUM: 8.3 mg/dL — AB (ref 8.9–10.3)
CO2: 19 mmol/L — AB (ref 22–32)
Chloride: 104 mmol/L (ref 101–111)
Creatinine, Ser: 0.91 mg/dL (ref 0.61–1.24)
GFR calc Af Amer: >60 mL/min (ref >60)
GFR calc non Af Amer: >60 mL/min (ref >60)
GLUCOSE: 158 mg/dL — AB (ref 65–99)
Potassium: 4.1 mmol/L (ref 3.5–5.1)
Sodium: 136 mmol/L (ref 135–145)

## 2017-11-02 LAB — CBC
HEMATOCRIT: 41.1 % (ref 39.0–52.0)
Hemoglobin: 13.5 g/dL (ref 13.0–17.0)
MCH: 29.3 pg (ref 26.0–34.0)
MCHC: 32.8 g/dL (ref 30.0–36.0)
MCV: 89.2 fL (ref 78.0–100.0)
Platelets: 194 10*3/uL (ref 150–400)
RBC: 4.61 MIL/uL (ref 4.22–5.81)
RDW: 14.1 % (ref 11.5–15.5)
WBC: 12.4 10*3/uL — AB (ref 4.0–10.5)

## 2017-11-02 NOTE — Progress Notes (Signed)
Subjective: 1 Day Post-Op Procedure(s) (LRB): RIGHT TOTAL KNEE ARTHROPLASTY (Right) Patient reports pain as moderate.    Objective: Vital signs in last 24 hours: Temp:  [97.8 F (36.6 C)-98.4 F (36.9 C)] 98.1 F (36.7 C) (02/06 0432) Pulse Rate:  [64-88] 76 (02/06 0432) Resp:  [7-23] 18 (02/06 0432) BP: (66-203)/(39-100) 169/82 (02/06 0432) SpO2:  [95 %-100 %] 95 % (02/06 0432) Weight:  [261 lb 1.6 oz (118.4 kg)] 261 lb 1.6 oz (118.4 kg) (02/05 1028)  Intake/Output from previous day: 02/05 0701 - 02/06 0700 In: 2824 [P.O.:724; I.V.:2000; IV Piggyback:100] Out: 1675 [Urine:1675] Intake/Output this shift: No intake/output data recorded.  Recent Labs    11/02/17 0602  HGB 13.5   Recent Labs    11/02/17 0602  WBC 12.4*  RBC 4.61  HCT 41.1  PLT 194   Recent Labs    11/02/17 0602  NA 136  K 4.1  CL 104  CO2 19*  BUN 16  CREATININE 0.91  GLUCOSE 158*  CALCIUM 8.3*   No results for input(s): LABPT, INR in the last 72 hours.  Sensation intact distally Intact pulses distally Dorsiflexion/Plantar flexion intact Incision: dressing C/D/I Compartment soft  Assessment/Plan: 1 Day Post-Op Procedure(s) (LRB): RIGHT TOTAL KNEE ARTHROPLASTY (Right) Up with therapy Plan for discharge tomorrow Discharge home with home health  Kathryne HitchChristopher Y Yanelly Cantrelle 11/02/2017, 7:54 AM

## 2017-11-02 NOTE — Progress Notes (Signed)
Physical Therapy Treatment Patient Details Name: Stephen Scott MRN: [id number] DOB: [birth year] Today's Date: 11/02/2017    History of Present Illness 57 y.o. male s/p R TKA. PMH includes: HTN, Spinal Stenosis, Anxiety, Chronic Low Back Pain    PT Comments    Patient progressing slowly with therapy at this time. Sessions continue to be limited by high pain, pt also just finished with OT session this PM. Pt states his pain too high to ambulate (but eventually agreed), focused on functional transfers, therex supine and bedside, and short distance ambulation this visit. Pt very painful and BP running high, reported to RN. Given lack of progress today, will focus on stair training tomorrow BID in hopes that pain is better controled. Patient has great strength and stability in transfers and with supervision will be safe to return home once medically ready for d/c.     Follow Up Recommendations  Home health PT;Supervision for mobility/OOB;DC plan and follow up therapy as arranged by surgeon     Equipment Recommendations  3in1 (PT)    Recommendations for Other Services       Precautions / Restrictions Precautions Precautions: Fall;Knee Precaution Booklet Issued: No Precaution Comments: Reviewed knee precautions related to ADL and no pillow under knee.  Restrictions Weight Bearing Restrictions: Yes RLE Weight Bearing: Weight bearing as tolerated    Mobility  Bed Mobility Overal bed mobility: Needs Assistance Bed Mobility: Supine to Sit     Supine to sit: Supervision     General bed mobility comments: supervision and cues for LLE and blanket to assist RLE over EOB and back into bed  Transfers Overall transfer level: Needs assistance Equipment used: Rolling walker (2 wheeled) Transfers: Sit to/from Stand Sit to Stand: Min guard         General transfer comment: min guard x1 sit to stand, progressed to supervision x3 sit to stands to [facility] for [facility], pt able to do without  physical assistance and good hand placement.   Ambulation/Gait Ambulation/Gait assistance: Min guard Ambulation Distance (Feet): 30 Feet Assistive device: Rolling walker (2 wheeled) Gait Pattern/deviations: Step-to pattern;Antalgic Gait velocity: decreased   General Gait Details: limited by pain. slow and painful, short step length bilaterally. Patient progressed to min guard, good balance.    Stairs            Wheelchair Mobility    Modified Rankin (Stroke Patients Only)       Balance Overall balance assessment: Needs assistance Sitting-balance support: Feet unsupported;No upper extremity supported Sitting balance-Leahy Scale: Good     Standing balance support: During functional activity;Bilateral upper extremity supported Standing balance-Leahy Scale: Poor Standing balance comment: Reliant on B UE support throughout OT eval.                             Cognition Arousal/Alertness: Awake/alert Behavior During Therapy: WFL for tasks assessed/performed Overall Cognitive Status: Within Functional Limits for tasks assessed                                        Exercises Total Joint Exercises Ankle Circles/Pumps: 20 reps Quad Sets: 10 reps Short Arc Quad: 10 reps Heel Slides: 10 reps Hip ABduction/ADduction: 15 reps Long Arc Quad: 10 reps Knee Flexion: 10 reps Goniometric ROM: 90 flexion     General Comments General comments (skin integrity, edema, etc.): wife present for  session. BP: 176/70      Pertinent Vitals/Pain Pain Assessment: Faces Faces Pain Scale: Hurts worst Pain Location: R knee Pain Descriptors / Indicators: Discomfort;Grimacing;Aching Pain Intervention(s): Limited activity within patient's tolerance;Premedicated before session;Monitored during session    Home Living Family/patient expects to be discharged to:: Private residence Living Arrangements: Spouse/significant other;Children Available Help at Discharge:  Family;Available 24 hours/day(wife) Type of Home: House Home Access: Stairs to enter Entrance Stairs-Rails: None Home Layout: One level Home Equipment: Environmental consultant - 2 wheels;Shower seat - built in      Prior Function Level of Independence: Independent          PT Goals (current goals can now be found in the care plan section) Acute Rehab PT Goals Patient Stated Goal: less pain PT Goal Formulation: With patient/family Time For Goal Achievement: 11/09/17 Potential to Achieve Goals: Good Progress towards PT goals: Progressing toward goals    Frequency    7X/week      PT Plan Current plan remains appropriate    Co-evaluation              AM-PAC PT "6 Clicks" Daily Activity  Outcome Measure  Difficulty turning over in bed (including adjusting bedclothes, sheets and blankets)?: A Little Difficulty moving from lying on back to sitting on the side of the bed? : A Little Difficulty sitting down on and standing up from a chair with arms (e.g., wheelchair, bedside commode, etc,.)?: A Little Help needed moving to and from a bed to chair (including a wheelchair)?: A Little Help needed walking in hospital room?: A Little Help needed climbing 3-5 steps with a railing? : A Lot 6 Click Score: 17    End of Session Equipment Utilized During Treatment: Gait belt Activity Tolerance: Patient limited by pain Patient left: in bed;with call bell/phone within reach;with family/visitor present Nurse Communication: Mobility status(red skin, BP reported) PT Visit Diagnosis: Unsteadiness on feet (R26.81);Other abnormalities of gait and mobility (R26.89);Pain;Difficulty in walking, not elsewhere classified (R26.2) Pain - Right/Left: Right Pain - part of body: Knee     Time: 1630-1720 PT Time Calculation (min) (ACUTE ONLY): 50 min  Charges:  $Gait Training: 8-22 mins $Therapeutic Exercise: 8-22 mins $Therapeutic Activity: 8-22 mins                    G Codes:       Etta Grandchild,  PT, DPT Acute Rehab Services Pager: [phone]     Etta Grandchild 11/02/2017, 5:56 PM

## 2017-11-02 NOTE — Op Note (Signed)
NAMEMarland Kitchen  RANBIR, CHEW NO.:  [id number]  MEDICAL RECORD NO.:  [id number]  LOCATION:  6N11C                        FACILITY:  [facility]  PHYSICIAN:  Vanita Panda. Magnus Ivan, M.D.DATE OF BIRTH:  19-Dec-1960  DATE OF PROCEDURE:  11/01/2017 DATE OF DISCHARGE:                              OPERATIVE REPORT   PREOPERATIVE DIAGNOSIS:  Severe osteoarthritis and degenerative joint disease, right knee.  POSTOPERATIVE DIAGNOSIS:  Severe osteoarthritis and degenerative joint disease, right knee.  PROCEDURE:  Right total knee arthroplasty.  IMPLANTS:  Stryker Triathlon press-fit knee system with size 4 press-fit femur, size 5 press-fit tibial tray, 9 mm thickness fixed bearing polyethylene insert, size 35 press-fit patellar button.  SURGEON:  Vanita Panda. Magnus Ivan, MD.  ASSISTANT:  Richardean Canal, PA-C.  ANESTHESIA: 1. Right lower extremity adductor canal block. 2. Spinal. 3. Local with a mixture of 20 mL of Exparel with 20 mL of plain     Marcaine and 20 mL of saline.  ANTIBIOTICS:  2 g of IV Ancef.  TOURNIQUET TIME:  Less than 1 hour.  BLOOD LOSS:  Less than 100 mL.  COMPLICATIONS:  None.  INDICATIONS:  Mr. Bellis is a 57 year old, actual neighbor of mine, who we followed for a while for severe right knee pain.  He has a remote history of a right knee arthroscopy.  X-rays at this point show complete loss of the lateral joint space of his knee with slight valgus malalignment.  His pain is daily and it has detrimentally affected his activities of daily living, his quality of life, his mobility.  We have tried antiinflammatories as well as activity modification and rest.  We have tried steroid injections in his knee as well as a hyaluronic acid injection.  With the failure of these, we recommended a total knee arthroplasty.  He understands fully the risks of acute blood loss anemia, nerve and vessel injury, fracture, infection, and DVT.  He understands our goals  are to decrease pain, improve mobility, and overall improved quality of life.  DESCRIPTION OF PROCEDURE:  After informed consent was obtained, appropriate right knee was marked.  Anesthesia obtained an adductor canal block in the holding room.  He was then brought to the operating room and sat up on the operating table.  Spinal anesthesia was obtained. He was then laid in a supine position on the operating table.  Foley catheter was placed.  A nonsterile tourniquet was placed around his upper right thigh.  His right leg was prepped and draped from the thigh down the toes with DuraPrep and sterile drapes.  Time-out was called including a sterile stockinette.  A time-out was called.  He was identified as correct patient and correct right knee.  We then used an Esmarch to wrap out the leg and tourniquet was inflated to 300 mmHg of pressure.  I then made a direct midline incision over the patella and carried this proximally and distally.  We dissected down the knee joint and carried out a medial parapatellar arthrotomy.  We found a large joint effusion and significant osteoarthritis and mainly the lateral compartment of the patellofemoral joint.  He had significant loss of cartilage on the lateral femoral condyle  in a wide area.  With the knee in a flexed position, we removed remnants of ACL, PCL, medial and lateral meniscus and set our extramedullary cutting guide for the tibia for taking 9 mm off the high side correcting for varus and valgus and neutral slope.  We made this cut without difficulty.  We then went to the femur and used the intramedullary drill through the notch of the femur for intramedullary distal femoral cutting guide, setting this for 5 degrees externally rotated for a right knee and an 8 mm distal femoral cut.  We made this cut without difficulty as well.  We brought the knee back down to full extension with a 9 mm extension block and achieved full extension.  We then  went back to the femur and put our femoral sizing guide based off the epicondylar axis and Whiteside's line, setting this for 3 degrees for a right knee.  We chose a size 4 femur. We then put our 4-in-1 cutting block for a size 4 femur.  We made our anterior and posterior cuts, followed by our chamfer cuts.  We then made our femoral box cut.  We then went to the tibia and chose a size 5 tibia for coverage of the tibial plateau and set the rotation off the tibial tubercle and the femur.  We then made our keel cut off this.  We then did trial size 5 tibial tray, followed by the size 4 right femur.  We tried a 9 mm fix-bearing polyethylene insert and we were pleased with the range of motion and stability of the knee.  We then made our patellar cut and drilled 3 holes for a press-fit size 35 patellar button.  We then removed all instrumentation from the knee and irrigated the knee with normal saline solution.  We then placed a mixture of Exparel with plain Marcaine and saline throughout the capsule of the right knee.  We then dried the knee and with the knee in a flexed position, we placed our real Stryker press-fit triathlon tibial tray size 5, followed by the real size 3 press-fit right femur.  We placed our real 9-mm fix-bearing polyethylene insert and press-fit our patellar button size 35.  We then put the knee through range of motion.  I was pleased with stability.  We then closed the arthrotomy with interrupted #1 Vicryl suture, followed by 0 Vicryl in the deep tissue, 2-0 Vicryl in the subcutaneous tissue, 4-0 Monocryl subcuticular stitch and Steri- Strips on the skin.  A well-padded sterile dressing was applied.  He was taken to the recovery room in stable condition.  All final counts were correct.  There were no complications noted.  Of note, Richardean CanalGilbert Clark, PA- C, assisted in the entire case.  His assistance was crucial for facilitating all aspects of this case.     Vanita Pandahristopher  Y. Magnus IvanBlackman, M.D.     CYB/MEDQ  D:  11/01/2017  T:  11/02/2017  Job:  [id number]

## 2017-11-02 NOTE — Evaluation (Signed)
Physical Therapy Evaluation Patient Details Name: Stephen Scott MRN: [id number] DOB: 06-21-1961 Today's Date: 11/02/2017   History of Present Illness  57 y.o. male s/p R TKA. PMH includes: HTN, Spinal Stenosis, Anxiety, Chronic Low Back Pain  Clinical Impression  Patient is s/p above surgery resulting in functional limitations due to the deficits listed below (see PT Problem List). PTA, patient mod I with all mobility, living with wife in 1 story home with 3 stairs w/o rails to enter. Upon evaluation, patient presents with moderate-high levels of post op pain and weakness that limit his mobility. Currently min guard level for OOB mobility and ambulating short distances outside hospital room. Patient reports he feels more comfortable returning home Friday, and that he had a poor nights rest with high levels of pain. Discussed goals for next PT visit and will likely focus on stair training tomorrow given patients current presentation, pt and wife agreeable.  Patient will benefit from skilled PT to increase their independence and safety with mobility to allow discharge to the venue listed below.       Follow Up Recommendations Home health PT;Supervision for mobility/OOB;DC plan and follow up therapy as arranged by surgeon    Equipment Recommendations  3in1 (PT)    Recommendations for Other Services       Precautions / Restrictions Precautions Precautions: Fall;Knee Precaution Booklet Issued: Yes (comment) Precaution Comments: Reviewed supine therex and no pillow under knee  Restrictions Weight Bearing Restrictions: Yes RLE Weight Bearing: Weight bearing as tolerated      Mobility  Bed Mobility Overal bed mobility: Needs Assistance Bed Mobility: Supine to Sit     Supine to sit: Supervision     General bed mobility comments: able to supine to sit with increased time and effort, but without physical assistance.   Transfers Overall transfer level: Needs assistance Equipment used:  Rolling walker (2 wheeled) Transfers: Sit to/from Stand Sit to Stand: Min assist         General transfer comment: Min A to power up from elevated surface  Ambulation/Gait Ambulation/Gait assistance: Min guard Ambulation Distance (Feet): 40 Feet Assistive device: Rolling walker (2 wheeled) Gait Pattern/deviations: Step-to pattern;Antalgic Gait velocity: decreased   General Gait Details: slow and painful, short step length bilaterally. Patient progressed to min guard, good balance.   Stairs            Wheelchair Mobility    Modified Rankin (Stroke Patients Only)       Balance Overall balance assessment: Needs assistance Sitting-balance support: Feet unsupported;No upper extremity supported Sitting balance-Leahy Scale: Good     Standing balance support: During functional activity;Bilateral upper extremity supported Standing balance-Leahy Scale: Fair Standing balance comment: RW for dynamic balance.                              Pertinent Vitals/Pain Pain Assessment: 0-10 Pain Score: 7  Pain Location: R knee Pain Descriptors / Indicators: Discomfort;Grimacing;Aching Pain Intervention(s): Limited activity within patient's tolerance;Monitored during session;Repositioned    Home Living Family/patient expects to be discharged to:: Private residence Living Arrangements: Spouse/significant other;Children Available Help at Discharge: Family;Available 24 hours/day(Wife) Type of Home: House Home Access: Stairs to enter Entrance Stairs-Rails: None Entrance Stairs-Number of Steps: 3 Home Layout: One level Home Equipment: Walker - 2 wheels;Shower seat - built in      Prior Function Level of Independence: Independent  Hand Dominance        Extremity/Trunk Assessment   Upper Extremity Assessment Upper Extremity Assessment: Defer to OT evaluation;Overall WFL for tasks assessed    Lower Extremity Assessment Lower Extremity  Assessment: (RLE 3-/5 gross post op LLE 5/5 globally. )    Cervical / Trunk Assessment Cervical / Trunk Assessment: Normal  Communication   Communication: No difficulties  Cognition Arousal/Alertness: Awake/alert Behavior During Therapy: WFL for tasks assessed/performed Overall Cognitive Status: Within Functional Limits for tasks assessed                                        General Comments      Exercises Total Joint Exercises Ankle Circles/Pumps: 20 reps Quad Sets: 10 reps Heel Slides: 10 reps   Assessment/Plan    PT Assessment Patient needs continued PT services  PT Problem List Decreased strength;Decreased range of motion;Decreased balance;Decreased activity tolerance;Pain;Decreased mobility       PT Treatment Interventions DME instruction;Gait training;Stair training;Functional mobility training;Therapeutic activities;Therapeutic exercise;Balance training    PT Goals (Current goals can be found in the Care Plan section)  Acute Rehab PT Goals Patient Stated Goal: return home friday PT Goal Formulation: With patient/family Time For Goal Achievement: 11/09/17 Potential to Achieve Goals: Good    Frequency 7X/week   Barriers to discharge        Co-evaluation               AM-PAC PT "6 Clicks" Daily Activity  Outcome Measure Difficulty turning over in bed (including adjusting bedclothes, sheets and blankets)?: A Little Difficulty moving from lying on back to sitting on the side of the bed? : A Little Difficulty sitting down on and standing up from a chair with arms (e.g., wheelchair, bedside commode, etc,.)?: A Little Help needed moving to and from a bed to chair (including a wheelchair)?: A Little Help needed walking in hospital room?: A Little Help needed climbing 3-5 steps with a railing? : A Lot 6 Click Score: 17    End of Session Equipment Utilized During Treatment: Gait belt Activity Tolerance: Patient limited by pain Patient  left: in chair;with call bell/phone within reach;with family/visitor present Nurse Communication: Mobility status PT Visit Diagnosis: Unsteadiness on feet (R26.81);Other abnormalities of gait and mobility (R26.89);Pain;Difficulty in walking, not elsewhere classified (R26.2) Pain - Right/Left: Right Pain - part of body: Knee    Time: [id number] PT Time Calculation (min) (ACUTE ONLY): 44 min   Charges:   PT Evaluation $PT Eval Low Complexity: 1 Low PT Treatments $Gait Training: 8-22 mins $Therapeutic Exercise: 8-22 mins   PT G Codes:       Etta GrandchildSean Myrtice Lowdermilk, PT, DPT Acute Rehab Services Pager: [phone]    Etta GrandchildSean  Tesslyn Baumert 11/02/2017, 12:12 PM

## 2017-11-02 NOTE — Care Management Note (Signed)
Case Management Note  Patient Details  Name: Stephen Scott MRN: [id number] Date of Birth: [birth year]  Subjective/Objective:                    Action/Plan:  Patient has a walker already , needs 3 n 1 , [facility] Expected Discharge Date:                  Expected Discharge Plan:  Home w Home Health Services  In-House Referral:     Discharge planning Services  CM Consult  Post Acute Care Choice:  Home Health, Durable Medical Equipment Choice offered to:  Patient, Spouse  DME Arranged:  3-N-1 DME Agency:  [facility]  HH Arranged:  PT [facility]  Status of Service:  Completed, signed off  If discussed at Long Length of Stay Meetings, dates discussed:    Additional Comments:  Kingsley PlanWile, [name], RN 11/02/2017, 10:48 AM

## 2017-11-02 NOTE — Evaluation (Signed)
Occupational Therapy Evaluation Patient Details Name: Stephen Scott MRN: [id number] DOB: [birth year] Today's Date: 11/02/2017    History of Present Illness 57 y.o. male s/p R TKA. PMH includes: HTN, Spinal Stenosis, Anxiety, Chronic Low Back Pain   Clinical Impression   PTA, pt was independent with ADL and functional mobility but limited secondary to R knee pain. Pt currently requiring max assist for LB ADL and min assist for toilet transfers. Initiated education concerning knee precautions related to ADL with pt and wife and they would benefit from continued reinforcement. Pt would benefit from continued OT services while admitted to maximize safety and independence with ADL and functional mobility. Feel pt will likely make good progress with acute OT and do not anticipate need for continued OT follow-up post-acute D/C. Recommend 3-in-1 BSC for equipment.     Follow Up Recommendations  No OT follow up;Supervision/Assistance - 24 hour    Equipment Recommendations  3 in 1 bedside commode    Recommendations for Other Services       Precautions / Restrictions Precautions Precautions: Fall;Knee Precaution Booklet Issued: No Precaution Comments: Reviewed knee precautions related to ADL and no pillow under knee.  Restrictions Weight Bearing Restrictions: Yes RLE Weight Bearing: Weight bearing as tolerated      Mobility Bed Mobility Overal bed mobility: Needs Assistance Bed Mobility: Supine to Sit     Supine to sit: Min assist     General bed mobility comments: Requiring assist to scoot hips to EOB and manage RLE.   Transfers Overall transfer level: Needs assistance Equipment used: Rolling walker (2 wheeled) Transfers: Sit to/from Stand Sit to Stand: Min assist         General transfer comment: Significant cues for safe hand placement. Min assist to power up.     Balance Overall balance assessment: Needs assistance Sitting-balance support: Feet unsupported;No upper  extremity supported Sitting balance-Leahy Scale: Good     Standing balance support: During functional activity;Bilateral upper extremity supported Standing balance-Leahy Scale: Poor Standing balance comment: Reliant on B UE support throughout OT eval.                            ADL either performed or assessed with clinical judgement   ADL Overall ADL's : Needs assistance/impaired Eating/Feeding: Set up;Sitting   Grooming: Set up;Sitting   Upper Body Bathing: Minimal assistance;Sitting   Lower Body Bathing: Sit to/from stand;Maximal assistance   Upper Body Dressing : Minimal assistance;Sitting   Lower Body Dressing: Sit to/from stand;Maximal assistance   Toilet Transfer: Ambulation;RW;BSC;Minimal assistance(BSC over toilet)   Toileting- Clothing Manipulation and Hygiene: Min guard;Sit to/from stand       Functional mobility during ADLs: Minimal assistance;Rolling walker General ADL Comments: Min assist to power up to standing with toilet transfers. Very limited by pain this session. Wife present and engaged and eager to assist.      Vision Baseline Vision/History: Wears glasses Wears Glasses: At all times Patient Visual Report: No change from baseline Vision Assessment?: No apparent visual deficits     Perception     Praxis      Pertinent Vitals/Pain Pain Assessment: Faces Faces Pain Scale: Hurts whole lot Pain Location: R knee Pain Descriptors / Indicators: Discomfort;Grimacing;Aching Pain Intervention(s): Limited activity within patient's tolerance;Monitored during session;Repositioned     Hand Dominance     Extremity/Trunk Assessment Upper Extremity Assessment Upper Extremity Assessment: Overall WFL for tasks assessed   Lower Extremity Assessment Lower Extremity Assessment: RLE  deficits/detail RLE Deficits / Details: Decreased strength and ROM as expected post-operatively.    Cervical / Trunk Assessment Cervical / Trunk Assessment: Normal    Communication Communication Communication: No difficulties   Cognition Arousal/Alertness: Awake/alert Behavior During Therapy: WFL for tasks assessed/performed                                       General Comments  Wife present and engaged in session.     Exercises     Shoulder Instructions      Home Living Family/patient expects to be discharged to:: Private residence Living Arrangements: Spouse/significant other;Children Available Help at Discharge: Family;Available 24 hours/day(wife) Type of Home: House Home Access: Stairs to enter Entergy CorporationEntrance Stairs-Number of Steps: 3 Entrance Stairs-Rails: None Home Layout: One level     Bathroom Shower/Tub: Chief Strategy OfficerTub/shower unit   Bathroom Toilet: Standard Bathroom Accessibility: Yes   Home Equipment: Environmental consultantWalker - 2 wheels;Shower seat - built in          Prior Functioning/Environment Level of Independence: Independent                 OT Problem List: Decreased strength;Decreased range of motion;Decreased activity tolerance;Impaired balance (sitting and/or standing);Decreased safety awareness;Decreased knowledge of use of DME or AE;Decreased knowledge of precautions;Pain      OT Treatment/Interventions: Self-care/ADL training;Therapeutic exercise;Energy conservation;DME and/or AE instruction;Therapeutic activities;Patient/family education;Balance training    OT Goals(Current goals can be found in the care plan section) Acute Rehab OT Goals Patient Stated Goal: less pain OT Goal Formulation: With patient Time For Goal Achievement: 11/16/17 Potential to Achieve Goals: Good ADL Goals Pt Will Perform Grooming: with supervision;standing Pt Will Perform Lower Body Dressing: with min assist;sit to/from stand Pt Will Transfer to Toilet: with supervision;bedside commode;ambulating(BSC over toilet) Pt Will Perform Toileting - Clothing Manipulation and hygiene: with supervision;sit to/from stand Pt Will Perform Tub/Shower  Transfer: with min assist;rolling walker;Shower transfer;shower seat  OT Frequency: Min 2X/week   Barriers to D/C:            Co-evaluation              AM-PAC PT "6 Clicks" Daily Activity     Outcome Measure Help from another person eating meals?: A Little Help from another person taking care of personal grooming?: A Little Help from another person toileting, which includes using toliet, bedpan, or urinal?: A Little Help from another person bathing (including washing, rinsing, drying)?: A Lot Help from another person to put on and taking off regular upper body clothing?: A Little Help from another person to put on and taking off regular lower body clothing?: A Lot 6 Click Score: 16   End of Session Equipment Utilized During Treatment: Gait belt;Rolling walker CPM Right Knee CPM Right Knee: Off Nurse Communication: Mobility status  Activity Tolerance: Patient tolerated treatment well Patient left: in bed;with call bell/phone within reach;with family/visitor present  OT Visit Diagnosis: Other abnormalities of gait and mobility (R26.89);Pain Pain - Right/Left: Right Pain - part of body: Knee                Time: 9629-52841507-1545 OT Time Calculation (min): 38 min Charges:  OT General Charges $OT Visit: 1 Visit OT Evaluation $OT Eval Moderate Complexity: 1 Mod OT Treatments $Self Care/Home Management : 23-37 mins G-Codes:     Doristine Sectionharity A Bion Todorov, MS OTR/L  Pager: [phone]   Barrett Holthaus A Itha Kroeker 11/02/2017, 5:40 PM

## 2017-11-02 NOTE — Anesthesia Postprocedure Evaluation (Signed)
Anesthesia Post Note  Patient: Stephen ShipJohn Scott  Procedure(s) Performed: RIGHT TOTAL KNEE ARTHROPLASTY (Right Knee)     Patient location during evaluation: PACU Anesthesia Type: Regional and Spinal Level of consciousness: oriented and awake and alert Pain management: pain level controlled Vital Signs Assessment: post-procedure vital signs reviewed and stable Respiratory status: spontaneous breathing and respiratory function stable Cardiovascular status: blood pressure returned to baseline and stable Postop Assessment: no headache, no backache and no apparent nausea or vomiting Anesthetic complications: no    Last Vitals:  Vitals:   11/02/17 0133 11/02/17 0432  BP: (!) 145/74 (!) 169/82  Pulse: 80 76  Resp: 18 18  Temp: 36.9 C 36.7 C  SpO2: 95% 95%    Last Pain:  Vitals:   11/02/17 0700  TempSrc:   PainSc: 10-Worst pain ever                 Lowella CurbWarren Ray Keian Odriscoll

## 2017-11-03 MED ORDER — METHOCARBAMOL 500 MG PO TABS: ORAL_TABLET | ORAL | 1 refills | Status: DC

## 2017-11-03 MED ORDER — ASPIRIN 325 MG PO TBEC: 325.0000 mg | DELAYED_RELEASE_TABLET | Freq: Two times a day (BID) | ORAL | 0 refills | Status: DC

## 2017-11-03 MED ORDER — OXYCODONE HCL 5 MG PO TABS: 5.0000 mg | ORAL_TABLET | ORAL | 0 refills | Status: DC | PRN

## 2017-11-03 NOTE — Discharge Instructions (Signed)

## 2017-11-03 NOTE — Progress Notes (Signed)
OT Treatment Note  Pt making gradual progress toward OT goals but limited by pain. Educated pt on walk in shower transfer but he was unable to perform. Pt required min guard assist for functional mobility to bathroom. Upon return from bathroom; pt with episode of dizziness/lightheadedness, pt returned to seated position with legs elevated and back reclined, VSS. D/c plan remains appropriate. Will continue to follow acutely.   11/03/17 1546  OT Visit Information  Last OT Received On 11/03/17  Assistance Needed +1  History of Present Illness 57 y.o. male s/p R TKA. PMH includes: HTN, Spinal Stenosis, Anxiety, Chronic Low Back Pain  Precautions  Precautions Fall;Knee  Precaution Booklet Issued No  Pain Assessment  Pain Assessment 0-10  Pain Score 8  Pain Location R knee  Pain Descriptors / Indicators Aching;Sore  Pain Intervention(s) Monitored during session;Limited activity within patient's tolerance;Repositioned;RN gave pain meds during session;Ice applied  Cognition  Arousal/Alertness Awake/alert  Behavior During Therapy WFL for tasks assessed/performed  Overall Cognitive Status Within Functional Limits for tasks assessed  ADL  Overall ADL's  Needs assistance/impaired  Lower Body Dressing Maximal assistance;Sit to/from stand  Lower Body Dressing Details (indicate cue type and reason) Educated pt and wife on compensatory strategies for LB ADL  Toilet Transfer Min guard;Ambulation;BSC;RW  Toileting- Horticulturist, commercial;Sit to/from Investment banker, operational Details (indicate cue type and reason) Educated pt on walk in shower trasnfer and use of 3 in 1 in shower as a seat. Pt unable to complete simulated transfer today due to pain  Functional mobility during ADLs Min guard;Rolling walker  General ADL Comments Following transfer from bathroom pt reporting dizziness and lightheadedness; pt retured to seated position with chair reclined and legs elevated; VSS    Bed Mobility  Overal bed mobility Needs Assistance  Bed Mobility Supine to Sit  Supine to sit Mod assist  General bed mobility comments Assist for RLE to EOB and HHA for trunk elevation to sititng. Assist to scoot hips out to EOB with use of bed pad  Balance  Overall balance assessment Needs assistance  Sitting-balance support Feet unsupported;No upper extremity supported  Sitting balance-Leahy Scale Good  Standing balance support Bilateral upper extremity supported  Standing balance-Leahy Scale Poor  Restrictions  Weight Bearing Restrictions Yes  RLE Weight Bearing WBAT  Transfers  Overall transfer level Needs assistance  Equipment used Rolling walker (2 wheeled)  Transfers Sit to/from Stand  Sit to Stand Min assist  General transfer comment to boost up from EOB. Cues for hand placement and technique  OT - End of Session  Equipment Utilized During Treatment Rolling walker  Activity Tolerance Patient tolerated treatment well;Patient limited by pain  Patient left in chair;with call bell/phone within reach;with family/visitor present  OT Assessment/Plan  OT Plan Discharge plan remains appropriate  OT Visit Diagnosis Other abnormalities of gait and mobility (R26.89);Pain  Pain - Right/Left Right  Pain - part of body Knee  OT Frequency (ACUTE ONLY) Min 2X/week  Follow Up Recommendations No OT follow up;Supervision/Assistance - 24 hour  OT Equipment 3 in 1 bedside commode  AM-PAC OT "6 Clicks" Daily Activity Outcome Measure  Help from another person eating meals? 3  Help from another person taking care of personal grooming? 3  Help from another person toileting, which includes using toliet, bedpan, or urinal? 3  Help from another person bathing (including washing, rinsing, drying)? 2  Help from another person to put on and taking off regular upper body clothing? 3  Help from another person to put on and taking off regular lower body clothing? 2  6 Click Score 16  ADL G Code  Conversion CK  OT Goal Progression  Progress towards OT goals Progressing toward goals  Acute Rehab OT Goals  Patient Stated Goal less pain  OT Goal Formulation With patient  OT Time Calculation  OT Start Time (ACUTE ONLY) 1437  OT Stop Time (ACUTE ONLY) 1509  OT Time Calculation (min) 32 min  OT General Charges  $OT Visit 1 Visit  OT Treatments  $Self Care/Home Management  23-37 mins   Stephen Scott, M.S., OTR/L Pager: [phone]

## 2017-11-03 NOTE — Progress Notes (Signed)
Physical Therapy Treatment Patient Details Name: Stephen Scott MRN: [id number] DOB: 05/28/1961 Today's Date: 11/03/2017    History of Present Illness 57 y.o. male s/p R TKA. PMH includes: HTN, Spinal Stenosis, Anxiety, Chronic Low Back Pain    PT Comments    Patient progressing with therapy today. Pain better controled and able to participate more from prior session. Increased ambulation distance and level of independence (now supervision) with transfers and walking. Still ambulating with poor mechanics but reviewed improvements and patient and family understand what to work towards in the coming days. Plan to attempt stair training this afternoon. Pt and wife agreeable to plan.     Follow Up Recommendations  Home health PT;Supervision for mobility/OOB;DC plan and follow up therapy as arranged by surgeon     Equipment Recommendations  3in1 (PT)    Recommendations for Other Services       Precautions / Restrictions Precautions Precautions: Fall;Knee Precaution Booklet Issued: Yes (comment) Precaution Comments: Reviewed knee precautions related to ADL and no pillow under knee.  Restrictions Weight Bearing Restrictions: Yes RLE Weight Bearing: Weight bearing as tolerated    Mobility  Bed Mobility Overal bed mobility: Needs Assistance Bed Mobility: Supine to Sit     Supine to sit: Supervision     General bed mobility comments: supervision and cues for LLE and blanket to assist RLE over EOB and back into bed  Transfers Overall transfer level: Needs assistance Equipment used: Rolling walker (2 wheeled) Transfers: Sit to/from Stand Sit to Stand: Supervision         General transfer comment: now supervision, cues for safety with RW  Ambulation/Gait Ambulation/Gait assistance: Min guard;Supervision Ambulation Distance (Feet): 200 Feet Assistive device: Rolling walker (2 wheeled) Gait Pattern/deviations: Step-to pattern;Antalgic Gait velocity: decreased   General  Gait Details: very slow but increased distance, pt walking on toes on R foot despite cues, able to intermittent flat foot no heel strike. short step length on left, overall good stability progresed to sueprvision this session. rest break x1 after 75 feet.    Stairs            Wheelchair Mobility    Modified Rankin (Stroke Patients Only)       Balance Overall balance assessment: Needs assistance Sitting-balance support: Feet unsupported;No upper extremity supported Sitting balance-Leahy Scale: Good     Standing balance support: During functional activity;Bilateral upper extremity supported Standing balance-Leahy Scale: Poor Standing balance comment: Reliant on B UE support throughout OT eval.                             Cognition Arousal/Alertness: Awake/alert Behavior During Therapy: WFL for tasks assessed/performed Overall Cognitive Status: Within Functional Limits for tasks assessed                                        Exercises      General Comments General comments (skin integrity, edema, etc.): Wife present and invovled durnig session      Pertinent Vitals/Pain Pain Assessment: Faces Faces Pain Scale: Hurts even more Pain Location: R knee Pain Descriptors / Indicators: Discomfort;Grimacing;Aching Pain Intervention(s): Limited activity within patient's tolerance;Monitored during session;Premedicated before session;Repositioned    Home Living                      Prior Function  PT Goals (current goals can now be found in the care plan section) Acute Rehab PT Goals Patient Stated Goal: less pain PT Goal Formulation: With patient/family Time For Goal Achievement: 11/09/17 Potential to Achieve Goals: Good Progress towards PT goals: Progressing toward goals    Frequency    7X/week      PT Plan Current plan remains appropriate    Co-evaluation              AM-PAC PT "6 Clicks" Daily  Activity  Outcome Measure  Difficulty turning over in bed (including adjusting bedclothes, sheets and blankets)?: A Little Difficulty moving from lying on back to sitting on the side of the bed? : A Little Difficulty sitting down on and standing up from a chair with arms (e.g., wheelchair, bedside commode, etc,.)?: A Little Help needed moving to and from a bed to chair (including a wheelchair)?: A Little Help needed walking in hospital room?: A Little Help needed climbing 3-5 steps with a railing? : A Lot 6 Click Score: 17    End of Session Equipment Utilized During Treatment: Gait belt Activity Tolerance: Patient limited by pain Patient left: in bed;with call bell/phone within reach;with family/visitor present Nurse Communication: Mobility status PT Visit Diagnosis: Unsteadiness on feet (R26.81);Other abnormalities of gait and mobility (R26.89);Pain;Difficulty in walking, not elsewhere classified (R26.2) Pain - Right/Left: Right Pain - part of body: Knee     Time: [id number] PT Time Calculation (min) (ACUTE ONLY): 42 min  Charges:  $Gait Training: 23-37 mins                    G Codes:       Etta GrandchildSean Kyen Taite, PT, DPT Acute Rehab Services Pager: [phone]     Etta GrandchildSean  Willisha Sligar 11/03/2017, 11:38 AM

## 2017-11-03 NOTE — Progress Notes (Signed)
Physical Therapy Treatment Patient Details Name: Stephen ShipJohn Scott MRN: [id number] DOB: 30-Aug-1961 Today's Date: 11/03/2017    History of Present Illness 57 y.o. male s/p R TKA. PMH includes: HTN, Spinal Stenosis, Anxiety, Chronic Low Back Pain    PT Comments    Session focused extensively on stair training and discussion with family regarding safe entry home this evening. After much conversation, pt and wife agree to stay and continue to work with PT tomorrow on reinforcing stairs and bolstering patients confidence and independence. Patient currently min A level with LOB x1 on stairs tonight and will benefit from further practice. Pt is supervision level with all other forms of mobility at this time and will likely be ready for d/c tomorrow once medically cleared.     Follow Up Recommendations  Home health PT;Supervision for mobility/OOB;DC plan and follow up therapy as arranged by surgeon     Equipment Recommendations  3in1 (PT)    Recommendations for Other Services       Precautions / Restrictions Precautions Precautions: Fall;Knee Precaution Booklet Issued: No Restrictions Weight Bearing Restrictions: Yes RLE Weight Bearing: Weight bearing as tolerated    Mobility  Bed Mobility Overal bed mobility: Needs Assistance Bed Mobility: Supine to Sit     Supine to sit: Supervision     General bed mobility comments: Supervision this session as patient lifting surgical limb with LLE over EOB  Transfers Overall transfer level: Needs assistance Equipment used: Rolling walker (2 wheeled) Transfers: Sit to/from Stand Sit to Stand: Supervision         General transfer comment: sit to stand and stand pivot transfers x4 to bed to chair. patient able to perform with supervision and good techinque.   Ambulation/Gait Ambulation/Gait assistance: Min guard;Supervision Ambulation Distance (Feet): 50 Feet Assistive device: Rolling walker (2 wheeled) Gait Pattern/deviations: Step-to  pattern;Antalgic     General Gait Details: patient ambulating better with KI with flat foot gait and step length increased in LLE.   Stairs Stairs: Yes   Stair Management: No rails;With walker;Backwards Number of Stairs: 14 General stair comments: extensive time spent with stair training this session patient min A ambulating stairs backwards with roling walker. educated wife on guarding and stabilizing RW, as well as what assistance will be needed for stairs if they were go home this evening. patient with one LOB on stairs requiring mod A to correct. also demo scooting on bottom with patient and wife, patient prefers to use rolling walker backwards.   Wheelchair Mobility    Modified Rankin (Stroke Patients Only)       Balance Overall balance assessment: Needs assistance Sitting-balance support: Feet unsupported;No upper extremity supported Sitting balance-Leahy Scale: Good     Standing balance support: Bilateral upper extremity supported Standing balance-Leahy Scale: Poor                              Cognition Arousal/Alertness: Awake/alert Behavior During Therapy: WFL for tasks assessed/performed Overall Cognitive Status: Within Functional Limits for tasks assessed                                        Exercises      General Comments General comments (skin integrity, edema, etc.): Extensive discussion regarding safety considerations and discharge tonight vs tomorrow with patient and wife. All agreeable tomorrow is better plan at this point given patients  progress.       Pertinent Vitals/Pain Pain Assessment: 0-10 Pain Score: 8  Faces Pain Scale: Hurts whole lot Pain Location: R knee Pain Descriptors / Indicators: Aching;Sore Pain Intervention(s): Limited activity within patient's tolerance;Monitored during session;Premedicated before session    Home Living                      Prior Function            PT Goals (current  goals can now be found in the care plan section) Acute Rehab PT Goals Patient Stated Goal: less pain PT Goal Formulation: With patient/family Time For Goal Achievement: 11/09/17 Potential to Achieve Goals: Good Progress towards PT goals: Progressing toward goals    Frequency    7X/week      PT Plan Current plan remains appropriate    Co-evaluation              AM-PAC PT "6 Clicks" Daily Activity  Outcome Measure  Difficulty turning over in bed (including adjusting bedclothes, sheets and blankets)?: A Little Difficulty moving from lying on back to sitting on the side of the bed? : A Little Difficulty sitting down on and standing up from a chair with arms (e.g., wheelchair, bedside commode, etc,.)?: A Little Help needed moving to and from a bed to chair (including a wheelchair)?: A Little Help needed walking in hospital room?: A Little Help needed climbing 3-5 steps with a railing? : A Little 6 Click Score: 18    End of Session Equipment Utilized During Treatment: Gait belt Activity Tolerance: Patient limited by pain Patient left: in bed;with call bell/phone within reach;with family/visitor present Nurse Communication: Mobility status PT Visit Diagnosis: Unsteadiness on feet (R26.81);Other abnormalities of gait and mobility (R26.89);Pain;Difficulty in walking, not elsewhere classified (R26.2) Pain - Right/Left: Right Pain - part of body: Knee     Time: 1651-1810 PT Time Calculation (min) (ACUTE ONLY): 79 min  Charges:  $Gait Training: 23-37 mins $Therapeutic Activity: 8-22 mins $Self Care/Home Management: 8-22                    G Codes:       Etta Grandchild, PT, DPT Acute Rehab Services Pager: [phone]     Etta Grandchild 11/03/2017, 6:30 PM

## 2017-11-03 NOTE — Progress Notes (Signed)
Subjective: 2 Days Post-Op Procedure(s) (LRB): RIGHT TOTAL KNEE ARTHROPLASTY (Right) Patient reports pain as moderate.  Slow progress with PT due to pain but states today knee pain is slightly improved.   Objective: Vital signs in last 24 hours: Temp:  [98.4 F (36.9 C)-98.8 F (37.1 C)] 98.4 F (36.9 C) (02/07 0502) Pulse Rate:  [92-99] 96 (02/07 0502) Resp:  [18-20] 20 (02/07 0502) BP: (145-178)/(67-77) 156/74 (02/07 0502) SpO2:  [92 %-98 %] 92 % (02/07 0502)  Intake/Output from previous day: 02/06 0701 - 02/07 0700 In: 2070 [P.O.:440; I.V.:1575; IV Piggyback:55] Out: 1775 [Urine:1775] Intake/Output this shift: No intake/output data recorded.  Recent Labs    11/02/17 0602  HGB 13.5   Recent Labs    11/02/17 0602  WBC 12.4*  RBC 4.61  HCT 41.1  PLT 194   Recent Labs    11/02/17 0602  NA 136  K 4.1  CL 104  CO2 19*  BUN 16  CREATININE 0.91  GLUCOSE 158*  CALCIUM 8.3*   No results for input(s): LABPT, INR in the last 72 hours.  Right lower extremity: Sensation intact distally Intact pulses distally Dorsiflexion/Plantar flexion intact Incision: dressing C/D/I Compartment soft  Assessment/Plan: 2 Days Post-Op Procedure(s) (LRB): RIGHT TOTAL KNEE ARTHROPLASTY (Right) Up with therapy  Most likely will discharge to home tomorrow.   Libertie Hausler 11/03/2017, 11:58 AM

## 2017-11-04 NOTE — Progress Notes (Signed)
Physical Therapy Treatment and Discharge Patient Details Name: Stephen Scott MRN: [id number] DOB: Sep 23, 1961 Today's Date: 11/04/2017    History of Present Illness 57 y.o. male s/p R TKA. PMH includes: HTN, Spinal Stenosis, Anxiety, Chronic Low Back Pain    PT Comments    Session focused on improving independence and confidence on stairs and with transfers. Pt and family feel more confident today than prior visit, and report they feel ready to return home safely today. Patient requires min guarding on stairs and they will have physical assistance +2 this afternoon returning home. Pt and wife educated on safety considerations for home and have no further questions or concerns at this time. Pt has met all functional goals and will benefit from skilled home health PT when medically cleared for d/c.     Follow Up Recommendations  Home health PT;Supervision for mobility/OOB;DC plan and follow up therapy as arranged by surgeon     Equipment Recommendations  3in1 (PT)    Recommendations for Other Services       Precautions / Restrictions Precautions Precautions: Fall;Knee Precaution Booklet Issued: No Precaution Comments: Reviewed knee precautions related to ADL and no pillow under knee.  Required Braces or Orthoses: Knee Immobilizer - Right Restrictions Weight Bearing Restrictions: Yes RLE Weight Bearing: Weight bearing as tolerated    Mobility  Bed Mobility Overal bed mobility: Needs Assistance;Modified Independent Bed Mobility: Supine to Sit     Supine to sit: Modified independent (Device/Increase time)        Transfers Overall transfer level: Needs assistance Equipment used: Rolling walker (2 wheeled) Transfers: Sit to/from Stand Sit to Stand: Supervision         General transfer comment: session focused on bolstering independence and educating wife on guarding. wife provides stand by assist with transfers welll at thsi time.  Ambulation/Gait                  Stairs Stairs: Yes   Stair Management: No rails;With walker;Backwards Number of Stairs: 12 General stair comments: proper sequencing with RW. Extensive time with stair training. emphasis on wife and patient growing confidence managing without therapists intervention. Patient and wife are comfortable and able to perform with min guard of patient. No LOB this session, patient with improved strength and tolerence to stairs from prior visit. pt "i am ready". to go home today. also report they will have additional set of hands to assist as well, discussed safe guarding and use of belt for home entry.   Wheelchair Mobility    Modified Rankin (Stroke Patients Only)       Balance Overall balance assessment: Needs assistance Sitting-balance support: Feet unsupported;No upper extremity supported Sitting balance-Leahy Scale: Good     Standing balance support: Bilateral upper extremity supported Standing balance-Leahy Scale: Poor                              Cognition Arousal/Alertness: Awake/alert Behavior During Therapy: WFL for tasks assessed/performed Overall Cognitive Status: Within Functional Limits for tasks assessed                                        Exercises      General Comments        Pertinent Vitals/Pain Pain Assessment: Faces Faces Pain Scale: Hurts whole lot Pain Location: R knee Pain Descriptors / Indicators: Aching;Sore Pain  Intervention(s): Limited activity within patient's tolerance;Monitored during session;Repositioned;Premedicated before session    Home Living                      Prior Function            PT Goals (current goals can now be found in the care plan section) Acute Rehab PT Goals Patient Stated Goal: less pain PT Goal Formulation: With patient/family Time For Goal Achievement: 11/09/17 Potential to Achieve Goals: Good Progress towards PT goals: Goals met/education completed, patient  discharged from PT    Frequency    7X/week      PT Plan Current plan remains appropriate    Co-evaluation              AM-PAC PT "6 Clicks" Daily Activity  Outcome Measure  Difficulty turning over in bed (including adjusting bedclothes, sheets and blankets)?: A Little Difficulty moving from lying on back to sitting on the side of the bed? : A Little Difficulty sitting down on and standing up from a chair with arms (e.g., wheelchair, bedside commode, etc,.)?: A Little Help needed moving to and from a bed to chair (including a wheelchair)?: A Little Help needed walking in hospital room?: A Little Help needed climbing 3-5 steps with a railing? : A Little 6 Click Score: 18    End of Session Equipment Utilized During Treatment: Gait belt Activity Tolerance: Patient limited by pain Patient left: with call bell/phone within reach;in chair;with family/visitor present Nurse Communication: Mobility status PT Visit Diagnosis: Unsteadiness on feet (R26.81);Other abnormalities of gait and mobility (R26.89);Pain;Difficulty in walking, not elsewhere classified (R26.2) Pain - Right/Left: Right Pain - part of body: Knee     Time: 1125-1210 PT Time Calculation (min) (ACUTE ONLY): 45 min  Charges:  $Gait Training: 23-37 mins $Therapeutic Activity: 8-22 mins                    G Codes:       Reinaldo Berber, PT, DPT Acute Rehab Services Pager: [phone]     Reinaldo Berber 11/04/2017, 12:36 PM

## 2017-11-04 NOTE — Discharge Summary (Signed)
Patient ID: Stephen Scott Hilburn MRN: [id number] DOB/AGE: Feb 20, 1961 57 y.o.  Admit date: 11/01/2017 Discharge date: 11/04/2017  Admission Diagnoses:  Principal Problem:   Unilateral primary osteoarthritis, right knee Active Problems:   Status post total right knee replacement   Discharge Diagnoses:  Same  Past Medical History:  Diagnosis Date  . Anemia    as a small child  . Anxiety   . Arthritis    "lower spine; knees" (11/01/2017)  . Chronic lower back pain   . Depression   . Family history of adverse reaction to anesthesia    "daughter PONV"  . GERD (gastroesophageal reflux disease)   . High cholesterol   . History of kidney stones   . Hypertension   . Pneumonia 06/2017; 08/2017   walking pneumonia; treated  . Spinal stenosis    "lower back" (11/01/2017)    Surgeries: Procedure(s): RIGHT TOTAL KNEE ARTHROPLASTY on 11/01/2017   Consultants:   Discharged Condition: Improved  Hospital Course: Stephen [employer] is an 57 y.o. male who was admitted 11/01/2017 for operative treatment ofUnilateral primary osteoarthritis, right knee. Patient has severe unremitting pain that affects sleep, daily activities, and work/hobbies. After pre-op clearance the patient was taken to the operating room on 11/01/2017 and underwent  Procedure(s): RIGHT TOTAL KNEE ARTHROPLASTY.    Patient was given perioperative antibiotics:  Anti-infectives (From admission, onward)   Start     Dose/Rate Route Frequency Ordered Stop   11/01/17 1800  ceFAZolin (ANCEF) IVPB 1 g/50 mL premix     1 g 100 mL/hr over 30 Minutes Intravenous Every 6 hours 11/01/17 1709 11/02/17 0041   11/01/17 1030  ceFAZolin (ANCEF) 3 g in dextrose 5 % 50 mL IVPB     3 g 130 mL/hr over 30 Minutes Intravenous To ShortStay Surgical 10/31/17 1208 11/01/17 1221       Patient was given sequential compression devices, early ambulation, and chemoprophylaxis to prevent DVT.  Patient benefited maximally from hospital stay and there were no  complications.    Recent vital signs:  Patient Vitals for the past 24 hrs:  BP Temp Temp src Pulse Resp SpO2  11/04/17 0511 (!) 157/72 98.7 F (37.1 C) Oral 97 17 99 %  11/03/17 2144 (!) 154/65 98.4 F (36.9 C) Oral 94 19 100 %  11/03/17 1801 (!) 146/75 98.7 F (37.1 C) Oral (!) 104 19 97 %  11/03/17 1509 (!) 143/75 98.2 F (36.8 C) Oral (!) 104 19 96 %     Recent laboratory studies:  Recent Labs    11/02/17 0602  WBC 12.4*  HGB 13.5  HCT 41.1  PLT 194  NA 136  K 4.1  CL 104  CO2 19*  BUN 16  CREATININE 0.91  GLUCOSE 158*  CALCIUM 8.3*     Discharge Medications:   Allergies as of 11/04/2017      Reactions   Iodides Anaphylaxis, Hives, Other (See Comments)   Immediate reaction, looks like chicken pox   Tetanus Toxoids Other (See Comments)   Big red, fever in arm > Local reaction      Medication List    STOP taking these medications   meloxicam 15 MG tablet Commonly known as:  MOBIC   traMADol 50 MG tablet Commonly known as:  ULTRAM     TAKE these medications   aspirin 325 MG EC tablet Take 1 tablet (325 mg total) by mouth 2 (two) times daily after a meal.   buPROPion 150 MG 12 hr tablet Commonly known as:  WELLBUTRIN SR Take 150 mg by mouth 2 (two) times daily.   lisinopril-hydrochlorothiazide 20-12.5 MG tablet Commonly known as:  PRINZIDE,ZESTORETIC Take 1 tablet by mouth once daily   methocarbamol 500 MG tablet Commonly known as:  ROBAXIN TAKE 1 TABLET BY MOUTH EVERY 6-8 HOURS AS NEEDED What changed:  See the new instructions.   oxyCODONE 5 MG immediate release tablet Commonly known as:  ROXICODONE Take 1-2 tablets (5-10 mg total) by mouth every 4 (four) hours as needed.   oxymetazoline 0.05 % nasal spray Commonly known as:  AFRIN Place 2 sprays into both nostrils daily as needed for congestion.   pravastatin 20 MG tablet Commonly known as:  PRAVACHOL Take 20 mg by mouth daily.            Durable Medical Equipment  (From  admission, onward)        Start     Ordered   11/01/17 1710  DME Walker rolling  Once    Question:  Patient needs a walker to treat with the following condition  Answer:  Status post total right knee replacement   11/01/17 1709   11/01/17 1710  DME 3 n 1  Once     11/01/17 1709      Diagnostic Studies: Dg Knee Right Port  Result Date: 11/01/2017 CLINICAL DATA:  Status post right knee replacement today. EXAM: PORTABLE RIGHT KNEE - 1-2 VIEW COMPARISON:  None. FINDINGS: Right total knee arthroplasty is in place. There is some gas in the soft tissues from surgery. No fracture. Hardware is normal in appearance. IMPRESSION: Status post right total knee replacement.  No acute finding. Electronically Signed   By: Drusilla Kanner M.D.   On: 11/01/2017 15:11    Disposition:  To home   Follow-up Information    [facility].   Specialty:  Home Health Services Why:  Home Health Contact information: [address] [place] [address] [phone]        Kathryne Hitch, MD. Schedule an appointment as soon as possible for a visit in 2 day(s).   Specialty:  Orthopedic Surgery Contact information: [address] [place] [address] [phone]            Signed: Kathryne Hitch 11/04/2017, 6:56 AM

## 2017-11-04 NOTE — Progress Notes (Signed)
Patient ID: Stephen Scott, male   DOB: 05/15/1961, 57 y.o.   MRN: [id number] Doing well overall.  Right knee stable.  Can be discharged to home today.

## 2017-11-04 NOTE — Progress Notes (Signed)
Patient was given discharge instructions. Both patient and wife verbalized understanding of medication regimen and follow up appointments. Patient left unit in stable condition with nursing staff via wheelchair.

## 2017-11-05 DIAGNOSIS — Z471 Aftercare following joint replacement surgery: Secondary | ICD-10-CM | POA: Diagnosis not present

## 2017-11-05 DIAGNOSIS — K219 Gastro-esophageal reflux disease without esophagitis: Secondary | ICD-10-CM | POA: Diagnosis not present

## 2017-11-05 DIAGNOSIS — M5441 Lumbago with sciatica, right side: Secondary | ICD-10-CM | POA: Diagnosis not present

## 2017-11-05 DIAGNOSIS — F419 Anxiety disorder, unspecified: Secondary | ICD-10-CM | POA: Diagnosis not present

## 2017-11-05 DIAGNOSIS — F329 Major depressive disorder, single episode, unspecified: Secondary | ICD-10-CM | POA: Diagnosis not present

## 2017-11-05 DIAGNOSIS — Z8701 Personal history of pneumonia (recurrent): Secondary | ICD-10-CM | POA: Diagnosis not present

## 2017-11-05 DIAGNOSIS — M4800 Spinal stenosis, site unspecified: Secondary | ICD-10-CM | POA: Diagnosis not present

## 2017-11-05 DIAGNOSIS — I10 Essential (primary) hypertension: Secondary | ICD-10-CM | POA: Diagnosis not present

## 2017-11-05 DIAGNOSIS — Z79891 Long term (current) use of opiate analgesic: Secondary | ICD-10-CM | POA: Diagnosis not present

## 2017-11-05 DIAGNOSIS — Z7982 Long term (current) use of aspirin: Secondary | ICD-10-CM | POA: Diagnosis not present

## 2017-11-05 DIAGNOSIS — Z9181 History of falling: Secondary | ICD-10-CM | POA: Diagnosis not present

## 2017-11-05 DIAGNOSIS — Z96651 Presence of right artificial knee joint: Secondary | ICD-10-CM | POA: Diagnosis not present

## 2017-11-05 DIAGNOSIS — M5442 Lumbago with sciatica, left side: Secondary | ICD-10-CM | POA: Diagnosis not present

## 2017-11-07 ENCOUNTER — Telehealth (INDEPENDENT_AMBULATORY_CARE_PROVIDER_SITE_OTHER): Payer: Self-pay | Admitting: Orthopaedic Surgery

## 2017-11-07 DIAGNOSIS — Z8701 Personal history of pneumonia (recurrent): Secondary | ICD-10-CM | POA: Diagnosis not present

## 2017-11-07 DIAGNOSIS — I10 Essential (primary) hypertension: Secondary | ICD-10-CM | POA: Diagnosis not present

## 2017-11-07 DIAGNOSIS — Z9181 History of falling: Secondary | ICD-10-CM | POA: Diagnosis not present

## 2017-11-07 DIAGNOSIS — Z7982 Long term (current) use of aspirin: Secondary | ICD-10-CM | POA: Diagnosis not present

## 2017-11-07 DIAGNOSIS — M4800 Spinal stenosis, site unspecified: Secondary | ICD-10-CM | POA: Diagnosis not present

## 2017-11-07 DIAGNOSIS — Z96651 Presence of right artificial knee joint: Secondary | ICD-10-CM | POA: Diagnosis not present

## 2017-11-07 DIAGNOSIS — Z471 Aftercare following joint replacement surgery: Secondary | ICD-10-CM | POA: Diagnosis not present

## 2017-11-07 DIAGNOSIS — F329 Major depressive disorder, single episode, unspecified: Secondary | ICD-10-CM | POA: Diagnosis not present

## 2017-11-07 DIAGNOSIS — M5441 Lumbago with sciatica, right side: Secondary | ICD-10-CM | POA: Diagnosis not present

## 2017-11-07 DIAGNOSIS — K219 Gastro-esophageal reflux disease without esophagitis: Secondary | ICD-10-CM | POA: Diagnosis not present

## 2017-11-07 DIAGNOSIS — Z79891 Long term (current) use of opiate analgesic: Secondary | ICD-10-CM | POA: Diagnosis not present

## 2017-11-07 DIAGNOSIS — M5442 Lumbago with sciatica, left side: Secondary | ICD-10-CM | POA: Diagnosis not present

## 2017-11-07 DIAGNOSIS — F419 Anxiety disorder, unspecified: Secondary | ICD-10-CM | POA: Diagnosis not present

## 2017-11-07 NOTE — Telephone Encounter (Signed)
Verbal order left on VM  

## 2017-11-07 NOTE — Telephone Encounter (Signed)
[facility]  [facility] Verbal Orders  Physical Therapy     One time a week for one week   Twice a week for one week   Three times a week for one week

## 2017-11-08 ENCOUNTER — Telehealth (INDEPENDENT_AMBULATORY_CARE_PROVIDER_SITE_OTHER): Payer: Self-pay

## 2017-11-08 NOTE — Telephone Encounter (Signed)
Patient would like a Rx refill for Oxycodone.  Cb# is [phone].  Please advise.  Thank you.

## 2017-11-09 ENCOUNTER — Other Ambulatory Visit (INDEPENDENT_AMBULATORY_CARE_PROVIDER_SITE_OTHER): Payer: Self-pay | Admitting: Orthopaedic Surgery

## 2017-11-09 DIAGNOSIS — M5441 Lumbago with sciatica, right side: Secondary | ICD-10-CM | POA: Diagnosis not present

## 2017-11-09 DIAGNOSIS — Z7982 Long term (current) use of aspirin: Secondary | ICD-10-CM | POA: Diagnosis not present

## 2017-11-09 DIAGNOSIS — Z9181 History of falling: Secondary | ICD-10-CM | POA: Diagnosis not present

## 2017-11-09 DIAGNOSIS — Z96651 Presence of right artificial knee joint: Secondary | ICD-10-CM | POA: Diagnosis not present

## 2017-11-09 DIAGNOSIS — I10 Essential (primary) hypertension: Secondary | ICD-10-CM | POA: Diagnosis not present

## 2017-11-09 DIAGNOSIS — K219 Gastro-esophageal reflux disease without esophagitis: Secondary | ICD-10-CM | POA: Diagnosis not present

## 2017-11-09 DIAGNOSIS — Z471 Aftercare following joint replacement surgery: Secondary | ICD-10-CM | POA: Diagnosis not present

## 2017-11-09 DIAGNOSIS — F329 Major depressive disorder, single episode, unspecified: Secondary | ICD-10-CM | POA: Diagnosis not present

## 2017-11-09 DIAGNOSIS — Z8701 Personal history of pneumonia (recurrent): Secondary | ICD-10-CM | POA: Diagnosis not present

## 2017-11-09 DIAGNOSIS — Z79891 Long term (current) use of opiate analgesic: Secondary | ICD-10-CM | POA: Diagnosis not present

## 2017-11-09 DIAGNOSIS — M5442 Lumbago with sciatica, left side: Secondary | ICD-10-CM | POA: Diagnosis not present

## 2017-11-09 DIAGNOSIS — F419 Anxiety disorder, unspecified: Secondary | ICD-10-CM | POA: Diagnosis not present

## 2017-11-09 DIAGNOSIS — M4800 Spinal stenosis, site unspecified: Secondary | ICD-10-CM | POA: Diagnosis not present

## 2017-11-09 MED ORDER — OXYCODONE HCL 5 MG PO TABS: 5.0000 mg | ORAL_TABLET | ORAL | 0 refills | Status: DC | PRN

## 2017-11-09 NOTE — Telephone Encounter (Signed)
I sent it in to his pharmacy just now.

## 2017-11-09 NOTE — Telephone Encounter (Signed)
Patient aware.

## 2017-11-09 NOTE — Telephone Encounter (Signed)
Please advise 

## 2017-11-11 DIAGNOSIS — F329 Major depressive disorder, single episode, unspecified: Secondary | ICD-10-CM | POA: Diagnosis not present

## 2017-11-11 DIAGNOSIS — Z9181 History of falling: Secondary | ICD-10-CM | POA: Diagnosis not present

## 2017-11-11 DIAGNOSIS — M4800 Spinal stenosis, site unspecified: Secondary | ICD-10-CM | POA: Diagnosis not present

## 2017-11-11 DIAGNOSIS — M5442 Lumbago with sciatica, left side: Secondary | ICD-10-CM | POA: Diagnosis not present

## 2017-11-11 DIAGNOSIS — M5441 Lumbago with sciatica, right side: Secondary | ICD-10-CM | POA: Diagnosis not present

## 2017-11-11 DIAGNOSIS — Z96651 Presence of right artificial knee joint: Secondary | ICD-10-CM | POA: Diagnosis not present

## 2017-11-11 DIAGNOSIS — Z7982 Long term (current) use of aspirin: Secondary | ICD-10-CM | POA: Diagnosis not present

## 2017-11-11 DIAGNOSIS — Z471 Aftercare following joint replacement surgery: Secondary | ICD-10-CM | POA: Diagnosis not present

## 2017-11-11 DIAGNOSIS — Z8701 Personal history of pneumonia (recurrent): Secondary | ICD-10-CM | POA: Diagnosis not present

## 2017-11-11 DIAGNOSIS — Z79891 Long term (current) use of opiate analgesic: Secondary | ICD-10-CM | POA: Diagnosis not present

## 2017-11-11 DIAGNOSIS — K219 Gastro-esophageal reflux disease without esophagitis: Secondary | ICD-10-CM | POA: Diagnosis not present

## 2017-11-11 DIAGNOSIS — F419 Anxiety disorder, unspecified: Secondary | ICD-10-CM | POA: Diagnosis not present

## 2017-11-11 DIAGNOSIS — I10 Essential (primary) hypertension: Secondary | ICD-10-CM | POA: Diagnosis not present

## 2017-11-14 ENCOUNTER — Ambulatory Visit (INDEPENDENT_AMBULATORY_CARE_PROVIDER_SITE_OTHER): Payer: BLUE CROSS/BLUE SHIELD | Admitting: Physician Assistant

## 2017-11-14 ENCOUNTER — Encounter (INDEPENDENT_AMBULATORY_CARE_PROVIDER_SITE_OTHER): Payer: Self-pay | Admitting: Physician Assistant

## 2017-11-14 DIAGNOSIS — Z96651 Presence of right artificial knee joint: Secondary | ICD-10-CM

## 2017-11-14 MED ORDER — OXYCODONE HCL 5 MG PO TABS: 5.0000 mg | ORAL_TABLET | ORAL | 0 refills | Status: DC | PRN

## 2017-11-14 NOTE — Progress Notes (Signed)
HPI: Mr. Garey HamMcClendon returns today 2 weeks status post right total knee arthroplasty.  Overall trending towards improvement.  Said no fevers chills calf pain or chest pain.  He has had some shortness of breath.  He states his swelling is decreasing.  He is been working with home therapy as a note that states he is -8-92 degrees of flexion.  Physical exam: General well-developed well nourished male in no acute distress ambulating with a cane.  Has near full extension.  Flexion to at least 90 degrees.  Calf supple nontender.  Surgical incisions healing well well approximated with subcu stitch no signs of dehiscence.  Impression: Status post right total knee arthroplasty  Plan: He will work on range of motion strengthening.  We will send him to physical therapy outpatient to work on strengthening range of motion gait balance.  Scar tissue mobilization encouraged.  Refill on his Percocet is given.  He will follow with us in 1 month sooner if there is any questions or concerns.

## 2017-11-15 ENCOUNTER — Inpatient Hospital Stay (INDEPENDENT_AMBULATORY_CARE_PROVIDER_SITE_OTHER): Payer: BLUE CROSS/BLUE SHIELD | Admitting: Orthopaedic Surgery

## 2017-11-15 DIAGNOSIS — M5442 Lumbago with sciatica, left side: Secondary | ICD-10-CM | POA: Diagnosis not present

## 2017-11-15 DIAGNOSIS — M4800 Spinal stenosis, site unspecified: Secondary | ICD-10-CM | POA: Diagnosis not present

## 2017-11-15 DIAGNOSIS — Z96651 Presence of right artificial knee joint: Secondary | ICD-10-CM | POA: Diagnosis not present

## 2017-11-15 DIAGNOSIS — Z79891 Long term (current) use of opiate analgesic: Secondary | ICD-10-CM | POA: Diagnosis not present

## 2017-11-15 DIAGNOSIS — F419 Anxiety disorder, unspecified: Secondary | ICD-10-CM | POA: Diagnosis not present

## 2017-11-15 DIAGNOSIS — Z8701 Personal history of pneumonia (recurrent): Secondary | ICD-10-CM | POA: Diagnosis not present

## 2017-11-15 DIAGNOSIS — M5441 Lumbago with sciatica, right side: Secondary | ICD-10-CM | POA: Diagnosis not present

## 2017-11-15 DIAGNOSIS — F329 Major depressive disorder, single episode, unspecified: Secondary | ICD-10-CM | POA: Diagnosis not present

## 2017-11-15 DIAGNOSIS — Z471 Aftercare following joint replacement surgery: Secondary | ICD-10-CM | POA: Diagnosis not present

## 2017-11-15 DIAGNOSIS — K219 Gastro-esophageal reflux disease without esophagitis: Secondary | ICD-10-CM | POA: Diagnosis not present

## 2017-11-15 DIAGNOSIS — Z7982 Long term (current) use of aspirin: Secondary | ICD-10-CM | POA: Diagnosis not present

## 2017-11-15 DIAGNOSIS — Z9181 History of falling: Secondary | ICD-10-CM | POA: Diagnosis not present

## 2017-11-15 DIAGNOSIS — I10 Essential (primary) hypertension: Secondary | ICD-10-CM | POA: Diagnosis not present

## 2017-11-17 ENCOUNTER — Telehealth (INDEPENDENT_AMBULATORY_CARE_PROVIDER_SITE_OTHER): Payer: Self-pay | Admitting: Radiology

## 2017-11-17 ENCOUNTER — Ambulatory Visit (INDEPENDENT_AMBULATORY_CARE_PROVIDER_SITE_OTHER): Payer: BLUE CROSS/BLUE SHIELD | Admitting: Physician Assistant

## 2017-11-17 DIAGNOSIS — M4800 Spinal stenosis, site unspecified: Secondary | ICD-10-CM | POA: Diagnosis not present

## 2017-11-17 DIAGNOSIS — Z8701 Personal history of pneumonia (recurrent): Secondary | ICD-10-CM | POA: Diagnosis not present

## 2017-11-17 DIAGNOSIS — Z471 Aftercare following joint replacement surgery: Secondary | ICD-10-CM | POA: Diagnosis not present

## 2017-11-17 DIAGNOSIS — Z96651 Presence of right artificial knee joint: Secondary | ICD-10-CM | POA: Diagnosis not present

## 2017-11-17 DIAGNOSIS — Z7982 Long term (current) use of aspirin: Secondary | ICD-10-CM | POA: Diagnosis not present

## 2017-11-17 DIAGNOSIS — M5441 Lumbago with sciatica, right side: Secondary | ICD-10-CM | POA: Diagnosis not present

## 2017-11-17 DIAGNOSIS — I10 Essential (primary) hypertension: Secondary | ICD-10-CM | POA: Diagnosis not present

## 2017-11-17 DIAGNOSIS — K219 Gastro-esophageal reflux disease without esophagitis: Secondary | ICD-10-CM | POA: Diagnosis not present

## 2017-11-17 DIAGNOSIS — F419 Anxiety disorder, unspecified: Secondary | ICD-10-CM | POA: Diagnosis not present

## 2017-11-17 DIAGNOSIS — Z79891 Long term (current) use of opiate analgesic: Secondary | ICD-10-CM | POA: Diagnosis not present

## 2017-11-17 DIAGNOSIS — Z9181 History of falling: Secondary | ICD-10-CM | POA: Diagnosis not present

## 2017-11-17 DIAGNOSIS — F329 Major depressive disorder, single episode, unspecified: Secondary | ICD-10-CM | POA: Diagnosis not present

## 2017-11-17 DIAGNOSIS — M5442 Lumbago with sciatica, left side: Secondary | ICD-10-CM | POA: Diagnosis not present

## 2017-11-17 NOTE — Telephone Encounter (Signed)
See below

## 2017-11-17 NOTE — Telephone Encounter (Signed)
Zella BallRobin called states that pt had a knee replacement surgery with Dr. Magnus IvanBlackman.  She states that he is having pain in that ankle to the point he can't ambulate on it.  I scheduled an appointment but they don't know if he can make it or not.  They would call us back and let us know.  I did advise to them that they will probably want to see them in the office for this so that the incision and everything could be evaled as well.  She asked if they could get a call back if they were not able to get in the office.

## 2017-11-21 ENCOUNTER — Telehealth (INDEPENDENT_AMBULATORY_CARE_PROVIDER_SITE_OTHER): Payer: Self-pay | Admitting: Orthopaedic Surgery

## 2017-11-21 DIAGNOSIS — M1711 Unilateral primary osteoarthritis, right knee: Secondary | ICD-10-CM | POA: Diagnosis not present

## 2017-11-21 MED ORDER — OXYCODONE HCL 5 MG PO TABS: 5.0000 mg | ORAL_TABLET | ORAL | 0 refills | Status: DC | PRN

## 2017-11-21 NOTE — Telephone Encounter (Signed)
Patients wife called on behalf of patient asking for a refill on his oxycodone. CB # [phone]

## 2017-11-21 NOTE — Telephone Encounter (Signed)
Please advise 

## 2017-11-21 NOTE — Telephone Encounter (Signed)
Hopefully I was able to send this into his pharmacy correctly using my phone.

## 2017-11-21 NOTE — Telephone Encounter (Signed)
LMOM for patient Rx sent in

## 2017-11-23 DIAGNOSIS — M1711 Unilateral primary osteoarthritis, right knee: Secondary | ICD-10-CM | POA: Diagnosis not present

## 2017-11-28 ENCOUNTER — Telehealth (INDEPENDENT_AMBULATORY_CARE_PROVIDER_SITE_OTHER): Payer: Self-pay | Admitting: Orthopaedic Surgery

## 2017-11-28 MED ORDER — METHOCARBAMOL 500 MG PO TABS: ORAL_TABLET | ORAL | 1 refills | Status: DC

## 2017-11-28 MED ORDER — OXYCODONE HCL 5 MG PO TABS: 5.0000 mg | ORAL_TABLET | Freq: Four times a day (QID) | ORAL | 0 refills | Status: DC | PRN

## 2017-11-28 NOTE — Telephone Encounter (Signed)
Patient's wife Zella Ball(Robin) called advised patient need Rx refilled (Oxycodone) The  Number to contact Zella BallRobin is [phone]

## 2017-11-28 NOTE — Telephone Encounter (Signed)
I sent them in to his pharmacy.

## 2017-11-28 NOTE — Telephone Encounter (Signed)
Aware. 

## 2017-11-28 NOTE — Telephone Encounter (Signed)
Please advise Last filled one week ago

## 2017-11-29 DIAGNOSIS — M1711 Unilateral primary osteoarthritis, right knee: Secondary | ICD-10-CM | POA: Diagnosis not present

## 2017-12-02 DIAGNOSIS — M1711 Unilateral primary osteoarthritis, right knee: Secondary | ICD-10-CM | POA: Diagnosis not present

## 2017-12-05 ENCOUNTER — Encounter (INDEPENDENT_AMBULATORY_CARE_PROVIDER_SITE_OTHER): Payer: Self-pay | Admitting: Orthopaedic Surgery

## 2017-12-05 ENCOUNTER — Ambulatory Visit (INDEPENDENT_AMBULATORY_CARE_PROVIDER_SITE_OTHER): Payer: BLUE CROSS/BLUE SHIELD | Admitting: Orthopaedic Surgery

## 2017-12-05 DIAGNOSIS — Z96651 Presence of right artificial knee joint: Secondary | ICD-10-CM

## 2017-12-05 MED ORDER — METHYLPREDNISOLONE 4 MG PO TABS: ORAL_TABLET | ORAL | 0 refills | Status: DC

## 2017-12-05 MED ORDER — GABAPENTIN 100 MG PO CAPS: 100.0000 mg | ORAL_CAPSULE | Freq: Three times a day (TID) | ORAL | 1 refills | Status: DC

## 2017-12-05 MED ORDER — IBUPROFEN 800 MG PO TABS: 800.0000 mg | ORAL_TABLET | Freq: Three times a day (TID) | ORAL | 0 refills | Status: DC | PRN

## 2017-12-05 NOTE — Progress Notes (Signed)
The patient will be 5 weeks tomorrow status post a right total knee arthroplasty.  He is actually 1 of my neighbors.  He is having a significant problem with foot pain.  On exam his extension is almost full of his right knee the incision looks great and the swelling is minimal.  His flexion is to about 100 degrees.  His foot is soft and not swollen today.  His calf is soft as well.  He has palpable pulses in his foot as well.  He is very hypersensitive over his foot.  He has not been on any anti-inflammatories.  He is developing a tremor in his hand and some of this is stress related he states.  I would like to put him on a 6-day steroid taper as well as 100 mg gabapentin 3 times a day and 100 mg ibuprofen.  I think this will help alleviate a decent amount of the symptoms.  We will see him back in 2 weeks as he was doing overall.

## 2017-12-08 DIAGNOSIS — M1711 Unilateral primary osteoarthritis, right knee: Secondary | ICD-10-CM | POA: Diagnosis not present

## 2017-12-09 ENCOUNTER — Telehealth (INDEPENDENT_AMBULATORY_CARE_PROVIDER_SITE_OTHER): Payer: Self-pay | Admitting: Orthopaedic Surgery

## 2017-12-09 ENCOUNTER — Other Ambulatory Visit (INDEPENDENT_AMBULATORY_CARE_PROVIDER_SITE_OTHER): Payer: Self-pay | Admitting: Orthopaedic Surgery

## 2017-12-09 MED ORDER — OXYCODONE HCL 5 MG PO TABS: 5.0000 mg | ORAL_TABLET | Freq: Four times a day (QID) | ORAL | 0 refills | Status: DC | PRN

## 2017-12-09 NOTE — Telephone Encounter (Signed)
I escribed some in 

## 2017-12-09 NOTE — Telephone Encounter (Signed)
Please advise 

## 2017-12-09 NOTE — Telephone Encounter (Signed)
Patient's wife called requesting an RX refill on Oxycodone.  She states that he has only 1 pill to last the rest of the weekend.  CB#[phone].  Thank you.

## 2017-12-14 DIAGNOSIS — M1711 Unilateral primary osteoarthritis, right knee: Secondary | ICD-10-CM | POA: Diagnosis not present

## 2017-12-20 ENCOUNTER — Ambulatory Visit (INDEPENDENT_AMBULATORY_CARE_PROVIDER_SITE_OTHER): Payer: BLUE CROSS/BLUE SHIELD | Admitting: Orthopaedic Surgery

## 2017-12-20 ENCOUNTER — Encounter (INDEPENDENT_AMBULATORY_CARE_PROVIDER_SITE_OTHER): Payer: Self-pay | Admitting: Orthopaedic Surgery

## 2017-12-20 DIAGNOSIS — Z96651 Presence of right artificial knee joint: Secondary | ICD-10-CM

## 2017-12-20 MED ORDER — HYDROCODONE-ACETAMINOPHEN 5-325 MG PO TABS: 1.0000 | ORAL_TABLET | Freq: Four times a day (QID) | ORAL | 0 refills | Status: DC | PRN

## 2017-12-20 NOTE — Progress Notes (Signed)
Stephen RuizJohn is now 7 weeks status post a right total knee arthroplasty.  He is ready to transition from oxycodone to hydrocodone.  He is been going to physical therapy and has driven some.  On examination he has almost full extension to at least 100 degrees flexion.  He is making progress.  The knee feels stable.  At this point we will continue aggressive physical therapy for 4 more weeks.  He will still work as much as he can on a home exercise program.  I gave him prescription for hydrocodone today and plan on keeping him on this for several more weeks.  We will see him back in 4 weeks to see how he is doing overall.  No x-rays are needed.

## 2017-12-26 DIAGNOSIS — M1711 Unilateral primary osteoarthritis, right knee: Secondary | ICD-10-CM | POA: Diagnosis not present

## 2017-12-29 DIAGNOSIS — M1711 Unilateral primary osteoarthritis, right knee: Secondary | ICD-10-CM | POA: Diagnosis not present

## 2018-01-04 DIAGNOSIS — M1711 Unilateral primary osteoarthritis, right knee: Secondary | ICD-10-CM | POA: Diagnosis not present

## 2018-01-05 DIAGNOSIS — M1711 Unilateral primary osteoarthritis, right knee: Secondary | ICD-10-CM | POA: Diagnosis not present

## 2018-01-10 ENCOUNTER — Other Ambulatory Visit (INDEPENDENT_AMBULATORY_CARE_PROVIDER_SITE_OTHER): Payer: Self-pay | Admitting: Orthopaedic Surgery

## 2018-01-10 DIAGNOSIS — M1711 Unilateral primary osteoarthritis, right knee: Secondary | ICD-10-CM | POA: Diagnosis not present

## 2018-01-25 ENCOUNTER — Ambulatory Visit (INDEPENDENT_AMBULATORY_CARE_PROVIDER_SITE_OTHER): Payer: BLUE CROSS/BLUE SHIELD | Admitting: Orthopaedic Surgery

## 2018-01-25 ENCOUNTER — Encounter (INDEPENDENT_AMBULATORY_CARE_PROVIDER_SITE_OTHER): Payer: Self-pay | Admitting: Orthopaedic Surgery

## 2018-01-25 DIAGNOSIS — Z96651 Presence of right artificial knee joint: Secondary | ICD-10-CM

## 2018-01-25 MED ORDER — TRAMADOL HCL 50 MG PO TABS: 50.0000 mg | ORAL_TABLET | Freq: Four times a day (QID) | ORAL | 0 refills | Status: DC | PRN

## 2018-01-25 MED ORDER — METHOCARBAMOL 500 MG PO TABS: ORAL_TABLET | ORAL | 1 refills | Status: DC

## 2018-01-25 NOTE — Progress Notes (Signed)
Patient is now almost 3 months status post a right total knee arthroplasty.  He is making excellent progress at this point.  On exam he has full extension to about 110 degrees flexion.  He does still have moderate swelling to the knee feels stable otherwise.  A long thorough discussion about increasing his activities much as he can.  He is already walking without any assistive device and having much less pain.  We will wean him from hydrocodone to tramadol at this point and continue Robaxin as needed as well as other anti-inflammatories.  All questions concerns were answered and addressed.  I would like to see him back in 3 months and I have an AP and lateral of his right knee at that visit.

## 2018-02-23 ENCOUNTER — Other Ambulatory Visit (INDEPENDENT_AMBULATORY_CARE_PROVIDER_SITE_OTHER): Payer: Self-pay | Admitting: Orthopaedic Surgery

## 2018-02-24 NOTE — Telephone Encounter (Signed)
Please advise 

## 2018-03-31 ENCOUNTER — Other Ambulatory Visit (INDEPENDENT_AMBULATORY_CARE_PROVIDER_SITE_OTHER): Payer: Self-pay | Admitting: Orthopaedic Surgery

## 2018-04-24 ENCOUNTER — Other Ambulatory Visit (INDEPENDENT_AMBULATORY_CARE_PROVIDER_SITE_OTHER): Payer: Self-pay | Admitting: Orthopaedic Surgery

## 2018-04-25 NOTE — Telephone Encounter (Signed)
Please advise 

## 2018-04-27 ENCOUNTER — Ambulatory Visit (INDEPENDENT_AMBULATORY_CARE_PROVIDER_SITE_OTHER): Payer: BLUE CROSS/BLUE SHIELD | Admitting: Orthopaedic Surgery

## 2018-05-07 DIAGNOSIS — Z79899 Other long term (current) drug therapy: Secondary | ICD-10-CM | POA: Diagnosis not present

## 2018-05-07 DIAGNOSIS — I10 Essential (primary) hypertension: Secondary | ICD-10-CM | POA: Diagnosis not present

## 2018-05-07 DIAGNOSIS — E7800 Pure hypercholesterolemia, unspecified: Secondary | ICD-10-CM | POA: Diagnosis not present

## 2018-05-07 DIAGNOSIS — R739 Hyperglycemia, unspecified: Secondary | ICD-10-CM | POA: Diagnosis not present

## 2018-05-15 ENCOUNTER — Ambulatory Visit (INDEPENDENT_AMBULATORY_CARE_PROVIDER_SITE_OTHER): Payer: BLUE CROSS/BLUE SHIELD | Admitting: Orthopaedic Surgery

## 2018-05-25 ENCOUNTER — Encounter (INDEPENDENT_AMBULATORY_CARE_PROVIDER_SITE_OTHER): Payer: Self-pay | Admitting: Orthopaedic Surgery

## 2018-05-25 ENCOUNTER — Ambulatory Visit (INDEPENDENT_AMBULATORY_CARE_PROVIDER_SITE_OTHER): Payer: BLUE CROSS/BLUE SHIELD | Admitting: Orthopaedic Surgery

## 2018-05-25 ENCOUNTER — Ambulatory Visit (INDEPENDENT_AMBULATORY_CARE_PROVIDER_SITE_OTHER): Payer: Self-pay

## 2018-05-25 DIAGNOSIS — Z96651 Presence of right artificial knee joint: Secondary | ICD-10-CM | POA: Diagnosis not present

## 2018-05-25 MED ORDER — METHOCARBAMOL 500 MG PO TABS: ORAL_TABLET | ORAL | 1 refills | Status: DC

## 2018-05-25 NOTE — Progress Notes (Signed)
The patient is now 7 months status post a right total knee arthroplasty.  He is also dealing with lumbar stenosis.  He is making progress with his knee.  He does need a refill on his Robaxin.  He still taking gabapentin intermittently as well.  On exam he lacks full extension but just barely few degrees and is flexes past 100 degrees.  His knee is still swollen but not effusion.  I have given reassurance this is the swelling we see he can take up to a year for it to resolve.  He can get back to lifting activities.  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  He is currently careful as he does this.  He still work on Dance movement psychotherapistquad strengthening exercises.  We are going to hold off on further treatment for stenosis until he is much better from his knee.  I would like to see him back in 6 months however repeat AP and lateral of his right knee at that visit.  Also I would like a standing AP and lateral lumbar spine.

## 2018-05-31 DIAGNOSIS — H5213 Myopia, bilateral: Secondary | ICD-10-CM | POA: Diagnosis not present

## 2018-05-31 DIAGNOSIS — H2513 Age-related nuclear cataract, bilateral: Secondary | ICD-10-CM | POA: Diagnosis not present

## 2018-05-31 DIAGNOSIS — H524 Presbyopia: Secondary | ICD-10-CM | POA: Diagnosis not present

## 2018-05-31 DIAGNOSIS — H52203 Unspecified astigmatism, bilateral: Secondary | ICD-10-CM | POA: Diagnosis not present

## 2018-05-31 DIAGNOSIS — H25043 Posterior subcapsular polar age-related cataract, bilateral: Secondary | ICD-10-CM | POA: Diagnosis not present

## 2018-06-05 ENCOUNTER — Telehealth (INDEPENDENT_AMBULATORY_CARE_PROVIDER_SITE_OTHER): Payer: Self-pay | Admitting: Orthopaedic Surgery

## 2018-06-05 NOTE — Telephone Encounter (Signed)
I left voicemail advising. ?

## 2018-06-05 NOTE — Telephone Encounter (Signed)
Patient's wife Zella Ball) called stating patient had knee surgery in February and want to know if it's ok to set a date for the patient to have cataract surgery? The number to contact Zella Ball is [phone]

## 2018-06-05 NOTE — Telephone Encounter (Signed)
Please advise 

## 2018-06-05 NOTE — Telephone Encounter (Signed)
He can have cataract surgery at any point now. No issues from his knee replacement standpoint.

## 2018-06-12 DIAGNOSIS — H25043 Posterior subcapsular polar age-related cataract, bilateral: Secondary | ICD-10-CM | POA: Diagnosis not present

## 2018-06-21 DIAGNOSIS — H2512 Age-related nuclear cataract, left eye: Secondary | ICD-10-CM | POA: Diagnosis not present

## 2018-06-21 DIAGNOSIS — H2511 Age-related nuclear cataract, right eye: Secondary | ICD-10-CM | POA: Diagnosis not present

## 2018-06-21 DIAGNOSIS — H25041 Posterior subcapsular polar age-related cataract, right eye: Secondary | ICD-10-CM | POA: Diagnosis not present

## 2018-06-21 DIAGNOSIS — H25042 Posterior subcapsular polar age-related cataract, left eye: Secondary | ICD-10-CM | POA: Diagnosis not present

## 2018-06-28 DIAGNOSIS — H2511 Age-related nuclear cataract, right eye: Secondary | ICD-10-CM | POA: Diagnosis not present

## 2018-06-28 DIAGNOSIS — H25041 Posterior subcapsular polar age-related cataract, right eye: Secondary | ICD-10-CM | POA: Diagnosis not present

## 2018-08-08 DIAGNOSIS — Z23 Encounter for immunization: Secondary | ICD-10-CM | POA: Diagnosis not present

## 2018-08-08 DIAGNOSIS — Z0000 Encounter for general adult medical examination without abnormal findings: Secondary | ICD-10-CM | POA: Diagnosis not present

## 2018-08-08 DIAGNOSIS — F3340 Major depressive disorder, recurrent, in remission, unspecified: Secondary | ICD-10-CM | POA: Diagnosis not present

## 2018-08-08 DIAGNOSIS — I10 Essential (primary) hypertension: Secondary | ICD-10-CM | POA: Diagnosis not present

## 2018-08-08 DIAGNOSIS — E782 Mixed hyperlipidemia: Secondary | ICD-10-CM | POA: Diagnosis not present

## 2018-08-15 ENCOUNTER — Other Ambulatory Visit (INDEPENDENT_AMBULATORY_CARE_PROVIDER_SITE_OTHER): Payer: Self-pay | Admitting: Orthopaedic Surgery

## 2018-08-15 NOTE — Telephone Encounter (Signed)
Please advise 

## 2018-08-29 DIAGNOSIS — F3340 Major depressive disorder, recurrent, in remission, unspecified: Secondary | ICD-10-CM | POA: Diagnosis not present

## 2018-08-29 DIAGNOSIS — R7301 Impaired fasting glucose: Secondary | ICD-10-CM | POA: Diagnosis not present

## 2018-08-29 DIAGNOSIS — E782 Mixed hyperlipidemia: Secondary | ICD-10-CM | POA: Diagnosis not present

## 2018-08-29 DIAGNOSIS — I10 Essential (primary) hypertension: Secondary | ICD-10-CM | POA: Diagnosis not present

## 2018-09-25 DIAGNOSIS — E782 Mixed hyperlipidemia: Secondary | ICD-10-CM | POA: Diagnosis not present

## 2018-09-25 DIAGNOSIS — R7301 Impaired fasting glucose: Secondary | ICD-10-CM | POA: Diagnosis not present

## 2018-09-25 DIAGNOSIS — I10 Essential (primary) hypertension: Secondary | ICD-10-CM | POA: Diagnosis not present

## 2018-09-25 DIAGNOSIS — F3340 Major depressive disorder, recurrent, in remission, unspecified: Secondary | ICD-10-CM | POA: Diagnosis not present

## 2018-10-31 DIAGNOSIS — L309 Dermatitis, unspecified: Secondary | ICD-10-CM | POA: Diagnosis not present

## 2018-10-31 DIAGNOSIS — Z961 Presence of intraocular lens: Secondary | ICD-10-CM | POA: Diagnosis not present

## 2018-10-31 DIAGNOSIS — D229 Melanocytic nevi, unspecified: Secondary | ICD-10-CM | POA: Diagnosis not present

## 2018-10-31 DIAGNOSIS — L570 Actinic keratosis: Secondary | ICD-10-CM | POA: Diagnosis not present

## 2018-10-31 DIAGNOSIS — L739 Follicular disorder, unspecified: Secondary | ICD-10-CM | POA: Diagnosis not present

## 2018-11-06 ENCOUNTER — Other Ambulatory Visit (INDEPENDENT_AMBULATORY_CARE_PROVIDER_SITE_OTHER): Payer: Self-pay | Admitting: Orthopaedic Surgery

## 2018-11-07 NOTE — Telephone Encounter (Signed)
Please advise 

## 2018-11-21 DIAGNOSIS — E782 Mixed hyperlipidemia: Secondary | ICD-10-CM | POA: Diagnosis not present

## 2018-11-21 DIAGNOSIS — R7301 Impaired fasting glucose: Secondary | ICD-10-CM | POA: Diagnosis not present

## 2018-11-21 DIAGNOSIS — Z79899 Other long term (current) drug therapy: Secondary | ICD-10-CM | POA: Diagnosis not present

## 2018-11-21 DIAGNOSIS — E669 Obesity, unspecified: Secondary | ICD-10-CM | POA: Diagnosis not present

## 2018-11-21 DIAGNOSIS — F3340 Major depressive disorder, recurrent, in remission, unspecified: Secondary | ICD-10-CM | POA: Diagnosis not present

## 2018-11-21 DIAGNOSIS — I10 Essential (primary) hypertension: Secondary | ICD-10-CM | POA: Diagnosis not present

## 2018-11-27 ENCOUNTER — Ambulatory Visit (INDEPENDENT_AMBULATORY_CARE_PROVIDER_SITE_OTHER): Payer: BLUE CROSS/BLUE SHIELD | Admitting: Orthopaedic Surgery

## 2018-11-27 ENCOUNTER — Encounter (INDEPENDENT_AMBULATORY_CARE_PROVIDER_SITE_OTHER): Payer: Self-pay | Admitting: Orthopaedic Surgery

## 2018-11-27 ENCOUNTER — Ambulatory Visit (INDEPENDENT_AMBULATORY_CARE_PROVIDER_SITE_OTHER): Payer: BLUE CROSS/BLUE SHIELD

## 2018-11-27 DIAGNOSIS — Z96651 Presence of right artificial knee joint: Secondary | ICD-10-CM | POA: Diagnosis not present

## 2018-11-27 DIAGNOSIS — G8929 Other chronic pain: Secondary | ICD-10-CM

## 2018-11-27 DIAGNOSIS — M5442 Lumbago with sciatica, left side: Secondary | ICD-10-CM | POA: Diagnosis not present

## 2018-11-27 DIAGNOSIS — M5441 Lumbago with sciatica, right side: Secondary | ICD-10-CM

## 2018-11-27 NOTE — Progress Notes (Signed)
Office Visit Note   Patient: Stephen Scott           Date of Birth: 06-09-1961           MRN: [id number] Visit Date: 11/27/2018              Requested by: Rhetta Mura, PA-C [address] [place], [place] [address] PCP: Rhetta Mura, PA-C   Assessment & Plan: Visit Diagnoses:  1. History of total right knee replacement   2. Chronic bilateral low back pain with bilateral sciatica     Plan: I do think with time as well as continued weight loss and quad strengthening that his knee is been to be fine and looks great overall.  I gave him reassurance that now he is very much ahead of what a lot of people can be.  I do think weight loss is going to help his back but he does have severe stenosis.  If he gets bad enough for him that he would like a referral to a spine surgeon we could certainly do so.  All question concerns were answered and addressed.  Follow-up as otherwise as needed.  Follow-Up Instructions: Return if symptoms worsen or fail to improve.   Orders:  Orders Placed This Encounter  Procedures  . XR Knee 1-2 Views Right   No orders of the defined types were placed in this encounter.     Procedures: No procedures performed   Clinical Data: No additional findings.   Subjective: Chief Complaint  Patient presents with  . Right Knee - Follow-up  Stephen Scott is well-known to me.  He is 1 year out from a right total knee arthroplasty.  He still deals with daily aches and pains but has made great progress overall.  We have also seen him for severe L3-L4 lumbar stenosis.  He still takes an occasional Neurontin and methocarbamol and that does help.  He is worked on weight loss and he says he lost about 30 pounds since December.  He points the medial aspect of his knee over the pes bursa area as the main source of pain but he feels like it stable to him.  Overall he feels like he is made good progress.  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  HPI  Review of Systems He currently denies any headache, chest pain, shortness of breath, fever, chills, nausea, vomiting  Objective: Vital Signs: There were no vitals taken for this visit.  Physical Exam Is alert and orient x3 and in no acute distress Ortho Exam Examination of his right operative knee shows that there is no effusion.  His range of motion is full and the knee feels loosely stable.  He does have appropriate pain of the medial aspect of his knee at the pes bursa area. Specialty Comments:  No specialty comments available.  Imaging: Xr Knee 1-2 Views Right  Result Date: 11/27/2018 2 views of the right knee show total knee arthroplasty with no complicating features.    PMFS History: Patient Active Problem List   Diagnosis Date Noted  . Status post total right knee replacement 11/01/2017  . Chronic pain of right knee 05/24/2017  . Unilateral primary osteoarthritis, right knee 05/24/2017  . Chronic bilateral low back pain with bilateral sciatica 12/08/2016   Past Medical History:  Diagnosis Date  . Anemia    as a small child  . Anxiety   . Arthritis    "  lower spine; knees" (11/01/2017)  . Chronic lower back pain   . Depression   . Family history of adverse reaction to anesthesia    "daughter PONV"  . GERD (gastroesophageal reflux disease)   . High cholesterol   . History of kidney stones   . Hypertension   . Pneumonia 06/2017; 08/2017   walking pneumonia; treated  . Spinal stenosis    "lower back" (11/01/2017)    History reviewed. No pertinent family history.  Past Surgical History:  Procedure Laterality Date  . HAND RECONSTRUCTION Left 1989   "injured when washing dishes"  . JOINT REPLACEMENT    . KNEE ARTHROSCOPY Right 2008  . TOTAL KNEE ARTHROPLASTY Right 11/01/2017  . TOTAL KNEE ARTHROPLASTY Right 11/01/2017   Procedure: RIGHT TOTAL KNEE ARTHROPLASTY;  Surgeon: Kathryne Hitch, MD;  Location: [facility]  OR;  Service: Orthopedics;  Laterality: Right;  . WISDOM TOOTH EXTRACTION  1981   Social History   Occupational History  . Not on file  Tobacco Use  . Smoking status: Never Smoker  . Smokeless tobacco: Never Used  Substance and Sexual Activity  . Alcohol use: Yes    Alcohol/week: 3.0 standard drinks    Types: 3 Glasses of wine per week  . Drug use: No  . Sexual activity: Not on file

## 2018-12-11 DIAGNOSIS — J069 Acute upper respiratory infection, unspecified: Secondary | ICD-10-CM | POA: Diagnosis not present

## 2019-01-02 DIAGNOSIS — F3340 Major depressive disorder, recurrent, in remission, unspecified: Secondary | ICD-10-CM | POA: Diagnosis not present

## 2019-01-02 DIAGNOSIS — I10 Essential (primary) hypertension: Secondary | ICD-10-CM | POA: Diagnosis not present

## 2019-01-02 DIAGNOSIS — E782 Mixed hyperlipidemia: Secondary | ICD-10-CM | POA: Diagnosis not present

## 2019-01-02 DIAGNOSIS — E669 Obesity, unspecified: Secondary | ICD-10-CM | POA: Diagnosis not present

## 2019-01-23 ENCOUNTER — Telehealth (INDEPENDENT_AMBULATORY_CARE_PROVIDER_SITE_OTHER): Payer: Self-pay | Admitting: Orthopaedic Surgery

## 2019-01-23 ENCOUNTER — Other Ambulatory Visit (INDEPENDENT_AMBULATORY_CARE_PROVIDER_SITE_OTHER): Payer: Self-pay | Admitting: Orthopaedic Surgery

## 2019-01-23 NOTE — Telephone Encounter (Signed)
Patients wife Zella Ball requested a call from Dr. Magnus Ivan to discuss a referral.

## 2019-01-24 ENCOUNTER — Other Ambulatory Visit (INDEPENDENT_AMBULATORY_CARE_PROVIDER_SITE_OTHER): Payer: Self-pay | Admitting: Orthopaedic Surgery

## 2019-01-24 NOTE — Telephone Encounter (Signed)
Please advise 

## 2019-01-24 NOTE — Telephone Encounter (Signed)
See below

## 2019-01-25 NOTE — Telephone Encounter (Signed)
Please refer him to Dr. Donalee Citrin, Neurosurgery, to evaluate sever lumbar foraminal stenosis.

## 2019-01-26 ENCOUNTER — Other Ambulatory Visit (INDEPENDENT_AMBULATORY_CARE_PROVIDER_SITE_OTHER): Payer: Self-pay

## 2019-01-26 DIAGNOSIS — M48061 Spinal stenosis, lumbar region without neurogenic claudication: Secondary | ICD-10-CM

## 2019-02-08 DIAGNOSIS — M48062 Spinal stenosis, lumbar region with neurogenic claudication: Secondary | ICD-10-CM | POA: Diagnosis not present

## 2019-02-08 DIAGNOSIS — M5412 Radiculopathy, cervical region: Secondary | ICD-10-CM | POA: Diagnosis not present

## 2019-03-15 ENCOUNTER — Other Ambulatory Visit: Payer: Self-pay | Admitting: Neurosurgery

## 2019-03-15 DIAGNOSIS — M5412 Radiculopathy, cervical region: Secondary | ICD-10-CM

## 2019-03-15 DIAGNOSIS — M48062 Spinal stenosis, lumbar region with neurogenic claudication: Secondary | ICD-10-CM

## 2019-03-27 ENCOUNTER — Other Ambulatory Visit (INDEPENDENT_AMBULATORY_CARE_PROVIDER_SITE_OTHER): Payer: Self-pay | Admitting: Orthopaedic Surgery

## 2019-03-27 NOTE — Telephone Encounter (Signed)
Please advise 

## 2019-04-19 ENCOUNTER — Ambulatory Visit: Payer: Self-pay

## 2019-04-19 ENCOUNTER — Other Ambulatory Visit: Payer: Self-pay

## 2019-04-19 ENCOUNTER — Encounter: Payer: Self-pay | Admitting: Physician Assistant

## 2019-04-19 ENCOUNTER — Ambulatory Visit (INDEPENDENT_AMBULATORY_CARE_PROVIDER_SITE_OTHER): Payer: BC Managed Care – PPO | Admitting: Physician Assistant

## 2019-04-19 DIAGNOSIS — M79671 Pain in right foot: Secondary | ICD-10-CM

## 2019-04-19 MED ORDER — METHYLPREDNISOLONE 4 MG PO TABS: ORAL_TABLET | ORAL | 0 refills | Status: DC

## 2019-04-19 NOTE — Progress Notes (Signed)
Office Visit Note   Patient: Stephen Scott           Date of Birth: 1961/01/06           MRN: [id number] Visit Date: 04/19/2019              Requested by: Rhetta MuraAlthisar, Henry, PA-C [address] [place] [id number] PCP: Rhetta MuraAlthisar, Henry, PA-C   Assessment & Plan: Visit Diagnoses:  1. Pain in right foot     Plan:  Explained to Stephen Scott that his right foot pain is consistent with a gouty flare.  He has family history of gout but no personal history of gout.  He is to see his primary care physician soon and have his yearly labs therefore if they feel that it is necessary they can draw a uric acid level at that time.  Empirically will place him on Medrol Dosepak.  He is on pravastatin there is some increased risk Colcrys and pravastatin therefore will avoid the Colcrys.  We will see him back in a week to see how he is doing.  Questions were encouraged and answered at length.  Discussed with him change in shoe wear out periodically and elevation wiggling toes.  Follow-Up Instructions: Return in about 1 week (around 04/26/2019).   Orders:  Orders Placed This Encounter  Procedures  . XR Foot Complete Right   Meds ordered this encounter  Medications  . methylPREDNISolone (MEDROL) 4 MG tablet    Sig: Take as directed    Dispense:  21 tablet    Refill:  0      Procedures: No procedures performed   Clinical Data: No additional findings.   Subjective: Chief Complaint  Patient presents with  . Right Foot - Pain    HPI Stephen Scott is a 58 year old male well-known to Dr. Magnus IvanBlackman service comes in today for right foot pain that started 2 days ago.  He states that he has a hard time walking first thing in the morning.  He notes swelling throughout the foot.  He has pain even with the sheet touching his foot.  Notes numbness tingling in his toes.  He did twist his foot and is unsure if this is the cause of the swelling and pain. Review of Systems Please see HPI  otherwise negative or noncontributory.  Objective: Vital Signs: There were no vitals taken for this visit.  Physical Exam General: Well-developed well-nourished male in no acute distress Psych: Alert and oriented x3 Ortho Exam Bilateral feet no impending ulcers rashes or skin lesions.  Erythema over the right foot at the medial aspect of the MTP joint.  Edema about the right foot over the dorsal aspect of the right foot.  There is no ecchymosis.  Has tenderness at medial tubercle of the calcaneus of the right foot tight gastroc on the right.  Paresthesias with light touch over the right foot negative on the left.  Specialty Comments:  No specialty comments available.  Imaging: Xr Foot Complete Right  Result Date: 04/19/2019 3 views right foot :No acute fracture. Lisfranc joints without diastasis. Morton's type foot with second and third metatarsals being longer than the first.     PMFS History: Patient Active Problem List   Diagnosis Date Noted  . Status post total right knee replacement 11/01/2017  . Chronic pain of right knee 05/24/2017  . Unilateral primary osteoarthritis, right knee 05/24/2017  . Chronic bilateral low back pain with bilateral sciatica 12/08/2016   Past Medical History:  Diagnosis Date  . Anemia    as a small child  . Anxiety   . Arthritis    "lower spine; knees" (11/01/2017)  . Chronic lower back pain   . Depression   . Family history of adverse reaction to anesthesia    "daughter PONV"  . GERD (gastroesophageal reflux disease)   . High cholesterol   . History of kidney stones   . Hypertension   . Pneumonia 06/2017; 08/2017   walking pneumonia; treated  . Spinal stenosis    "lower back" (11/01/2017)    History reviewed. No pertinent family history.  Past Surgical History:  Procedure Laterality Date  . HAND RECONSTRUCTION Left 1989   "injured when washing dishes"  . JOINT REPLACEMENT    . KNEE ARTHROSCOPY Right 2008  . TOTAL KNEE ARTHROPLASTY  Right 11/01/2017  . TOTAL KNEE ARTHROPLASTY Right 11/01/2017   Procedure: RIGHT TOTAL KNEE ARTHROPLASTY;  Surgeon: Mcarthur Rossetti, MD;  Location: [facility];  Service: Orthopedics;  Laterality: Right;  . [place] EXTRACTION  1981   Social History   Occupational History  . Not on file  Tobacco Use  . Smoking status: Never Smoker  . Smokeless tobacco: Never Used  Substance and Sexual Activity  . Alcohol use: Yes    Alcohol/week: 3.0 standard drinks    Types: 3 Glasses of wine per week  . Drug use: No  . Sexual activity: Not on file

## 2019-04-26 ENCOUNTER — Ambulatory Visit
Admission: RE | Admit: 2019-04-26 | Discharge: 2019-04-26 | Disposition: A | Discharge status: Home / Self Care | Payer: BC Managed Care – PPO | Source: Ambulatory Visit | Attending: Neurosurgery | Admitting: Neurosurgery

## 2019-04-26 ENCOUNTER — Ambulatory Visit (INDEPENDENT_AMBULATORY_CARE_PROVIDER_SITE_OTHER): Payer: BC Managed Care – PPO | Admitting: Physician Assistant

## 2019-04-26 ENCOUNTER — Encounter: Payer: Self-pay | Admitting: Physician Assistant

## 2019-04-26 ENCOUNTER — Other Ambulatory Visit: Payer: Self-pay

## 2019-04-26 DIAGNOSIS — M79671 Pain in right foot: Secondary | ICD-10-CM

## 2019-04-26 DIAGNOSIS — M48062 Spinal stenosis, lumbar region with neurogenic claudication: Secondary | ICD-10-CM | POA: Diagnosis not present

## 2019-04-26 DIAGNOSIS — M4802 Spinal stenosis, cervical region: Secondary | ICD-10-CM | POA: Diagnosis not present

## 2019-04-26 DIAGNOSIS — M5412 Radiculopathy, cervical region: Secondary | ICD-10-CM

## 2019-04-26 NOTE — Progress Notes (Signed)
HPI: Mr. Slabach returns today follow-up of his right foot.  He is doing much better his swelling is decreased.  No longer having hyperparesthesias having foot with sheet touching his foot.  He has finished the Medrol Dosepak. He has new complaint of right elbow pain points to the antecubital region.  He is also still having some discomfort at times with his knee he points the medial aspect of the right knee which she had a total knee performed on 11/01/2017.  He seen Dr. Saintclair Halsted and had a MRI of his spine today.  Physical exam: Right foot he has no hyper paresthesias with palpation and light touch to the foot.  Range of motion of the joint joint causes no discomfort.  He is nontender over the medial aspect of the great toe.  Right elbow full extension full flexion he has full supination pronation of the forearm without pain.  He is tender over the distal biceps tendon there is no evidence of rupture or gap within the tendon. Right knee: Surgical incisions well-healed.  No abnormal warmth erythema.  He has full range of motion of the knee.  Slightly underdeveloped VMO.  Impression: Right foot pain and swelling resolved consistent with gout Right elbow distally biceps tendinitis Status post right knee arthroplasty 10/2017.  Plan: He will talk to his primary care physician about this foot swelling presented might be gout.  In regards to the distal biceps tendinitis and his knee recommend Voltaren gel which she can buy over-the-counter apply 2 g up to 4 times daily to the elbow and 4 g up to 4 times daily to the knee.  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  Follow-up with [place] on as-needed basis.  Questions encouraged and answered

## 2019-05-22 DIAGNOSIS — M542 Cervicalgia: Secondary | ICD-10-CM | POA: Diagnosis not present

## 2019-05-22 DIAGNOSIS — M5126 Other intervertebral disc displacement, lumbar region: Secondary | ICD-10-CM | POA: Diagnosis not present

## 2019-05-22 DIAGNOSIS — M48062 Spinal stenosis, lumbar region with neurogenic claudication: Secondary | ICD-10-CM | POA: Diagnosis not present

## 2019-06-11 DIAGNOSIS — I10 Essential (primary) hypertension: Secondary | ICD-10-CM | POA: Diagnosis not present

## 2019-06-11 DIAGNOSIS — E669 Obesity, unspecified: Secondary | ICD-10-CM | POA: Diagnosis not present

## 2019-06-11 DIAGNOSIS — M109 Gout, unspecified: Secondary | ICD-10-CM | POA: Diagnosis not present

## 2019-08-06 DIAGNOSIS — Z20828 Contact with and (suspected) exposure to other viral communicable diseases: Secondary | ICD-10-CM | POA: Diagnosis not present

## 2019-08-10 DIAGNOSIS — B349 Viral infection, unspecified: Secondary | ICD-10-CM | POA: Diagnosis not present

## 2019-08-10 DIAGNOSIS — I10 Essential (primary) hypertension: Secondary | ICD-10-CM | POA: Diagnosis not present

## 2019-08-10 DIAGNOSIS — U071 COVID-19: Secondary | ICD-10-CM | POA: Diagnosis not present

## 2019-08-17 IMAGING — DX DG KNEE 1-2V PORT*R*
2 series · 2 of 2 positions shown · non-contrast
Comparison: None.

CLINICAL DATA: Status post right knee replacement today.

EXAM:
PORTABLE RIGHT KNEE - 1-2 VIEW

[knee ap]
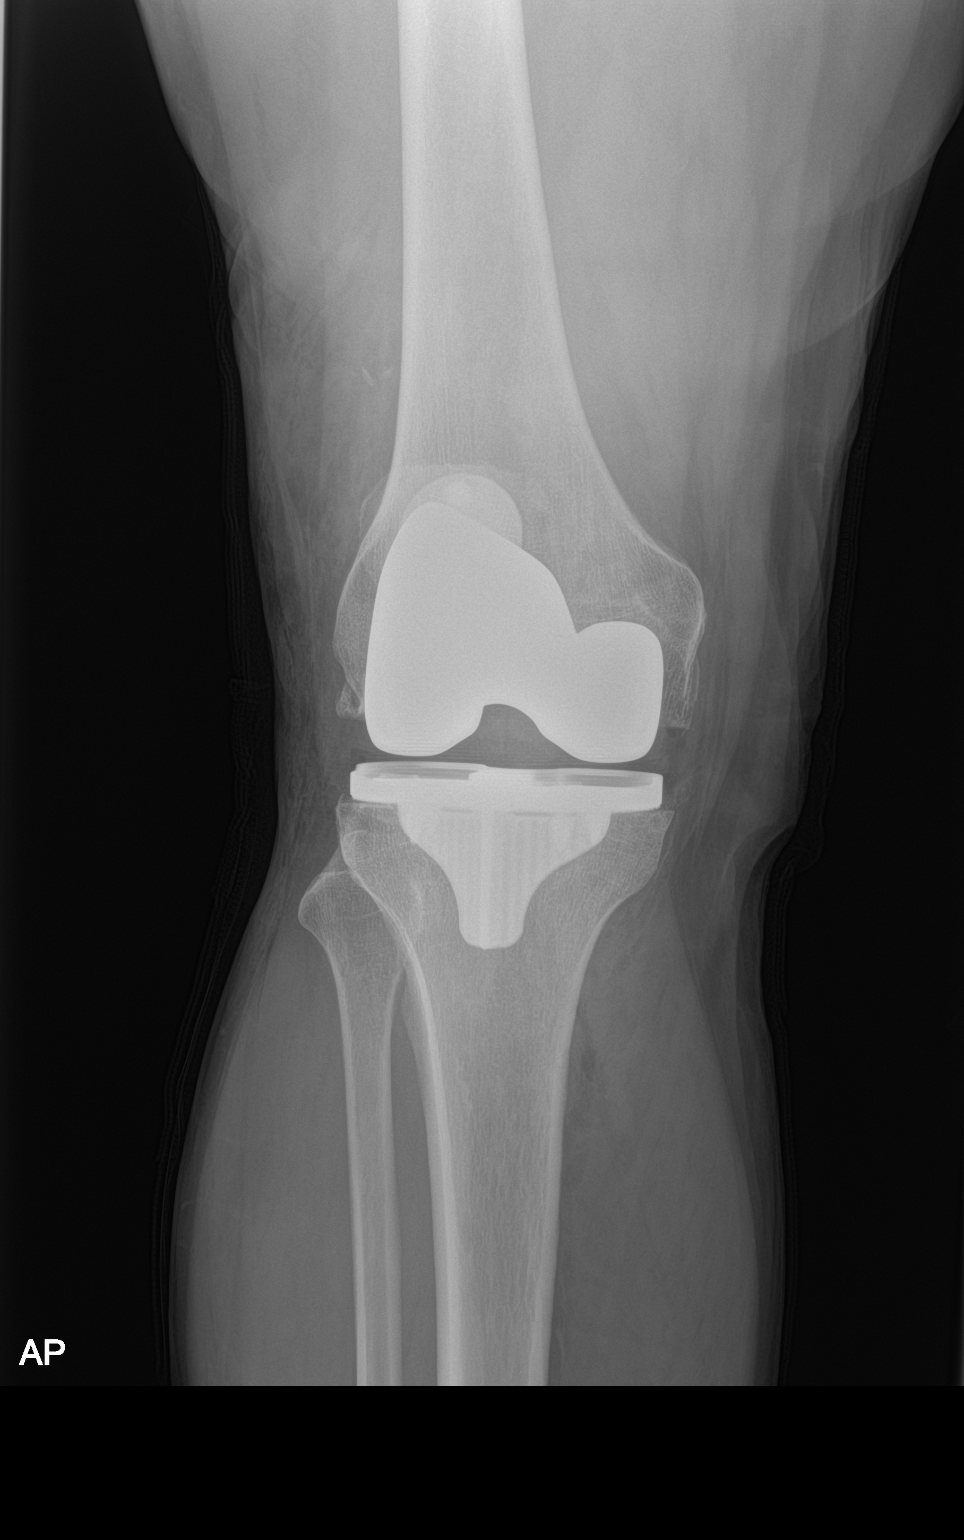

[knee lat]
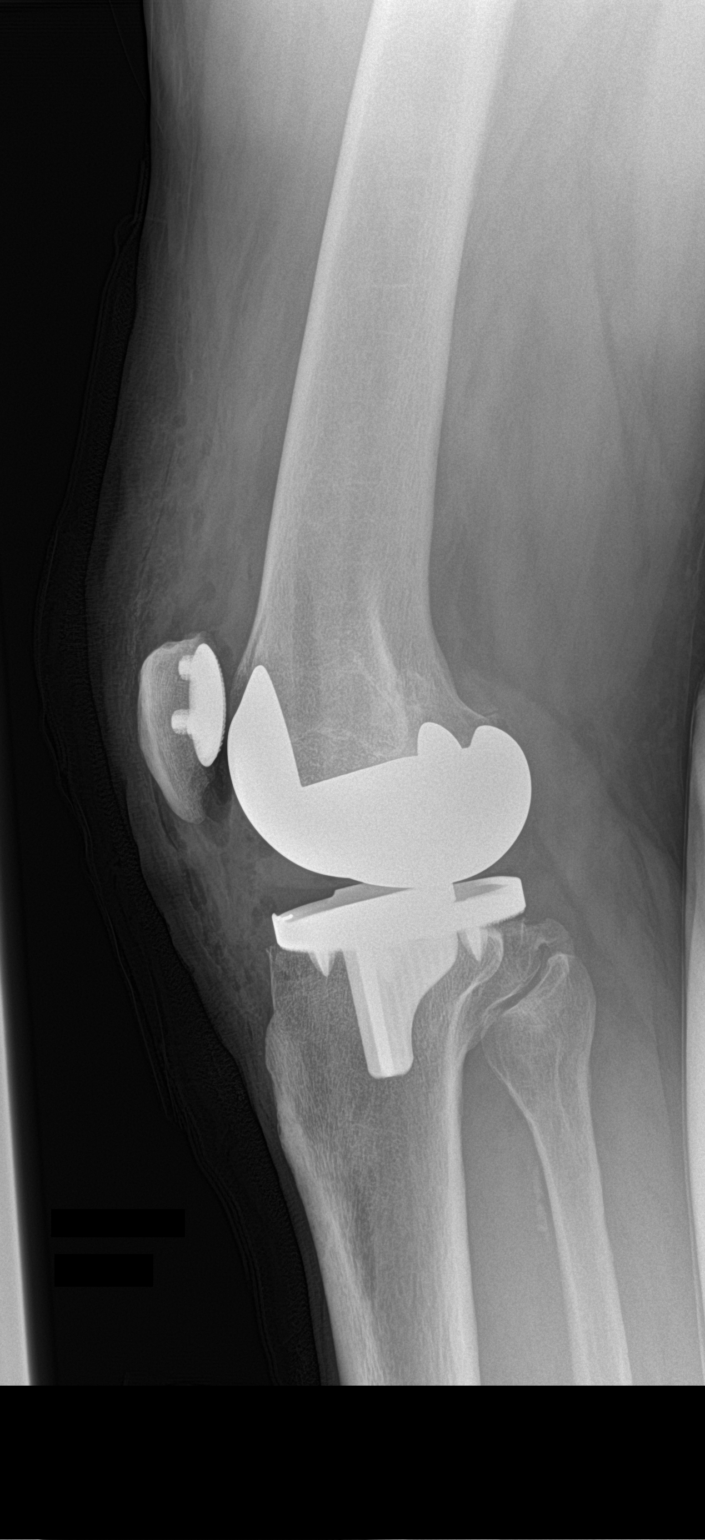

[2 of 2 positions shown; findings below may reference images not displayed]

FINDINGS: Right total knee arthroplasty is in place. There is some gas in the
soft tissues from surgery. No fracture. Hardware is normal in
appearance.
IMPRESSION: Status post right total knee replacement.  No acute finding.

## 2019-11-01 DIAGNOSIS — Z961 Presence of intraocular lens: Secondary | ICD-10-CM | POA: Diagnosis not present

## 2020-02-22 ENCOUNTER — Telehealth: Payer: Self-pay

## 2020-02-22 NOTE — Telephone Encounter (Signed)
NOTES ON FILE FROM [facility] [phone], SENT REFERRAL TO SCHEDULING 

## 2020-02-29 ENCOUNTER — Encounter: Payer: Self-pay | Admitting: Cardiology

## 2020-02-29 ENCOUNTER — Other Ambulatory Visit: Payer: Self-pay

## 2020-02-29 ENCOUNTER — Ambulatory Visit (INDEPENDENT_AMBULATORY_CARE_PROVIDER_SITE_OTHER): Payer: 59 | Admitting: Cardiology

## 2020-02-29 ENCOUNTER — Other Ambulatory Visit: Payer: Self-pay | Admitting: Cardiology

## 2020-02-29 VITALS — BP 142/70 | HR 66 | Ht 71.0 in | Wt 263.0 lb

## 2020-02-29 DIAGNOSIS — I200 Unstable angina: Secondary | ICD-10-CM

## 2020-02-29 DIAGNOSIS — I10 Essential (primary) hypertension: Secondary | ICD-10-CM

## 2020-02-29 DIAGNOSIS — E7800 Pure hypercholesterolemia, unspecified: Secondary | ICD-10-CM

## 2020-02-29 MED ORDER — ATORVASTATIN CALCIUM 80 MG PO TABS
80.0000 mg | ORAL_TABLET | Freq: Every day | ORAL | 3 refills | Status: DC
Start: 2020-02-29 — End: 2020-11-25

## 2020-02-29 MED ORDER — ASPIRIN EC 81 MG PO TBEC: 81.0000 mg | DELAYED_RELEASE_TABLET | Freq: Every day | ORAL | 3 refills | Status: DC

## 2020-02-29 MED ORDER — PREDNISONE 50 MG PO TABS
ORAL_TABLET | ORAL | 0 refills | Status: DC
Start: 2020-02-29 — End: 2020-03-25

## 2020-02-29 MED ORDER — NITROGLYCERIN 0.4 MG SL SUBL
0.4000 mg | SUBLINGUAL_TABLET | SUBLINGUAL | 3 refills | Status: DC | PRN
Start: 2020-02-29 — End: 2020-11-14

## 2020-02-29 MED ORDER — SODIUM CHLORIDE 0.9% FLUSH: 3.0000 mL | Freq: Two times a day (BID) | INTRAVENOUS | Status: DC

## 2020-02-29 MED ORDER — DIPHENHYDRAMINE HCL 50 MG PO TABS
ORAL_TABLET | ORAL | 0 refills | Status: DC
Start: 2020-02-29 — End: 2020-03-25

## 2020-02-29 NOTE — Progress Notes (Signed)
Cardiology Office Note   Date:  02/29/2020   ID:  [name], DOB [birth year], MRN [id number]  PCP:  Caren Macadam, MD  Cardiologist:   Dagmar Adcox [place], MD   Chief Complaint  Patient presents with  . Chest Pain      History of Present Illness: Stephen Scott is a 59 y.o. male who is seen at the request of Dr Ranell Patrick for evaluation of chest pain and dyspnea. He has a history of HTN and HLD. He reports that over the past 4 months he has not felt well. He has intermittent burning in his upper chest initially with exertion but now sometimes at rest. Notes SOB with exertion and lack of energy. One week ago he developed sudden severe pain in his jaw bilaterally radiating to his left arm and associated with sweating. This lasted 20-25 minutes. Seen the next day by primary care and Ecg was normal. He hasn't had this pain again.   He does note that he has been under a lot of stress. Feels very depressed and anxious. Has no drive. He has lumbar stenosis which limits his walking. BP has been high and amlodipine just increased last week. He has a strong family history of CAD.    Past Medical History:  Diagnosis Date  . Anemia    as a small child  . Anxiety   . Arthritis    "lower spine; knees" (11/01/2017)  . Chronic lower back pain   . Depression   . Family history of adverse reaction to anesthesia    "daughter PONV"  . GERD (gastroesophageal reflux disease)   . High cholesterol   . History of kidney stones   . Hypertension   . Pneumonia 06/2017; 08/2017   walking pneumonia; treated  . Spinal stenosis    "lower back" (11/01/2017)    Past Surgical History:  Procedure Laterality Date  . HAND RECONSTRUCTION Left 1989   "injured when washing dishes"  . JOINT REPLACEMENT    . KNEE ARTHROSCOPY Right 2008  . TOTAL KNEE ARTHROPLASTY Right 11/01/2017  . TOTAL KNEE ARTHROPLASTY Right 11/01/2017   Procedure: RIGHT TOTAL KNEE ARTHROPLASTY;  Surgeon: Mcarthur Rossetti, MD;   Location: [facility];  Service: Orthopedics;  Laterality: Right;  . WISDOM TOOTH EXTRACTION  1981     Current Outpatient Medications  Medication Sig Dispense Refill  . amLODipine (NORVASC) 5 MG tablet TK 1 T PO QD    . buPROPion (WELLBUTRIN XL) 300 MG 24 hr tablet TK 1 T PO BID    . famotidine (PEPCID) 20 MG tablet Take 20 mg by mouth 2 (two) times daily.    Marland Kitchen losartan (COZAAR) 100 MG tablet Take 100 mg by mouth daily.    . sertraline (ZOLOFT) 50 MG tablet Take 50 mg by mouth daily.    Marland Kitchen sulfamethoxazole-trimethoprim (BACTRIM DS) 800-160 MG tablet Take 1 tablet by mouth 2 (two) times daily. For 10 days    . aspirin EC 81 MG tablet Take 1 tablet (81 mg total) by mouth daily. 90 tablet 3  . atorvastatin (LIPITOR) 80 MG tablet Take 1 tablet (80 mg total) by mouth daily. 90 tablet 3  . diphenhydrAMINE (BENADRYL) 50 MG tablet Take 50 mg before leaving home for cardiac cath 1 tablet 0  . nitroGLYCERIN (NITROSTAT) 0.4 MG SL tablet Place 1 tablet (0.4 mg total) under the tongue every 5 (five) minutes as needed for chest pain. 90 tablet 3  . predniSONE (DELTASONE) 50 MG tablet Take  50 mg 13 hours before cath and 50 mg 7 hours before cath and 50 mg before going to hospital 3 tablet 0   No current facility-administered medications for this visit.    Allergies:   Iodides and Tetanus toxoids    Social History:  The patient  reports that he has never smoked. He has never used smokeless tobacco. He reports current alcohol use of about 3.0 standard drinks of alcohol per week. He reports that he does not use drugs.   Family History:  The patient's family history includes Clotting disorder in his mother; Diabetes in his mother; Heart attack in his brother; Heart disease in his brother; Hyperlipidemia in his mother; Hypertension in his mother; Pulmonary embolism in his mother.    ROS:  Please see the history of present illness.   Otherwise, review of systems are positive for none.   All other systems are  reviewed and negative.    PHYSICAL EXAM: VS:  BP (!) 142/70   Pulse 66   Ht 5' 11" (1.803 m)   Wt 263 lb (119.3 kg)   BMI 36.68 kg/m  , BMI Body mass index is 36.68 kg/m. GEN: Well nourished, obese, in no acute distress  HEENT: normal  Neck: no JVD, carotid bruits, or masses Cardiac: RRR; no murmurs, rubs, or gallops,no edema  Respiratory:  clear to auscultation bilaterally, normal work of breathing GI: soft, nontender, nondistended, + BS MS: no deformity or atrophy  Skin: warm and dry, no rash Neuro:  Strength and sensation are intact Psych: euthymic mood, full affect   EKG:  EKG is ordered today. The ekg ordered today demonstrates NSR with Normal Ecg. I have personally reviewed and interpreted this study.    Recent Labs: No results found for requested labs within last 8760 hours.    Lipid Panel No results found for: CHOL, TRIG, HDL, CHOLHDL, VLDL, LDLCALC, LDLDIRECT    Wt Readings from Last 3 Encounters:  02/29/20 263 lb (119.3 kg)  11/01/17 261 lb 1.6 oz (118.4 kg)  10/21/17 261 lb 1.6 oz (118.4 kg)      Other studies Reviewed: Additional studies/ records that were reviewed today include:   Labs dated 02/21/20: cholesterol 204, triglycerides 133, HDL 44, LDL 136. CBC, chemistries and TSH  Normal.   ASSESSMENT AND PLAN:  1.  Unstable angina. Symptoms very concerning. No evidence of acute ischemia on Ecg. Recommend ASA 81 mg daily. Will switch pravastatin to high dose lipitor. Continue amlodipine and given a Rx for sl Ntg and instructed in its use. Told if pain does not quickly resolve he should call EMS. We discussed options for further evaluation of his coronary arteries including stress testing, Coronary CTA or cardiac cath. Based on recent symptoms I feel he is at high risk and recommend proceeding directly to invasive evaluation with cardiac cath with PCI if needed. Scheduled for Tuesday June 8.  The procedure and risks were reviewed including but not limited  to death, myocardial infarction, stroke, arrythmias, bleeding, transfusion, emergency surgery, dye allergy, or renal dysfunction. The patient voices understanding and is agreeable to proceed. Given history of contrast allergy he will be pretreated with prednisone and Benadryl. 2 . HTN. Continue higher dose of amlodipine 3. Hypercholesterolemia. LDL 136 on pravastatin. Will switch to lipitor 80 mg daily. 4. Family history of CAD 5. Obesity 6. Depression/anxiety. RX as per Dr Hagler.     Current medicines are reviewed at length with the patient today.  The patient does not have   concerns regarding medicines.  The following changes have been made:  See above  Labs/ tests ordered today include:   Orders Placed This Encounter  Procedures  . EKG 12-Lead     Disposition:   FU with me post cardiac cath.  Signed, Anastacio Bua [place], MD  02/29/2020 12:27 PM    [facility] [address] [place], [place], [address] Phone [phone], Fax [phone]

## 2020-02-29 NOTE — H&P (View-Only) (Signed)
Cardiology Office Note   Date:  02/29/2020   ID:  Stephen Scott, DOB [birth year], MRN [id number]  PCP:  Caren Macadam, MD  Cardiologist:   Kilian Schwartz [place], MD   Chief Complaint  Patient presents with  . Chest Pain      History of Present Illness: Stephen Scott is a 59 y.o. male who is seen at the request of Dr Ranell Patrick for evaluation of chest pain and dyspnea. He has a history of HTN and HLD. He reports that over the past 4 months he has not felt well. He has intermittent burning in his upper chest initially with exertion but now sometimes at rest. Notes SOB with exertion and lack of energy. One week ago he developed sudden severe pain in his jaw bilaterally radiating to his left arm and associated with sweating. This lasted 20-25 minutes. Seen the next day by primary care and Ecg was normal. He hasn't had this pain again.   He does note that he has been under a lot of stress. Feels very depressed and anxious. Has no drive. He has lumbar stenosis which limits his walking. BP has been high and amlodipine just increased last week. He has a strong family history of CAD.    Past Medical History:  Diagnosis Date  . Anemia    as a small child  . Anxiety   . Arthritis    "lower spine; knees" (11/01/2017)  . Chronic lower back pain   . Depression   . Family history of adverse reaction to anesthesia    "daughter PONV"  . GERD (gastroesophageal reflux disease)   . High cholesterol   . History of kidney stones   . Hypertension   . Pneumonia 06/2017; 08/2017   walking pneumonia; treated  . Spinal stenosis    "lower back" (11/01/2017)    Past Surgical History:  Procedure Laterality Date  . HAND RECONSTRUCTION Left 1989   "injured when washing dishes"  . JOINT REPLACEMENT    . KNEE ARTHROSCOPY Right 2008  . TOTAL KNEE ARTHROPLASTY Right 11/01/2017  . TOTAL KNEE ARTHROPLASTY Right 11/01/2017   Procedure: RIGHT TOTAL KNEE ARTHROPLASTY;  Surgeon: Mcarthur Rossetti, MD;   Location: [facility];  Service: Orthopedics;  Laterality: Right;  . WISDOM TOOTH EXTRACTION  1981     Current Outpatient Medications  Medication Sig Dispense Refill  . amLODipine (NORVASC) 5 MG tablet TK 1 T PO QD    . buPROPion (WELLBUTRIN XL) 300 MG 24 hr tablet TK 1 T PO BID    . famotidine (PEPCID) 20 MG tablet Take 20 mg by mouth 2 (two) times daily.    Marland Kitchen losartan (COZAAR) 100 MG tablet Take 100 mg by mouth daily.    . sertraline (ZOLOFT) 50 MG tablet Take 50 mg by mouth daily.    Marland Kitchen sulfamethoxazole-trimethoprim (BACTRIM DS) 800-160 MG tablet Take 1 tablet by mouth 2 (two) times daily. For 10 days    . aspirin EC 81 MG tablet Take 1 tablet (81 mg total) by mouth daily. 90 tablet 3  . atorvastatin (LIPITOR) 80 MG tablet Take 1 tablet (80 mg total) by mouth daily. 90 tablet 3  . diphenhydrAMINE (BENADRYL) 50 MG tablet Take 50 mg before leaving home for cardiac cath 1 tablet 0  . nitroGLYCERIN (NITROSTAT) 0.4 MG SL tablet Place 1 tablet (0.4 mg total) under the tongue every 5 (five) minutes as needed for chest pain. 90 tablet 3  . predniSONE (DELTASONE) 50 MG tablet Take  50 mg 13 hours before cath and 50 mg 7 hours before cath and 50 mg before going to hospital 3 tablet 0   No current facility-administered medications for this visit.    Allergies:   Iodides and Tetanus toxoids    Social History:  The patient  reports that he has never smoked. He has never used smokeless tobacco. He reports current alcohol use of about 3.0 standard drinks of alcohol per week. He reports that he does not use drugs.   Family History:  The patient's family history includes Clotting disorder in his mother; Diabetes in his mother; Heart attack in his brother; Heart disease in his brother; Hyperlipidemia in his mother; Hypertension in his mother; Pulmonary embolism in his mother.    ROS:  Please see the history of present illness.   Otherwise, review of systems are positive for none.   All other systems are  reviewed and negative.    PHYSICAL EXAM: VS:  BP (!) 142/70   Pulse 66   Ht 5\' 11"  (1.803 m)   Wt 263 lb (119.3 kg)   BMI 36.68 kg/m  , BMI Body mass index is 36.68 kg/m. GEN: Well nourished, obese, in no acute distress  HEENT: normal  Neck: no JVD, carotid bruits, or masses Cardiac: RRR; no murmurs, rubs, or gallops,no edema  Respiratory:  clear to auscultation bilaterally, normal work of breathing GI: soft, nontender, nondistended, + BS MS: no deformity or atrophy  Skin: warm and dry, no rash Neuro:  Strength and sensation are intact Psych: euthymic mood, full affect   EKG:  EKG is ordered today. The ekg ordered today demonstrates NSR with Normal Ecg. I have personally reviewed and interpreted this study.    Recent Labs: No results found for requested labs within last 8760 hours.    Lipid Panel No results found for: CHOL, TRIG, HDL, CHOLHDL, VLDL, LDLCALC, LDLDIRECT    Wt Readings from Last 3 Encounters:  02/29/20 263 lb (119.3 kg)  11/01/17 261 lb 1.6 oz (118.4 kg)  10/21/17 261 lb 1.6 oz (118.4 kg)      Other studies Reviewed: Additional studies/ records that were reviewed today include:   Labs dated 02/21/20: cholesterol 204, triglycerides 133, HDL 44, LDL 136. CBC, chemistries and TSH  Normal.   ASSESSMENT AND PLAN:  1.  Unstable angina. Symptoms very concerning. No evidence of acute ischemia on Ecg. Recommend ASA 81 mg daily. Will switch pravastatin to high dose lipitor. Continue amlodipine and given a Rx for sl Ntg and instructed in its use. Told if pain does not quickly resolve he should call EMS. We discussed options for further evaluation of his coronary arteries including stress testing, Coronary CTA or cardiac cath. Based on recent symptoms I feel he is at high risk and recommend proceeding directly to invasive evaluation with cardiac cath with PCI if needed. Scheduled for Tuesday June 8.  The procedure and risks were reviewed including but not limited  to death, myocardial infarction, stroke, arrythmias, bleeding, transfusion, emergency surgery, dye allergy, or renal dysfunction. The patient voices understanding and is agreeable to proceed. Given history of contrast allergy he will be pretreated with prednisone and Benadryl. 2 . HTN. Continue higher dose of amlodipine 3. Hypercholesterolemia. LDL 136 on pravastatin. Will switch to lipitor 80 mg daily. 4. Family history of CAD 5. Obesity 6. Depression/anxiety. RX as per Dr 12-09-1995.     Current medicines are reviewed at length with the patient today.  The patient does not have  concerns regarding medicines.  The following changes have been made:  See above  Labs/ tests ordered today include:   Orders Placed This Encounter  Procedures  . EKG 12-Lead     Disposition:   FU with me post cardiac cath.  Signed, Luanna Weesner [place], MD  02/29/2020 12:27 PM    [facility] [address] [place], [place], [address] Phone [phone], Fax [phone]

## 2020-02-29 NOTE — Patient Instructions (Addendum)
Take ASA 81 mg daily  Change pravastatin to Lipitor 80 mg daily  If you have chest pain take Ntg sublingual. If pain doesn't go away call EMS.   Follow up appointment scheduled with Azalee Course PA Tuesday 03/18/20 at 11:45 am        [facility] CARDIOVASCULAR DIVISION [facility] [address] [place] [address] Dept: [phone] Loc: [phone]  Stephen Scott  02/29/2020  You are scheduled for a Cardiac Cath on Tuesday 03/04/20, with Dr.Patriciaann Rabanal.  1. Please arrive at the [facility] (Main Entrance A) at [facility]: [address] [place], [place] [address] at 10:00 am (This time is two hours before your procedure to ensure your preparation). Free valet parking service is available.   Special note: Every effort is made to have your procedure done on time. Please understand that emergencies sometimes delay scheduled procedures.  2. Diet: Do not eat solid foods after midnight.  The patient may have clear liquids until 5am upon the day of the procedure.  3. Labs: You will not  need to have blood drawn. Recent lab with PCP Covid Test Saturday 6/5 at 10:55 am at [facility] drive thru pre procedure line under brick awning.After Covid you will need to quarantine until after cath.  4. Medication instructions in preparation for your procedure:   Contrast Allergy:     Prednisone 50 mg 13 hours before   Prednisone 50 mg 7 hour before  Prednisone 50 mg and Benadryl 50 mg just before leaving for hospital     On the morning of your procedure, take Aspirin 81 mg and any morning medicines NOT listed above.  You may use sips of water.  5. Plan for one night stay--bring personal belongings. 6. Bring a current list of your medications and current insurance cards. 7. You MUST have a responsible person to drive you home. 8. Someone MUST be with you the first 24 hours after you arrive home or your discharge will be  delayed. 9. Please wear clothes that are easy to get on and off and wear slip-on shoes.  Thank you for allowing [place] to care for you!   -- [facility]

## 2020-03-01 ENCOUNTER — Other Ambulatory Visit (HOSPITAL_COMMUNITY)
Admission: RE | Admit: 2020-03-01 | Discharge: 2020-03-01 | Disposition: A | Discharge status: Home / Self Care | Payer: 59 | Source: Ambulatory Visit | Attending: Cardiology | Admitting: Cardiology

## 2020-03-01 DIAGNOSIS — Z20822 Contact with and (suspected) exposure to covid-19: Secondary | ICD-10-CM | POA: Insufficient documentation

## 2020-03-01 DIAGNOSIS — Z01812 Encounter for preprocedural laboratory examination: Secondary | ICD-10-CM | POA: Insufficient documentation

## 2020-03-01 LAB — SARS CORONAVIRUS 2 (TAT 6-24 HRS): SARS Coronavirus 2: NEGATIVE

## 2020-03-03 ENCOUNTER — Telehealth: Payer: Self-pay | Admitting: *Deleted

## 2020-03-03 NOTE — Telephone Encounter (Signed)
Pt contacted pre-catheterization scheduled at [facility] for: Tuesday March 04, 2020 12 noon Verified arrival time and place: [facility] Main Entrance A [facility]) at: 10 AM   No solid food after midnight prior to cath, clear liquids until 5 AM day of procedure.  Contrast allergy: yes-13 hour Prednisone and Benadryl Prep reviewed with patient: 03/03/20 Prednisone 50 mg 11 PM 03/04/20 Prednisone 50 mg 5 AM 03/04/20 Prednisone 50 mg and benadryl 50 mg -just prior to leaving home for procedure. Pt advised not to drive to hospital.  AM meds can be  taken pre-cath with sip of water including: ASA 81 mg Prednisone 50 mg Benadryl 50 mg  Confirmed patient has responsible adult to drive home post procedure and observe 24 hours after arriving home: yes  You are allowed ONE visitor in the waiting room during your procedure. Both you and your visitor must wear masks.      COVID-19 Pre-Screening Questions:  . In the past 7 to 10 days have you had a cough,  shortness of breath, headache, congestion, fever (100 or greater) body aches, chills, sore throat, or sudden loss of taste or sense of smell? Recent siinus infection/congestion/cough, shortness of breath- no fever . Have you been around anyone with known Covid 19 in the past 7 to 10 days? *see below . Have you been around anyone who is awaiting Covid 19 test results in the past 7 to 10 days? no . Have you been around anyone who has mentioned symptoms of Covid 19 within the past 7 to 10 days? 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   Reviewed procedure/mask/visitor instructions, COVID-19 screening questions with patient.  Lab results done at PCP 02/21/20 scanned in Epic under Lab- date 08/08/18-update 03/03/20.

## 2020-03-04 ENCOUNTER — Encounter (HOSPITAL_COMMUNITY): Payer: Self-pay | Admitting: Cardiology

## 2020-03-04 ENCOUNTER — Other Ambulatory Visit (HOSPITAL_COMMUNITY): Payer: Self-pay | Admitting: Physician Assistant

## 2020-03-04 ENCOUNTER — Ambulatory Visit (HOSPITAL_COMMUNITY)
Admission: RE | Disposition: A | Discharge status: Home / Self Care | Payer: Self-pay | Source: Home / Self Care | Attending: Cardiology

## 2020-03-04 ENCOUNTER — Ambulatory Visit (HOSPITAL_COMMUNITY)
Admission: RE | Admit: 2020-03-04 | Discharge: 2020-03-04 | Disposition: A | Discharge status: Home / Self Care | Payer: 59 | Attending: Cardiology | Admitting: Cardiology

## 2020-03-04 ENCOUNTER — Other Ambulatory Visit: Payer: Self-pay

## 2020-03-04 DIAGNOSIS — M1990 Unspecified osteoarthritis, unspecified site: Secondary | ICD-10-CM | POA: Insufficient documentation

## 2020-03-04 DIAGNOSIS — I10 Essential (primary) hypertension: Secondary | ICD-10-CM | POA: Diagnosis not present

## 2020-03-04 DIAGNOSIS — Z79899 Other long term (current) drug therapy: Secondary | ICD-10-CM | POA: Insufficient documentation

## 2020-03-04 DIAGNOSIS — E7800 Pure hypercholesterolemia, unspecified: Secondary | ICD-10-CM | POA: Insufficient documentation

## 2020-03-04 DIAGNOSIS — F419 Anxiety disorder, unspecified: Secondary | ICD-10-CM | POA: Insufficient documentation

## 2020-03-04 DIAGNOSIS — Z955 Presence of coronary angioplasty implant and graft: Secondary | ICD-10-CM

## 2020-03-04 DIAGNOSIS — E669 Obesity, unspecified: Secondary | ICD-10-CM | POA: Insufficient documentation

## 2020-03-04 DIAGNOSIS — I200 Unstable angina: Secondary | ICD-10-CM | POA: Diagnosis present

## 2020-03-04 DIAGNOSIS — Z6836 Body mass index (BMI) 36.0-36.9, adult: Secondary | ICD-10-CM | POA: Diagnosis not present

## 2020-03-04 DIAGNOSIS — E785 Hyperlipidemia, unspecified: Secondary | ICD-10-CM | POA: Diagnosis present

## 2020-03-04 DIAGNOSIS — Z8249 Family history of ischemic heart disease and other diseases of the circulatory system: Secondary | ICD-10-CM | POA: Diagnosis not present

## 2020-03-04 DIAGNOSIS — F329 Major depressive disorder, single episode, unspecified: Secondary | ICD-10-CM | POA: Diagnosis not present

## 2020-03-04 DIAGNOSIS — Z887 Allergy status to serum and vaccine status: Secondary | ICD-10-CM | POA: Diagnosis not present

## 2020-03-04 DIAGNOSIS — I25110 Atherosclerotic heart disease of native coronary artery with unstable angina pectoris: Secondary | ICD-10-CM | POA: Diagnosis not present

## 2020-03-04 DIAGNOSIS — Z7982 Long term (current) use of aspirin: Secondary | ICD-10-CM | POA: Diagnosis not present

## 2020-03-04 DIAGNOSIS — K219 Gastro-esophageal reflux disease without esophagitis: Secondary | ICD-10-CM | POA: Diagnosis not present

## 2020-03-04 DIAGNOSIS — Z888 Allergy status to other drugs, medicaments and biological substances status: Secondary | ICD-10-CM | POA: Diagnosis not present

## 2020-03-04 DIAGNOSIS — Z96651 Presence of right artificial knee joint: Secondary | ICD-10-CM | POA: Diagnosis not present

## 2020-03-04 HISTORY — PX: INTRAVASCULAR ULTRASOUND/IVUS: CATH118244

## 2020-03-04 HISTORY — PX: CORONARY STENT INTERVENTION: CATH118234

## 2020-03-04 HISTORY — PX: LEFT HEART CATH AND CORONARY ANGIOGRAPHY: CATH118249

## 2020-03-04 HISTORY — PX: INTRAVASCULAR PRESSURE WIRE/FFR STUDY: CATH118243

## 2020-03-04 LAB — CBC
HCT: 47.4 % (ref 39.0–52.0)
Hemoglobin: 15.8 g/dL (ref 13.0–17.0)
MCH: 29.3 pg (ref 26.0–34.0)
MCHC: 33.3 g/dL (ref 30.0–36.0)
MCV: 87.9 fL (ref 80.0–100.0)
Platelets: 277 10*3/uL (ref 150–400)
RBC: 5.39 MIL/uL (ref 4.22–5.81)
RDW: 12.7 % (ref 11.5–15.5)
WBC: 9.3 10*3/uL (ref 4.0–10.5)
nRBC: 0 % (ref 0.0–0.2)

## 2020-03-04 LAB — BASIC METABOLIC PANEL
Anion gap: 13 (ref 5–15)
BUN: 16 mg/dL (ref 6–20)
CO2: 20 mmol/L — ABNORMAL LOW (ref 22–32)
Calcium: 9.4 mg/dL (ref 8.9–10.3)
Chloride: 104 mmol/L (ref 98–111)
Creatinine, Ser: 0.86 mg/dL (ref 0.61–1.24)
GFR calc Af Amer: >60 mL/min (ref >60)
GFR calc non Af Amer: >60 mL/min (ref >60)
Glucose, Bld: 173 mg/dL — ABNORMAL HIGH (ref 70–99)
Potassium: 4.5 mmol/L (ref 3.5–5.1)
Sodium: 137 mmol/L (ref 135–145)

## 2020-03-04 LAB — POCT ACTIVATED CLOTTING TIME
Activated Clotting Time: 274 seconds
Activated Clotting Time: 368 s

## 2020-03-04 SURGERY — LEFT HEART CATH AND CORONARY ANGIOGRAPHY
Anesthesia: LOCAL

## 2020-03-04 MED ORDER — ATORVASTATIN CALCIUM 80 MG PO TABS: 80.0000 mg | ORAL_TABLET | Freq: Every day | ORAL | Status: DC

## 2020-03-04 MED ORDER — SERTRALINE HCL 100 MG PO TABS: 100.0000 mg | ORAL_TABLET | Freq: Every day | ORAL | Status: DC

## 2020-03-04 MED ORDER — HEPARIN (PORCINE) IN NACL 1000-0.9 UT/500ML-% IV SOLN
INTRAVENOUS | Status: DC | PRN
  Administered 2020-03-04 (×2): 500 mL

## 2020-03-04 MED ORDER — SODIUM CHLORIDE 0.9 % IV SOLN: 250.0000 mL | INTRAVENOUS | Status: DC | PRN

## 2020-03-04 MED ORDER — GABAPENTIN 300 MG PO CAPS: 300.0000 mg | ORAL_CAPSULE | Freq: Every day | ORAL | Status: DC

## 2020-03-04 MED ORDER — LORATADINE 10 MG PO TABS: 10.0000 mg | ORAL_TABLET | Freq: Every day | ORAL | Status: DC | PRN

## 2020-03-04 MED ORDER — ASPIRIN 81 MG PO CHEW: 81.0000 mg | CHEWABLE_TABLET | ORAL | Status: DC

## 2020-03-04 MED ORDER — NITROGLYCERIN 1 MG/10 ML FOR IR/CATH LAB
INTRA_ARTERIAL | Status: AC
  Filled 2020-03-04: qty 10

## 2020-03-04 MED ORDER — NITROGLYCERIN 2 % TD OINT
1.0000 [in_us] | TOPICAL_OINTMENT | Freq: Four times a day (QID) | TRANSDERMAL | Status: DC
  Administered 2020-03-04: 1 [in_us] via TOPICAL
  Filled 2020-03-04: qty 30

## 2020-03-04 MED ORDER — SODIUM CHLORIDE 0.9 % WEIGHT BASED INFUSION: 1.0000 mL/kg/h | INTRAVENOUS | Status: DC

## 2020-03-04 MED ORDER — ACETAMINOPHEN 325 MG PO TABS: 650.0000 mg | ORAL_TABLET | ORAL | Status: DC | PRN

## 2020-03-04 MED ORDER — VERAPAMIL HCL 2.5 MG/ML IV SOLN
INTRAVENOUS | Status: AC
  Filled 2020-03-04: qty 2

## 2020-03-04 MED ORDER — SODIUM CHLORIDE 0.9 % WEIGHT BASED INFUSION
3.0000 mL/kg/h | INTRAVENOUS | Status: AC
  Administered 2020-03-04: 3 mL/kg/h via INTRAVENOUS

## 2020-03-04 MED ORDER — LIDOCAINE HCL (PF) 1 % IJ SOLN
INTRAMUSCULAR | Status: AC
  Filled 2020-03-04: qty 30

## 2020-03-04 MED ORDER — METHOCARBAMOL 500 MG PO TABS: 500.0000 mg | ORAL_TABLET | Freq: Three times a day (TID) | ORAL | Status: DC | PRN

## 2020-03-04 MED ORDER — HEPARIN (PORCINE) IN NACL 1000-0.9 UT/500ML-% IV SOLN
INTRAVENOUS | Status: AC
  Filled 2020-03-04: qty 1000

## 2020-03-04 MED ORDER — MIDAZOLAM HCL 2 MG/2ML IJ SOLN
INTRAMUSCULAR | Status: AC
  Filled 2020-03-04: qty 2

## 2020-03-04 MED ORDER — SODIUM CHLORIDE 0.9% FLUSH: 3.0000 mL | INTRAVENOUS | Status: DC | PRN

## 2020-03-04 MED ORDER — BUPROPION HCL ER (XL) 300 MG PO TB24: 300.0000 mg | ORAL_TABLET | Freq: Every day | ORAL | Status: DC

## 2020-03-04 MED ORDER — TICAGRELOR 90 MG PO TABS: 90.0000 mg | ORAL_TABLET | Freq: Two times a day (BID) | ORAL | Status: DC

## 2020-03-04 MED ORDER — NITROGLYCERIN 1 MG/10 ML FOR IR/CATH LAB
INTRA_ARTERIAL | Status: DC | PRN
  Administered 2020-03-04: 200 ug via INTRACORONARY

## 2020-03-04 MED ORDER — SODIUM CHLORIDE 0.9% FLUSH: 3.0000 mL | Freq: Two times a day (BID) | INTRAVENOUS | Status: DC

## 2020-03-04 MED ORDER — IOHEXOL 350 MG/ML SOLN
INTRAVENOUS | Status: DC | PRN
  Administered 2020-03-04: 140 mL

## 2020-03-04 MED ORDER — HEPARIN SODIUM (PORCINE) 1000 UNIT/ML IJ SOLN
INTRAMUSCULAR | Status: AC
  Filled 2020-03-04: qty 1

## 2020-03-04 MED ORDER — FENTANYL CITRATE (PF) 100 MCG/2ML IJ SOLN
INTRAMUSCULAR | Status: AC
  Filled 2020-03-04: qty 2

## 2020-03-04 MED ORDER — FENTANYL CITRATE (PF) 100 MCG/2ML IJ SOLN
INTRAMUSCULAR | Status: DC | PRN
  Administered 2020-03-04 (×4): 25 ug via INTRAVENOUS

## 2020-03-04 MED ORDER — TICAGRELOR 90 MG PO TABS: 90.0000 mg | ORAL_TABLET | Freq: Two times a day (BID) | ORAL | 11 refills | Status: DC

## 2020-03-04 MED ORDER — MIDAZOLAM HCL 2 MG/2ML IJ SOLN
INTRAMUSCULAR | Status: DC | PRN
  Administered 2020-03-04 (×2): 1 mg via INTRAVENOUS

## 2020-03-04 MED ORDER — HYDRALAZINE HCL 20 MG/ML IJ SOLN
10.0000 mg | INTRAMUSCULAR | Status: DC | PRN
  Administered 2020-03-04: 10 mg via INTRAVENOUS
  Filled 2020-03-04: qty 1

## 2020-03-04 MED ORDER — TICAGRELOR 90 MG PO TABS
ORAL_TABLET | ORAL | Status: AC
  Filled 2020-03-04: qty 2

## 2020-03-04 MED ORDER — LOSARTAN POTASSIUM 50 MG PO TABS: 100.0000 mg | ORAL_TABLET | Freq: Every day | ORAL | Status: DC

## 2020-03-04 MED ORDER — ASPIRIN EC 81 MG PO TBEC: 81.0000 mg | DELAYED_RELEASE_TABLET | Freq: Every day | ORAL | Status: DC

## 2020-03-04 MED ORDER — FAMOTIDINE 20 MG PO TABS: 20.0000 mg | ORAL_TABLET | Freq: Every evening | ORAL | Status: DC | PRN

## 2020-03-04 MED ORDER — TICAGRELOR 90 MG PO TABS
ORAL_TABLET | ORAL | Status: DC | PRN
  Administered 2020-03-04: 180 mg via ORAL

## 2020-03-04 MED ORDER — NITROGLYCERIN 0.4 MG SL SUBL: 0.4000 mg | SUBLINGUAL_TABLET | SUBLINGUAL | Status: DC | PRN

## 2020-03-04 MED ORDER — HEPARIN SODIUM (PORCINE) 1000 UNIT/ML IJ SOLN
INTRAMUSCULAR | Status: DC | PRN
  Administered 2020-03-04: 2000 [IU] via INTRAVENOUS
  Administered 2020-03-04: 5000 [IU] via INTRAVENOUS
  Administered 2020-03-04: 5500 [IU] via INTRAVENOUS

## 2020-03-04 MED ORDER — SULFAMETHOXAZOLE-TRIMETHOPRIM 800-160 MG PO TABS: 1.0000 | ORAL_TABLET | Freq: Two times a day (BID) | ORAL | Status: DC

## 2020-03-04 MED ORDER — LIDOCAINE HCL (PF) 1 % IJ SOLN
INTRAMUSCULAR | Status: DC | PRN
  Administered 2020-03-04: 2 mL via INTRADERMAL

## 2020-03-04 MED ORDER — AMLODIPINE BESYLATE 10 MG PO TABS: 10.0000 mg | ORAL_TABLET | Freq: Every day | ORAL | Status: DC

## 2020-03-04 MED ORDER — MIDAZOLAM HCL 2 MG/2ML IJ SOLN
INTRAMUSCULAR | Status: DC | PRN
  Administered 2020-03-04: 1 mg via INTRAVENOUS

## 2020-03-04 MED ORDER — VERAPAMIL HCL 2.5 MG/ML IV SOLN
INTRAVENOUS | Status: DC | PRN
  Administered 2020-03-04: 10 mL via INTRA_ARTERIAL

## 2020-03-04 MED ORDER — ONDANSETRON HCL 4 MG/2ML IJ SOLN: 4.0000 mg | Freq: Four times a day (QID) | INTRAMUSCULAR | Status: DC | PRN

## 2020-03-04 MED ORDER — TICAGRELOR 90 MG PO TABS
ORAL_TABLET | ORAL | Status: AC
  Filled 2020-03-04: qty 1

## 2020-03-04 SURGICAL SUPPLY — 21 items
BALLN SAPPHIRE 2.5X12 (BALLOONS) ×2
BALLN SAPPHIRE ~~LOC~~ 3.75X15 (BALLOONS) ×2 IMPLANT
BALLN SAPPHIRE ~~LOC~~ 4.0X15 (BALLOONS) ×2 IMPLANT
BALLOON SAPPHIRE 2.5X12 (BALLOONS) ×1 IMPLANT
CATH 5FR JL3.5 JR4 ANG PIG MP (CATHETERS) ×2 IMPLANT
CATH LAUNCHER 6FR EBU3.5 (CATHETERS) ×2 IMPLANT
CATH ULTRASOUND OPTICROSS 6FR (CATHETERS) ×2 IMPLANT
DEVICE RAD COMP TR BAND LRG (VASCULAR PRODUCTS) ×2 IMPLANT
GLIDESHEATH SLEND SS 6F .021 (SHEATH) ×2 IMPLANT
GUIDEWIRE INQWIRE 1.5J.035X260 (WIRE) ×1 IMPLANT
GUIDEWIRE PRESSURE COMET II (WIRE) ×2 IMPLANT
INQWIRE 1.5J .035X260CM (WIRE) ×2
KIT ENCORE 26 ADVANTAGE (KITS) ×2 IMPLANT
KIT ESSENTIALS PG (KITS) ×2 IMPLANT
KIT HEART LEFT (KITS) ×2 IMPLANT
PACK CARDIAC CATHETERIZATION (CUSTOM PROCEDURE TRAY) ×2 IMPLANT
SLED PULL BACK IVUS (MISCELLANEOUS) ×2 IMPLANT
STENT RESOLUTE ONYX 3.5X22 (Permanent Stent) ×2 IMPLANT
SYR MEDRAD MARK 7 150ML (SYRINGE) ×2 IMPLANT
TRANSDUCER W/STOPCOCK (MISCELLANEOUS) ×2 IMPLANT
TUBING CIL FLEX 10 FLL-RA (TUBING) ×2 IMPLANT

## 2020-03-04 NOTE — Discharge Summary (Signed)
Discharge Summary for Same Day PCI   Patient ID: Stephen Scott MRN: [id number]; DOB: [birth year]  Admit date: 03/04/2020 Discharge date: 03/04/2020  Primary Care Provider: Aliene Beams, MD  Primary Cardiologist: Peter Swaziland, MD   Discharge Diagnoses    Principal Problem:   Unstable angina [facility]) Active Problems:   HTN (hypertension)   Hypercholesterolemia    Diagnostic Studies/Procedures    Cardiac Catheterization 03/04/2020:  CORONARY STENT INTERVENTION  INTRAVASCULAR PRESSURE WIRE/FFR STUDY  Intravascular Ultrasound/IVUS  LEFT HEART CATH AND CORONARY ANGIOGRAPHY  Conclusion    Prox Cx to Mid Cx lesion is 80% stenosed.  Post intervention, there is a 0% residual stenosis.  A drug-eluting stent was successfully placed using a STENT RESOLUTE ONYX 3.5X22.  The left ventricular systolic function is normal.  LV end diastolic pressure is normal.  The left ventricular ejection fraction is 55-65% by visual estimate.   1. Single vessel obstructive CAD involving the LCx. 2. Normal LV function 3. Normal LVEDP 4. Successful PCI of the LCx using FFR and IVUS guidance with DES x 1.  Plan: anticipate same day DC. DAPT with ASA and Brilinta x 1 year.    Diagnostic Dominance: Right  Intervention      History of Present Illness     Stephen Scott is a 59 y.o. male with history of hypertension and hyperlipidemia who recently established care with Dr. Swaziland for evaluation of chest pain and dyspnea.  61-months history of intermittent burning in upper chest initially with exertion now at rest.  He had exertional shortness of breath and lack of energy.  1 week ago he had a worsening episode radiating chest pain to jaw bilaterally with associated sweating.  Strong family history of CAD.  Symptoms was concerning for unstable angina andc ardiac catheterization was arranged for further evaluation.  Hospital Course     The patient underwent cardiac cath as noted above with proximal  to mid left circumflex 80% stenosis s/p successful PCI with DES of L circumflex using FFR and IVUS. Marland Kitchen Plan for DAPT with ASA/Brillinta for at least 1 year. The patient was seen by cardiac rehab while in short stay. There were no observed complications post cath. Radial cath site was re-evaluated prior to discharge and found to be stable without any complications. Instructions/precautions regarding cath site care were given prior to discharge.  Stephen Scott was seen by Dr. Wallace Cullens and determined stable for discharge home. Follow up with our office has been arranged. Medications are listed below. Pertinent changes include addition of Brillinta.    Cath/PCI Registry Performance & Quality Measures: 1. Aspirin prescribed? - Yes 2. ADP Receptor Inhibitor (Plavix/Clopidogrel, Brilinta/Ticagrelor or Effient/Prasugrel) prescribed (includes medically managed patients)? - Yes 3. High Intensity Statin (Lipitor 40-80mg  or Crestor 20-40mg ) prescribed? - Yes 4. For EF <40%, was ACEI/ARB prescribed? - Yes 5. For EF <40%, Aldosterone Antagonist (Spironolactone or Eplerenone) prescribed? - Not Applicable (EF >/= 40%) 6. Cardiac Rehab Phase II ordered (Included Medically managed Patients)? - Yes  _____________   Discharge Vitals Blood pressure (!) 154/71, pulse 80, temperature 97.7 F (36.5 C), temperature source Skin, resp. rate (!) 0, height 5\' 11"  (1.803 m), weight 117 kg, SpO2 97 %.  Filed Weights   03/04/20 1028  Weight: 117 kg    Last Labs & Radiologic Studies    CBC Recent Labs    03/04/20 1050  WBC 9.3  HGB 15.8  HCT 47.4  MCV 87.9  PLT 277   Basic Metabolic Panel Recent Labs  03/04/20 1050  NA 137  K 4.5  CL 104  CO2 20*  GLUCOSE 173*  BUN 16  CREATININE 0.86  CALCIUM 9.4   _____________  CARDIAC CATHETERIZATION  Result Date: 03/04/2020  Prox Cx to Mid Cx lesion is 80% stenosed.  Post intervention, there is a 0% residual stenosis.  A drug-eluting stent was successfully  placed using a STENT RESOLUTE ONYX 3.5X22.  The left ventricular systolic function is normal.  LV end diastolic pressure is normal.  The left ventricular ejection fraction is 55-65% by visual estimate.  1. Single vessel obstructive CAD involving the LCx. 2. Normal LV function 3. Normal LVEDP 4. Successful PCI of the LCx using FFR and IVUS guidance with DES x 1. Plan: anticipate same day DC. DAPT with ASA and Brilinta x 1 year.    Disposition   Pt is being discharged home today in good condition.  Follow-up Plans & Appointments    Follow-up Information    Almyra Deforest, [place]. Go on 03/18/2020.   Specialties: Cardiology, Radiology Why: @11 :45am for hospital follow up  Contact information: [address] [place] [address] [phone]             Discharge Medications   Allergies as of 03/04/2020      Reactions   Iodides Anaphylaxis, Hives, Other (See Comments)   Immediate reaction, looks like chicken pox   Tetanus Toxoids Other (See Comments)   Swelling and tenderness in arm > Local reaction. Older version of tetanus shot, tolerates the newer version       Medication List    STOP taking these medications   acetaminophen 500 MG tablet Commonly known as: TYLENOL     TAKE these medications   amLODipine 10 MG tablet Commonly known as: NORVASC Take 10 mg by mouth daily.   aspirin EC 81 MG tablet Take 1 tablet (81 mg total) by mouth daily.   atorvastatin 80 MG tablet Commonly known as: LIPITOR Take 1 tablet (80 mg total) by mouth daily.   buPROPion 300 MG 24 hr tablet Commonly known as: WELLBUTRIN XL Take 300 mg by mouth daily.   diphenhydrAMINE 50 MG tablet Commonly known as: BENADRYL Take 50 mg before leaving home for cardiac cath   famotidine 20 MG tablet Commonly known as: PEPCID Take 20 mg by mouth at bedtime as needed for heartburn.   gabapentin 300 MG capsule Commonly known as: NEURONTIN Take 300 mg by mouth at bedtime.   loratadine 10  MG tablet Commonly known as: CLARITIN Take 10 mg by mouth daily as needed for allergies.   losartan 100 MG tablet Commonly known as: COZAAR Take 100 mg by mouth daily.   methocarbamol 500 MG tablet Commonly known as: ROBAXIN Take 500 mg by mouth every 8 (eight) hours as needed for muscle spasms.   nitroGLYCERIN 0.4 MG SL tablet Commonly known as: NITROSTAT Place 1 tablet (0.4 mg total) under the tongue every 5 (five) minutes as needed for chest pain.   oxymetazoline 0.05 % nasal spray Commonly known as: AFRIN Place 1 spray into both nostrils 2 (two) times daily as needed for congestion.   predniSONE 50 MG tablet Commonly known as: DELTASONE Take 50 mg 13 hours before cath and 50 mg 7 hours before cath and 50 mg before going to hospital   sertraline 100 MG tablet Commonly known as: ZOLOFT Take 100 mg by mouth daily.   sulfamethoxazole-trimethoprim 800-160 MG tablet Commonly known as: BACTRIM DS Take 1 tablet by  mouth 2 (two) times daily. For 10 days   SYSTANE OP Place 1 drop into both eyes daily as needed (dry eyes).   ticagrelor 90 MG Tabs tablet Commonly known as: BRILINTA Take 1 tablet (90 mg total) by mouth 2 (two) times daily.          Allergies Allergies  Allergen Reactions  . Iodides Anaphylaxis, Hives and Other (See Comments)    Immediate reaction, looks like chicken pox  . Tetanus Toxoids Other (See Comments)    Swelling and tenderness in arm > Local reaction. Older version of tetanus shot, tolerates the newer version     Outstanding Labs/Studies   None  Duration of Discharge Encounter   Greater than 30 minutes including physician time.  Lorelei Pont, PA 03/04/2020, 2:38 PM

## 2020-03-04 NOTE — Progress Notes (Signed)
CARDIAC REHAB PHASE I   Stent education completed with pt. Pt educated on importance of ASA, Brilinta, and NTG. Pt given stent card and heart healthy diet. Reviewed restrictions, site care, and exercise guidelines. Pt states many stressors, talked with pt at length about importance of stress management. Will refer to CRP II [place].  [id number] Reynold Bowen, RN BSN 03/04/2020 3:23 PM

## 2020-03-04 NOTE — Progress Notes (Signed)
Client states continues to have jaw pain 4/10 and left arm pain 4/10; states jaw and arm pain were 9/10 in cath lab; Janell Quiet notified and orders noted

## 2020-03-04 NOTE — Discharge Instructions (Signed)
Coronary Angiogram With Stent, Care After This sheet gives you information about how to care for yourself after your procedure. Your health care provider may also give you more specific instructions. If you have problems or questions, contact your health care provider. What can I expect after the procedure? After the procedure, it is common to have:  Bruising and tenderness at the insertion site. This usually fades within 1-2 weeks.  A collection of blood under the skin (hematoma). This usually decreases within 1-2 weeks. Follow these instructions at home: Medicines  Take over-the-counter and prescription medicines only as told by your health care provider.  If you were prescribed an antibiotic medicine, take it as told by your health care provider. Do not stop using the antibiotic even if you start to feel better.  If you take medicines for diabetes, your health care provider may need to change how much you take. Ask your health care provider for specific directions about taking your diabetes medicines.  If you are taking blood thinners: ? Talk with your health care provider before you take any medicines that contain aspirin or NSAIDs, such as ibuprofen. These medicines increase your risk for dangerous bleeding. ? Take your medicine exactly as told, at the same time every day. ? Avoid activities that could cause injury or bruising, and follow instructions about how to prevent falls. ? Wear a medical alert bracelet or carry a card that lists what medicines you take. Eating and drinking   Follow instructions from your health care provider about eating or drinking restrictions.  Eat a heart-healthy diet that includes plenty of fresh fruits and vegetables.  Avoid foods that are high in salt, sugar, or saturated fat. Avoid fried foods or canned or highly processed food.  Drink enough fluid to keep your urine pale yellow. Alcohol use  Do not drink alcohol if: ? Your health care provider  tells you not to. ? You are pregnant, may be pregnant, or plan to become pregnant.  If you drink alcohol: ? Limit how much you use to:  0-1 drink a day for women.  0-2 drinks a day for men. ? Be aware of how much alcohol is in your drink. In the [place] one drink equals one 12 oz bottle of beer (355 mL), one 5 oz glass of wine (148 mL), or one 1 oz glass of hard liquor (44 mL). Bathing  Do not take baths, swim, or use a hot tub until your health care provider approves. Ask your health care provider if you may take showers. You may only be allowed to take sponge baths.  Gently wash the insertion site with plain soap and water.  Pat the area dry with a clean towel. Do not rub. This may cause bleeding. Incision care  Follow instructions from your health care provider about how to take care of your insertion area. Make sure you: ? Wash your hands with soap and water before and after you change your bandage (dressing). If soap and water are not available, use hand sanitizer. ? Change your dressing as told by your health care provider. ? Leave stitches (sutures) or adhesive strips in place. These skin closures may need to stay in place for 2 weeks or longer. If adhesive strip edges start to loosen and curl up, you may trim the loose edges. Do not remove adhesive strips completely unless your health care provider tells you to do that.  Do not apply powder or lotion on the insertion area.  Check   your insertion area every day for signs of infection. Check for: ? Redness, swelling, or pain. ? Fluid or blood. ? Warmth. ? Pus or a bad smell. Activity  Do not drive for 24 hours if you were given a sedative during your procedure.  Rest as told by your health care provider. ? Avoid sitting for a long time without moving. Get up to take short walks every 1-2 hours. This is important to improve blood flow and breathing. Ask for help if you feel weak or unsteady.  Do not lift anything that is  heavier than 10 lb (4.5 kg), or the limit that you are told, until your health care provider says that it is safe.  Return to your normal activities as told by your health care provider. Ask your health care provider what activities are safe for you. Lifestyle   Do not use any products that contain nicotine or tobacco, such as cigarettes, e-cigarettes, and chewing tobacco. If you need help quitting, ask your health care provider.  If needed, work with your health care provider to treat other problems, such as being overweight, or having high blood pressure or diabetes.  Get regular exercise. Do exercises as told by your health care provider. General instructions  Tell all your health care providers that you have a stent. This is especially important if you are going to get imaging studies, such as MRI.  Wear compression stockings as told by your health care provider. These stockings help to prevent blood clots and reduce swelling in your legs.  Do not strain during a bowel movement if the procedure was done through your leg. Straining may cause bleeding from the insertion site.  Keep all follow-up visits as directed by your health care provider. This is important. Contact a health care provider if you:  Have a fever.  Have chills.  Have redness, swelling, or pain around your insertion area.  Have fluid or blood (other than a little blood on the dressing) coming from your insertion area.  Notice that your insertion area feels warm to the touch.  Have pus or a bad smell coming from your insertion area.  Have more bleeding from the insertion area. Hold pressure on the area. Get help right away if:  You develop chest pain or shortness of breath.  You feel like fainting or you faint.  Your leg or arm becomes cool, numb, or tingly.  You have unusual pain.  Your insertion area is bleeding, and bleeding continues after 30 minutes of steadily held pressure.  You develop bleeding  anywhere else, including from your rectum. There may be bright red blood in your urine or stool, or you may have black, tarry stool. These symptoms may represent a serious problem that is an emergency. Do not wait to see if the symptoms will go away. Get medical help right away. Call your local emergency services (911 in the [place] Do not drive yourself to the hospital. Summary  After this procedure, it is common to have bruising and tenderness around the catheter insertion site. This will go away in a few weeks.  Follow your health care provider's instructions about caring for your insertion site. Change dressing and clean the area as instructed.  Eat a heart-healthy diet. Limit alcohol use. Do not use tobacco or nicotine.  Contact a health care provider if you have fever or chills, or if you have pus or a bad smell coming from the site.  Get help right away if you   develop chest pain, you faint, or have bleeding at the insertion site. This information is not intended to replace advice given to you by your health care provider. Make sure you discuss any questions you have with your health care provider. Document Revised: 04/04/2019 Document Reviewed: 04/04/2019 Elsevier Patient Education  2020 Else[employer] Inc. Radial Site Care  This sheet gives you information about how to care for yourself after your procedure. Your health care provider may also give you more specific instructions. If you have problems or questions, contact your health care provider. What can I expect after the procedure? After the procedure, it is common to have:  Bruising and tenderness at the catheter insertion area. Follow these instructions at home: Medicines  Take over-the-counter and prescription medicines only as told by your health care provider. Insertion site care  Follow instructions from your health care provider about how to take care of your insertion site. Make sure you: ? Wash your hands with soap and  water before you change your bandage (dressing). If soap and water are not available, use hand sanitizer. ? Change your dressing as told by your health care provider. ? Leave stitches (sutures), skin glue, or adhesive strips in place. These skin closures may need to stay in place for 2 weeks or longer. If adhesive strip edges start to loosen and curl up, you may trim the loose edges. Do not remove adhesive strips completely unless your health care provider tells you to do that.  Check your insertion site every day for signs of infection. Check for: ? Redness, swelling, or pain. ? Fluid or blood. ? Pus or a bad smell. ? Warmth.  Do not take baths, swim, or use a hot tub until your health care provider approves.  You may shower 24-48 hours after the procedure, or as directed by your health care provider. ? Remove the dressing and gently wash the site with plain soap and water. ? Pat the area dry with a clean towel. ? Do not rub the site. That could cause bleeding.  Do not apply powder or lotion to the site. Activity   For 24 hours after the procedure, or as directed by your health care provider: ? Do not flex or bend the affected arm. ? Do not push or pull heavy objects with the affected arm. ? Do not drive yourself home from the hospital or clinic. You may drive 24 hours after the procedure unless your health care provider tells you not to. ? Do not operate machinery or power tools.  Do not lift anything that is heavier than 10 lb (4.5 kg), or the limit that you are told, until your health care provider says that it is safe.  Ask your health care provider when it is okay to: ? Return to work or school. ? Resume usual physical activities or sports. ? Resume sexual activity. General instructions  If the catheter site starts to bleed, raise your arm and put firm pressure on the site. If the bleeding does not stop, get help right away. This is a medical emergency.  If you went home on  the same day as your procedure, a responsible adult should be with you for the first 24 hours after you arrive home.  Keep all follow-up visits as told by your health care provider. This is important. Contact a health care provider if:  You have a fever.  You have redness, swelling, or yellow drainage around your insertion site. Get help right away if:    You have unusual pain at the radial site.  The catheter insertion area swells very fast.  The insertion area is bleeding, and the bleeding does not stop when you hold steady pressure on the area.  Your arm or hand becomes pale, cool, tingly, or numb. These symptoms may represent a serious problem that is an emergency. Do not wait to see if the symptoms will go away. Get medical help right away. Call your local emergency services (911 in the [place] Do not drive yourself to the hospital. Summary  After the procedure, it is common to have bruising and tenderness at the site.  Follow instructions from your health care provider about how to take care of your radial site wound. Check the wound every day for signs of infection.  Do not lift anything that is heavier than 10 lb (4.5 kg), or the limit that you are told, until your health care provider says that it is safe. This information is not intended to replace advice given to you by your health care provider. Make sure you discuss any questions you have with your health care provider. Document Revised: 10/19/2017 Document Reviewed: 10/19/2017 Elsevier Patient Education  2020 Elsevier Inc.  

## 2020-03-04 NOTE — Progress Notes (Signed)
Prednisone 50mg  at 11pm 6/7; at 5am 6/8 and at 10am 6/8; took benadryl 50mg  at 10am

## 2020-03-04 NOTE — Interval H&P Note (Signed)
History and Physical Interval Note:  03/04/2020 11:58 AM  Stephen Scott  has presented today for surgery, with the diagnosis of unstable angina.  The various methods of treatment have been discussed with the patient and family. After consideration of risks, benefits and other options for treatment, the patient has consented to  Procedure(s): LEFT HEART CATH AND CORONARY ANGIOGRAPHY (N/A) as a surgical intervention.  The patient's history has been reviewed, patient examined, no change in status, stable for surgery.  I have reviewed the patient's chart and labs.  Questions were answered to the patient's satisfaction.    Cath Lab Visit (complete for each Cath Lab visit)  Clinical Evaluation Leading to the Procedure:   ACS: Yes.    Non-ACS:    Anginal Classification: CCS III  Anti-ischemic medical therapy: Minimal Therapy (1 class of medications)  Non-Invasive Test Results: No non-invasive testing performed  Prior CABG: No previous CABG       [facility] 03/04/2020 11:58 AM

## 2020-03-05 ENCOUNTER — Telehealth: Payer: Self-pay | Admitting: Cardiology

## 2020-03-05 MED FILL — Ticagrelor Tab 90 MG: ORAL | Qty: 1 | Status: AC

## 2020-03-05 NOTE — Telephone Encounter (Signed)
Left message for pt to call.

## 2020-03-05 NOTE — Telephone Encounter (Signed)
New Message:   Pt had Cath yesterday, he have some questions. He wants to know when can he go back to work and his restrictions?

## 2020-03-06 ENCOUNTER — Telehealth (HOSPITAL_COMMUNITY): Payer: Self-pay

## 2020-03-06 NOTE — Telephone Encounter (Signed)
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 Patient states he is ok to stay home for however long Dr. Swaziland prefers.

## 2020-03-06 NOTE — Telephone Encounter (Signed)
Called patient to see if he is interested in the Cardiac Rehab Program. Patient expressed interest. Explained scheduling process and went over insurance, patient verbalized understanding. Will contact patient for scheduling once f/u has been completed.  °

## 2020-03-06 NOTE — Telephone Encounter (Signed)
Spoke with patient. Informed patient Dr. Swaziland is out of the office today. Patient is going to hold off on returning to work tomorrow until he hears from Dr. Swaziland.

## 2020-03-06 NOTE — Telephone Encounter (Signed)
Pt insurance is active and benefits verified through [facility]. Co-pay $0.00, DED $5,000.00/$723.68 met, out of pocket $6,500.00/$723.68 met, co-insurance 30%. No pre-authorization required. Passport, 03/06/20 @ 3:07PM, KTG#[id number]  Will contact patient to see if he is interested in the Cardiac Rehab Program. If interested, patient will need to complete follow up appt. Once completed, patient will be contacted for scheduling upon review by the RN Navigator.

## 2020-03-06 NOTE — Telephone Encounter (Signed)
Patient returned call

## 2020-03-06 NOTE — Telephone Encounter (Signed)
Pt insurance is active and benefits verified through [facility]. Co-pay $0.00, DED $5,000.00/$723.68 met, out of pocket $6,500.00/$723.68 met, co-insurance 0%. No pre-authorization required. Passport, 03/06/20 @ 3:07PM, AJH#[id number]  Will contact patient to see if he is interested in the Cardiac Rehab Program. If interested, patient will need to complete follow up appt. Once completed, patient will be contacted for scheduling upon review by the RN Navigator.

## 2020-03-06 NOTE — Telephone Encounter (Signed)
Pt called to ask when he can return to work after having his Stent with Dr. Swaziland 03/04/20.. 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 he can wait until nest week if Dr. Swaziland prefers.   He says he is feeling well but just very tired and has had some GI upset and diarrhea and asking if that could be from the  Brilinta.    He moved his Follow up to 03/25/20 since thy are planning to go to their beach house for some time.   I advised him to be sure he is drinking enough fluids.   Will forward to Dr. Swaziland for review.

## 2020-03-07 NOTE — Telephone Encounter (Signed)
He may return to work tomorrow 6/12 if no lifting. May resume lifting next week.  Marnee Sherrard Swaziland MD, [facility]

## 2020-03-07 NOTE — Telephone Encounter (Signed)
I advised patient of Dr. Elvis Coil message below.  He verbalized understanding.  He had no further questions.

## 2020-03-07 NOTE — Telephone Encounter (Signed)
LMTCB

## 2020-03-18 ENCOUNTER — Ambulatory Visit: Payer: 59 | Admitting: Physician Assistant

## 2020-03-25 ENCOUNTER — Encounter: Payer: Self-pay | Admitting: Cardiology

## 2020-03-25 ENCOUNTER — Ambulatory Visit (INDEPENDENT_AMBULATORY_CARE_PROVIDER_SITE_OTHER): Payer: 59 | Admitting: Cardiology

## 2020-03-25 ENCOUNTER — Other Ambulatory Visit: Payer: Self-pay

## 2020-03-25 VITALS — BP 138/52 | HR 71 | Ht 71.0 in | Wt 248.0 lb

## 2020-03-25 DIAGNOSIS — Z8249 Family history of ischemic heart disease and other diseases of the circulatory system: Secondary | ICD-10-CM | POA: Diagnosis not present

## 2020-03-25 DIAGNOSIS — E785 Hyperlipidemia, unspecified: Secondary | ICD-10-CM

## 2020-03-25 DIAGNOSIS — I2510 Atherosclerotic heart disease of native coronary artery without angina pectoris: Secondary | ICD-10-CM | POA: Diagnosis not present

## 2020-03-25 DIAGNOSIS — I10 Essential (primary) hypertension: Secondary | ICD-10-CM | POA: Diagnosis not present

## 2020-03-25 DIAGNOSIS — Z9861 Coronary angioplasty status: Secondary | ICD-10-CM

## 2020-03-25 NOTE — Assessment & Plan Note (Signed)
Discharged on high dose statin Rx 

## 2020-03-25 NOTE — Assessment & Plan Note (Signed)
Controlled.  

## 2020-03-25 NOTE — Progress Notes (Signed)
Cardiology Office Note:    Date:  03/25/2020   ID:  Stephen Scott, DOB 24-May-1961, MRN [id number]  PCP:  Aliene Beams, MD  Cardiologist:  Peter Swaziland, MD  Electrophysiologist:  None   Referring MD: Aliene Beams, MD   CC:  Post PCI follow up  History of Present Illness:    Stephen Scott is a 59 y.o. male with a hx of HTN and HL who saw Dr Swaziland 02/29/2020 for evaluation of progressive chest pain concerning for angina.  Cath done 02/29/2020 showed significant mCFX stenosis which was treated with PCI/DES.  He had no other significant CAD and had normal LVF.  He is seen today in follow up.  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  The patient tells me he has been careful about his diet and has started walking for exercise.  He denies any chest pain or exertional symptoms similar to his pre PCI symptoms.   Past Medical History:  Diagnosis Date  . Anemia    as a small child  . Anxiety   . Arthritis    "lower spine; knees" (11/01/2017)  . Chronic lower back pain   . Depression   . Family history of adverse reaction to anesthesia    "daughter PONV"  . GERD (gastroesophageal reflux disease)   . High cholesterol   . History of kidney stones   . Hypertension   . Pneumonia 06/2017; 08/2017   walking pneumonia; treated  . Spinal stenosis    "lower back" (11/01/2017)    Past Surgical History:  Procedure Laterality Date  . CORONARY STENT INTERVENTION N/A 03/04/2020   Procedure: CORONARY STENT INTERVENTION;  Surgeon: Swaziland, Peter M, MD;  Location: [facility] INVASIVE CV LAB;  Service: Cardiovascular;  Laterality: N/A;  . HAND RECONSTRUCTION Left 1989   "injured when washing dishes"  . INTRAVASCULAR PRESSURE WIRE/FFR STUDY N/A 03/04/2020   Procedure: INTRAVASCULAR PRESSURE WIRE/FFR STUDY;  Surgeon: Swaziland, Peter M, MD;  Location: [facility] INVASIVE CV LAB;  Service: Cardiovascular;  Laterality: N/A;  . INTRAVASCULAR ULTRASOUND/IVUS N/A 03/04/2020   Procedure:  Intravascular Ultrasound/IVUS;  Surgeon: Swaziland, Peter M, MD;  Location: [facility] INVASIVE CV LAB;  Service: Cardiovascular;  Laterality: N/A;  . JOINT REPLACEMENT    . KNEE ARTHROSCOPY Right 2008  . LEFT HEART CATH AND CORONARY ANGIOGRAPHY N/A 03/04/2020   Procedure: LEFT HEART CATH AND CORONARY ANGIOGRAPHY;  Surgeon: Swaziland, Peter M, MD;  Location: [facility] INVASIVE CV LAB;  Service: Cardiovascular;  Laterality: N/A;  . TOTAL KNEE ARTHROPLASTY Right 11/01/2017  . TOTAL KNEE ARTHROPLASTY Right 11/01/2017   Procedure: RIGHT TOTAL KNEE ARTHROPLASTY;  Surgeon: Kathryne Hitch, MD;  Location: [facility] OR;  Service: Orthopedics;  Laterality: Right;  . WISDOM TOOTH EXTRACTION  1981    Current Medications: Current Meds  Medication Sig  . amLODipine (NORVASC) 10 MG tablet Take 10 mg by mouth daily.  Marland Kitchen aspirin EC 81 MG tablet Take 1 tablet (81 mg total) by mouth daily.  Marland Kitchen atorvastatin (LIPITOR) 80 MG tablet Take 1 tablet (80 mg total) by mouth daily.  Marland Kitchen buPROPion (WELLBUTRIN XL) 300 MG 24 hr tablet Take 300 mg by mouth daily.   . famotidine (PEPCID) 20 MG tablet Take 20 mg by mouth at bedtime as needed for heartburn.   . gabapentin (NEURONTIN) 300 MG capsule Take 300 mg by mouth at bedtime.  Marland Kitchen loratadine (CLARITIN) 10 MG tablet Take 10 mg by mouth daily as needed for allergies.  Marland Kitchen losartan (COZAAR)  100 MG tablet Take 100 mg by mouth daily.  . methocarbamol (ROBAXIN) 500 MG tablet Take 500 mg by mouth every 8 (eight) hours as needed for muscle spasms.  . nitroGLYCERIN (NITROSTAT) 0.4 MG SL tablet Place 1 tablet (0.4 mg total) under the tongue every 5 (five) minutes as needed for chest pain.  Bertram Gala Glycol-Propyl Glycol (SYSTANE OP) Place 1 drop into both eyes daily as needed (dry eyes).  . sertraline (ZOLOFT) 100 MG tablet Take 100 mg by mouth daily.  . ticagrelor (BRILINTA) 90 MG TABS tablet Take 1 tablet (90 mg total) by mouth 2 (two) times daily.  . [DISCONTINUED] diphenhydrAMINE (BENADRYL) 50 MG tablet  Take 50 mg before leaving home for cardiac cath  . [DISCONTINUED] oxymetazoline (AFRIN) 0.05 % nasal spray Place 1 spray into both nostrils 2 (two) times daily as needed for congestion.  . [DISCONTINUED] predniSONE (DELTASONE) 50 MG tablet Take 50 mg 13 hours before cath and 50 mg 7 hours before cath and 50 mg before going to hospital  . [DISCONTINUED] sulfamethoxazole-trimethoprim (BACTRIM DS) 800-160 MG tablet Take 1 tablet by mouth 2 (two) times daily. For 10 days   Current Facility-Administered Medications for the 03/25/20 encounter (Office Visit) with Abelino Derrick, PA-C  Medication  . sodium chloride flush (NS) 0.9 % injection 3 mL     Allergies:   Iodides and Tetanus toxoids   Social History   Socioeconomic History  . Marital status: Married    Spouse name: Not on file  . Number of children: 2  . Years of education: Not on file  . Highest education level: Not on file  Occupational History    Employer: [employer]  Tobacco Use  . Smoking status: Never Smoker  . Smokeless tobacco: Never Used  Vaping Use  . Vaping Use: Never used  Substance and Sexual Activity  . Alcohol use: Yes    Alcohol/week: 3.0 standard drinks    Types: 3 Glasses of wine per week  . Drug use: No  . Sexual activity: Not on file  Other Topics Concern  . Not on file  Social History Narrative  . Not on file   Social Determinants of Health   Financial Resource Strain:   . Difficulty of Paying Living Expenses:   Food Insecurity:   . Worried About Programme researcher, broadcasting/film/video in the Last Year:   . Barista in the Last Year:   Transportation Needs:   . Freight forwarder (Medical):   Marland Kitchen Lack of Transportation (Non-Medical):   Physical Activity:   . Days of Exercise per Week:   . Minutes of Exercise per Session:   Stress:   . Feeling of Stress :   Social Connections:   . Frequency of Communication with Friends and Family:   . Frequency of Social Gatherings with Friends and  Family:   . Attends Religious Services:   . Active Member of Clubs or Organizations:   . Attends Banker Meetings:   Marland Kitchen Marital Status:      Family History: The patient's family history includes Clotting disorder in his mother; Diabetes in his mother; Heart attack in his brother; Heart disease in his brother; Hyperlipidemia in his mother; Hypertension in his mother; Pulmonary embolism in his mother.  ROS:   Please see the history of present illness.     All other systems reviewed and are negative.  EKGs/Labs/Other Studies Reviewed:    The following studies  were reviewed today: Cath/PCI 03/04/2020  EKG:  EKG is ordered today.  The ekg ordered today demonstrates NSR- HR 66  Recent Labs: 03/04/2020: BUN 16; Creatinine, Ser 0.86; Hemoglobin 15.8; Platelets 277; Potassium 4.5; Sodium 137  Recent Lipid Panel No results found for: CHOL, TRIG, HDL, CHOLHDL, VLDL, LDLCALC, LDLDIRECT  Physical Exam:    VS:  BP (!) 138/52   Ht 5\' 11"  (1.803 m)   Wt 248 lb (112.5 kg)   BMI 34.59 kg/m     Wt Readings from Last 3 Encounters:  03/25/20 248 lb (112.5 kg)  03/04/20 258 lb (117 kg)  02/29/20 263 lb (119.3 kg)     GEN: Overweight male, well developed in no acute distress HEENT: Normal NECK: No JVD; No carotid bruits CARDIAC: RRR, no murmurs, rubs, gallops RESPIRATORY:  Clear to auscultation without rales, wheezing or rhonchi  ABDOMEN: Soft, non-tender, non-distended MUSCULOSKELETAL:  No edema; No deformity  SKIN: Warm and dry NEUROLOGIC:  Alert and oriented x 3 PSYCHIATRIC:  Normal affect   ASSESSMENT:    CAD S/P percutaneous coronary angioplasty S/P CFX PCI/DES 03/04/2020. No other CAD, normal LVF  Dyslipidemia, goal LDL below 70 Discharged on high dose statin Rx  Essential hypertension Controlled  Family history of coronary artery disease in brother One brother with a history of MI, one brother deceased from MI  PLAN:    He is not on a beta blocker but he  didn't have an MI and his HR is in the 74's- will not add now.  Check CMET, fasting lipids 2 months- Dr 61's 3 months.    Medication Adjustments/Labs and Tests Ordered: Current medicines are reviewed at length with the patient today.  Concerns regarding medicines are outlined above.  No orders of the defined types were placed in this encounter.  No orders of the defined types were placed in this encounter.   There are no Patient Instructions on file for this visit.   [place], PA-C  03/25/2020 3:06 PM    [facility]

## 2020-03-25 NOTE — Assessment & Plan Note (Signed)
S/P CFX PCI/DES 03/04/2020. No other CAD, normal LVF

## 2020-03-25 NOTE — Assessment & Plan Note (Signed)
One brother with a history of MI, one brother deceased from MI

## 2020-03-25 NOTE — Patient Instructions (Signed)
Medication Instructions:  Your physician recommends that you continue on your current medications as directed. Please refer to the Current Medication list given to you today.  *If you need a refill on your cardiac medications before your next appointment, please call your pharmacy*   Lab Work: Your physician recommends that you return for a FASTING lipid profile and CMET in 2 months (August). Nothing to eat or dink after midnight the night before. You do not need an appointment to have blood work done in our office. Please bring your lab slips with you when you return.  If you have labs (blood work) drawn today and your tests are completely normal, you will receive your results only by: Marland Kitchen MyChart Message (if you have MyChart) OR . A paper copy in the mail If you have any lab test that is abnormal or we need to change your treatment, we will call you to review the results.   Follow-Up: At [facility], you and your health needs are our priority.  As part of our continuing mission to provide you with exceptional heart care, we have created designated Provider Care Teams.  These Care Teams include your primary Cardiologist (physician) and Advanced Practice Providers (APPs -  Physician Assistants and Nurse Practitioners) who all work together to provide you with the care you need, when you need it.  We recommend signing up for the patient portal called "MyChart".  Sign up information is provided on this After Visit Summary.  MyChart is used to connect with patients for Virtual Visits (Telemedicine).  Patients are able to view lab/test results, encounter notes, upcoming appointments, etc.  Non-urgent messages can be sent to your provider as well.   To learn more about what you can do with MyChart, go to [email or web address].    Your next appointment:   3 month(s)  The format for your next appointment:   In Person  Provider:   Peter Swaziland, MD (ONLY)

## 2020-03-26 ENCOUNTER — Encounter (HOSPITAL_COMMUNITY): Payer: Self-pay

## 2020-03-26 NOTE — Progress Notes (Signed)
Cardiac Rehab Note:  Clinical review of pt follow up appt on 03/25/20 with Corine Shelter - cardiologist office note. Pt is making the expected progress in recovery.  Pt appropriate for scheduling for on site cardiac rehab and/or enrollment in Virtual Cardiac Rehab.  Pt Covid score is 5.  Will forward to staff for follow up.   Maniah Nading E. Suzie Portela RN, BSN [place]. [facility]  Cardiac and Pulmonary Rehabilitation Phone: [phone] Fax: [phone]

## 2020-03-27 ENCOUNTER — Telehealth (HOSPITAL_COMMUNITY): Payer: Self-pay

## 2020-03-27 NOTE — Telephone Encounter (Signed)
Called and spoke with pt in regards to CR, pt stated he is moving things and will call back.  Placed pt ppw back in bin (ready to sch.), if pt does not f/u back up will f/u back up with pt in a month.

## 2020-04-04 MED FILL — BRILINTA 90 MG TABLET: 90 | 30 days supply | Qty: 60 | Fill #0

## 2020-05-01 ENCOUNTER — Telehealth (HOSPITAL_COMMUNITY): Payer: Self-pay

## 2020-05-08 NOTE — Telephone Encounter (Signed)
Cardiac Rehab Medication Review by a Pharmacist  Does the patient  feel that his/her medications are working for him/her?  Yes  Has the patient been experiencing any side effects to the medications prescribed?  Yes - the patient states that since leaving the hospital, they will occasionally feel some dizziness that lasts 4-5 minutes then goes away. The patient reports that the dizziness occurs when he turns around too quickly but denies any falls. The patient reports being more careful when standing up or turning around.  Does the patient measure his/her own blood pressure or blood glucose at home?  Yes - the patient's average blood pressure readings are around 112-124/60-80 with a pulse of 60-70. The patient does not take their blood glucose at home.  Does the patient have any problems obtaining medications due to transportation or finances?   Yes - the patient has trouble obtaining their ticagrelor due to finances. Initially, the medication was ~$500 but after working with the pharmacy, it is now ~$75. The patient is still filling the medication but states that it is very difficult to continue paying for such an expensive medication.   Understanding of regimen: good Understanding of indications: good Potential of compliance: fair  Pharmacist Intervention: The patient mentioned that in the past 2 months, they have missed <5 doses of their evening ticagrelor dose. Upon questioning, I learned that the patient does not use a pill box. I recommended that the patient utilize a pill box and keep it somewhere that is part of their daily routine. I also counseled the patient on using alarms and various other strategies to increase medication compliance. We also discussed the importance of ticagrelor and what the medication is used for. The patient was agreeable to using a pill box and voiced understanding of our conversation.  Sanda Klein, PharmD, RPh  PGY-1 Pharmacy Resident 05/08/2020 10:27  AM  Please check [email or web address] for unit-specific pharmacy phone numbers.

## 2020-05-09 ENCOUNTER — Telehealth (HOSPITAL_COMMUNITY): Payer: Self-pay

## 2020-05-09 NOTE — Telephone Encounter (Signed)
Cardiac Rehab Telephone Note:  Successful telephone encounter to Stephen Scott to confirm Cardiac Rehab orientation appointment for 05/13/20 at 7:30am. Nursing assessment not completed secondary to time constraints and patient at work. Will complete health assessment during orientation. Patient questions answered. Instructions for appointment provided. Patient screening for Covid-19 negative.  Stephen Scott E. Suzie Portela RN, BSN [place]. [facility]  Cardiac and Pulmonary Rehabilitation Phone: [phone] Fax: [phone]

## 2020-05-12 MED FILL — BRILINTA 90 MG TABLET: 90 | 30 days supply | Qty: 60 | Fill #1

## 2020-05-13 ENCOUNTER — Encounter (HOSPITAL_COMMUNITY): Payer: Self-pay

## 2020-05-13 ENCOUNTER — Other Ambulatory Visit: Payer: Self-pay

## 2020-05-13 ENCOUNTER — Encounter (HOSPITAL_COMMUNITY)
Admission: RE | Admit: 2020-05-13 | Discharge: 2020-05-13 | Disposition: A | Discharge status: Home / Self Care | Payer: 59 | Source: Ambulatory Visit | Attending: Cardiology | Admitting: Cardiology

## 2020-05-13 VITALS — BP 142/70 | HR 68 | Resp 18 | Ht 70.5 in | Wt 240.3 lb

## 2020-05-13 DIAGNOSIS — Z9861 Coronary angioplasty status: Secondary | ICD-10-CM | POA: Insufficient documentation

## 2020-05-13 DIAGNOSIS — I2510 Atherosclerotic heart disease of native coronary artery without angina pectoris: Secondary | ICD-10-CM

## 2020-05-13 DIAGNOSIS — Z955 Presence of coronary angioplasty implant and graft: Secondary | ICD-10-CM | POA: Insufficient documentation

## 2020-05-13 NOTE — Progress Notes (Signed)
Cardiac Individual Treatment Plan  Patient Details  Name: Stephen Scott MRN: [id number] Date of Birth: 06-02-1961 Referring Provider:     CARDIAC REHAB PHASE II ORIENTATION from 05/13/2020 in [facility]  Referring Provider [place], Peter M, MD.      Initial Encounter Date:    CARDIAC REHAB PHASE II ORIENTATION from 05/13/2020 in [facility] CARDIAC REHAB  Date 05/13/20      Visit Diagnosis: CAD S/P percutaneous coronary angioplasty  Status post coronary artery stent placement  Patient's Home Medications on Admission:  Current Outpatient Medications:  .  amLODipine (NORVASC) 10 MG tablet, Take 10 mg by mouth daily., Disp: , Rfl:  .  aspirin EC 81 MG tablet, Take 1 tablet (81 mg total) by mouth daily., Disp: 90 tablet, Rfl: 3 .  atorvastatin (LIPITOR) 80 MG tablet, Take 1 tablet (80 mg total) by mouth daily., Disp: 90 tablet, Rfl: 3 .  buPROPion (WELLBUTRIN XL) 300 MG 24 hr tablet, Take 300 mg by mouth daily. , Disp: , Rfl:  .  gabapentin (NEURONTIN) 300 MG capsule, Take 300 mg by mouth at bedtime., Disp: , Rfl:  .  losartan (COZAAR) 100 MG tablet, Take 100 mg by mouth daily., Disp: , Rfl:  .  Polyethyl Glycol-Propyl Glycol (SYSTANE OP), Place 1 drop into both eyes daily as needed (dry eyes)., Disp: , Rfl:  .  sertraline (ZOLOFT) 100 MG tablet, Take 100 mg by mouth daily., Disp: , Rfl:  .  ticagrelor (BRILINTA) 90 MG TABS tablet, Take 1 tablet (90 mg total) by mouth 2 (two) times daily., Disp: 60 tablet, Rfl: 11 .  famotidine (PEPCID) 20 MG tablet, Take 20 mg by mouth at bedtime as needed for heartburn.  (Patient not taking: Reported on 05/13/2020), Disp: , Rfl:  .  loratadine (CLARITIN) 10 MG tablet, Take 10 mg by mouth daily as needed for allergies. (Patient not taking: Reported on 05/13/2020), Disp: , Rfl:  .  methocarbamol (ROBAXIN) 500 MG tablet, Take 500 mg by mouth every 8 (eight) hours as needed for muscle spasms. (Patient not  taking: Reported on 05/13/2020), Disp: , Rfl:  .  nitroGLYCERIN (NITROSTAT) 0.4 MG SL tablet, Place 1 tablet (0.4 mg total) under the tongue every 5 (five) minutes as needed for chest pain. (Patient not taking: Reported on 05/13/2020), Disp: 90 tablet, Rfl: 3  Past Medical History: Past Medical History:  Diagnosis Date  . Anemia    as a small child  . Anxiety   . Arthritis    "lower spine; knees" (11/01/2017)  . Chronic lower back pain   . Depression   . Family history of adverse reaction to anesthesia    "daughter PONV"  . GERD (gastroesophageal reflux disease)   . High cholesterol   . History of kidney stones   . Hypertension   . Pneumonia 06/2017; 08/2017   walking pneumonia; treated  . Spinal stenosis    "lower back" (11/01/2017)    Tobacco Use: Social History   Tobacco Use  Smoking Status Never Smoker  Smokeless Tobacco Never Used    Labs: Recent Review Flowsheet Data   There is no flowsheet data to display.     Capillary Blood Glucose: No results found for: GLUCAP   Exercise Target Goals: Exercise Program Goal: Individual exercise prescription set using results from initial 6 min walk test and THRR while considering  patient's activity barriers and safety.   Exercise Prescription Goal: Initial exercise prescription builds to 30-45 minutes  a day of aerobic activity, 2-3 days per week.  Home exercise guidelines will be given to patient during program as part of exercise prescription that the participant will acknowledge.  Activity Barriers & Risk Stratification:  Activity Barriers & Cardiac Risk Stratification - 05/13/20 0822      Activity Barriers & Cardiac Risk Stratification   Activity Barriers History of Falls;Other (comment);Right Knee Replacement    Comments Sciatica, stenosis: lumbar spine    Cardiac Risk Stratification Low           6 Minute Walk:  6 Minute Walk    Row Name 05/13/20 0836         6 Minute Walk   Phase Initial     Distance  1818 feet     Walk Time 6 minutes     # of Rest Breaks 0     MPH 3.44     METS 4.59     RPE 9     Perceived Dyspnea  0     VO2 Peak 16.06     Symptoms Yes (comment)     Comments Patient c/o feeling a "little winded" during the walk test.     Resting HR 90 bpm     Resting BP 142/70     Resting Oxygen Saturation  98 %     Exercise Oxygen Saturation  during 6 min walk 98 %     Max Ex. HR 126 bpm     Max Ex. BP 168/70     2 Minute Post BP 130/78            Oxygen Initial Assessment:   Oxygen Re-Evaluation:   Oxygen Discharge (Final Oxygen Re-Evaluation):   Initial Exercise Prescription:  Initial Exercise Prescription - 05/13/20 1000      Date of Initial Exercise RX and Referring Provider   Date 05/13/20    Referring Provider [place], Peter M, MD.    Expected Discharge Date 07/11/20      NuStep   Level 3    SPM 85    Minutes 15    METs 3      Track   Laps 20    Minutes 15    METs 3.32      Prescription Details   Frequency (times per week) 3    Duration Progress to 30 minutes of continuous aerobic without signs/symptoms of physical distress      Intensity   THRR 40-80% of Max Heartrate 64-128    Ratings of Perceived Exertion 11-13    Perceived Dyspnea 0-4      Progression   Progression Continue to progress workloads to maintain intensity without signs/symptoms of physical distress.      Resistance Training   Training Prescription Yes    Weight 5lbs    Reps 10-15           Perform Capillary Blood Glucose checks as needed.  Exercise Prescription Changes:   Exercise Comments:   Exercise Goals and Review:   Exercise Goals    Row Name 05/13/20 0827             Exercise Goals   Increase Physical Activity Yes       Intervention Provide advice, education, support and counseling about physical activity/exercise needs.;Develop an individualized exercise prescription for aerobic and resistive training based on initial evaluation findings, risk  stratification, comorbidities and participant's personal goals.       Expected Outcomes Short Term: Attend rehab on a regular basis to  increase amount of physical activity.;Long Term: Exercising regularly at least 3-5 days a week.;Long Term: Add in home exercise to make exercise part of routine and to increase amount of physical activity.       Increase Strength and Stamina Yes       Intervention Provide advice, education, support and counseling about physical activity/exercise needs.;Develop an individualized exercise prescription for aerobic and resistive training based on initial evaluation findings, risk stratification, comorbidities and participant's personal goals.       Expected Outcomes Short Term: Increase workloads from initial exercise prescription for resistance, speed, and METs.;Short Term: Perform resistance training exercises routinely during rehab and add in resistance training at home;Long Term: Improve cardiorespiratory fitness, muscular endurance and strength as measured by increased METs and functional capacity ( )       Able to understand and use rate of perceived exertion (RPE) scale Yes       Intervention Provide education and explanation on how to use RPE scale       Expected Outcomes Short Term: Able to use RPE daily in rehab to express subjective intensity level;Long Term:  Able to use RPE to guide intensity level when exercising independently       Knowledge and understanding of Target Heart Rate Range (THRR) Yes       Intervention Provide education and explanation of THRR including how the numbers were predicted and where they are located for reference       Expected Outcomes Short Term: Able to state/look up THRR;Long Term: Able to use THRR to govern intensity when exercising independently;Short Term: Able to use daily as guideline for intensity in rehab       Able to check pulse independently Yes       Intervention Provide education and demonstration on how to check pulse  in carotid and radial arteries.;Review the importance of being able to check your own pulse for safety during independent exercise       Expected Outcomes Short Term: Able to explain why pulse checking is important during independent exercise;Long Term: Able to check pulse independently and accurately       Understanding of Exercise Prescription Yes       Intervention Provide education, explanation, and written materials on patient's individual exercise prescription       Expected Outcomes Short Term: Able to explain program exercise prescription;Long Term: Able to explain home exercise prescription to exercise independently              Exercise Goals Re-Evaluation :   Discharge Exercise Prescription (Final Exercise Prescription Changes):   Nutrition:  Target Goals: Understanding of nutrition guidelines, daily intake of sodium 1500mg , cholesterol 200mg , calories 30% from fat and 7% or less from saturated fats, daily to have 5 or more servings of fruits and vegetables.  Biometrics:  Pre Biometrics - 05/13/20 0803      Pre Biometrics   Waist Circumference 43.5 inches    Hip Circumference 47.5 inches    Waist to Hip Ratio 0.92 %    Triceps Skinfold 14.5 mm    % Body Fat 30.8 %    Grip Strength 43.5 kg    Flexibility 15.25 in    Single Leg Stand 1.43 seconds            Nutrition Therapy Plan and Nutrition Goals:   Nutrition Assessments:   Nutrition Goals Re-Evaluation:   Nutrition Goals Re-Evaluation:   Nutrition Goals Discharge (Final Nutrition Goals Re-Evaluation):   Psychosocial: Target Goals: Acknowledge  presence or absence of significant depression and/or stress, maximize coping skills, provide positive support system. Participant is able to verbalize types and ability to use techniques and skills needed for reducing stress and depression.  Initial Review & Psychosocial Screening:  Initial Psych Review & Screening - 05/13/20 1003      Initial Review    Current issues with History of Depression;Current Stress Concerns    Source of Stress Concerns Occupation    Comments Mr. Kloos presents to cardiac rehab orientation with a positive attitude and outlook. He is eager to recieve education on exercise and risk factor modifications. He does have a history of depression and admits to having depression symptoms for the first few weeks after his intervention however those feelings have deminished. He is married and has grown children and step children. Feels his wife could be more understanding that change related to diet and exercise is hard to make all at once. He feels some stress as it related to his work. Feels unsupported by his co-workers. States his co-workers do not understand lifting restrictions or safety as it relates to brusing and bleeding from brillinta if he is not careful. 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 Denies psychosocial barriers to self health management or participation in CR. No interventions needed at this time.      Family Dynamics   Good Support System? Yes      Barriers   Psychosocial barriers to participate in program The patient should benefit from training in stress management and relaxation.;There are no identifiable barriers or psychosocial needs.   provided patient with stress management tools via written education materials     Screening Interventions   Interventions Encouraged to exercise           Quality of Life Scores:  Scores of 19 and below usually indicate a poorer quality of life in these areas.  A difference of  2-3 points is a clinically meaningful difference.  A difference of 2-3 points in the total score of the Quality of Life Index has been associated with significant improvement in overall quality of life, self-image, physical symptoms, and general health in studies assessing change in quality of life.  PHQ-9: Recent Review Flowsheet Data    [facility]  05/13/2020   Decreased Interest 0   Down, Depressed, Hopeless 0   PHQ - 2 Score 0     Interpretation of Total Score  Total Score Depression Severity:  1-4 = Minimal depression, 5-9 = Mild depression, 10-14 = Moderate depression, 15-19 = Moderately severe depression, 20-27 = Severe depression   Psychosocial Evaluation and Intervention:   Psychosocial Re-Evaluation:   Psychosocial Discharge (Final Psychosocial Re-Evaluation):   Vocational Rehabilitation: Provide vocational rehab assistance to qualifying candidates.   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 Participant will state understanding/return demonstration of topics presented.  Learning Barriers/Preferences:   Education Topics: Count Your Pulse:  -Group instruction provided by verbal instruction, demonstration, patient participation and written materials to support subject.  Instructors address importance of being able to find your  pulse and how to count your pulse when at home without a heart monitor.  Patients get hands on experience counting their pulse with staff help and individually.   Heart Attack, Angina, and Risk Factor Modification:  -Group instruction provided by verbal instruction, video, and written materials to support subject.  Instructors address signs and symptoms of angina and heart attacks.    Also discuss risk factors for heart disease and how to make changes to improve heart health risk factors.   Functional Fitness:  -Group instruction provided by verbal instruction, demonstration, patient participation, and written materials  to support subject.  Instructors address safety measures for doing things around the house.  Discuss how to get up and down off the floor, how to pick things up properly, how to safely get out of a chair without assistance, and balance training.   Meditation and Mindfulness:  -Group instruction provided by verbal instruction, patient participation, and written materials to support subject.  Instructor addresses importance of mindfulness and meditation practice to help reduce stress and improve awareness.  Instructor also leads participants through a meditation exercise.    Stretching for Flexibility and Mobility:  -Group instruction provided by verbal instruction, patient participation, and written materials to support subject.  Instructors lead participants through series of stretches that are designed to increase flexibility thus improving mobility.  These stretches are additional exercise for major muscle groups that are typically performed during regular warm up and cool down.   Hands Only CPR:  -Group verbal, video, and participation provides a basic overview of AHA guidelines for community CPR. Role-play of emergencies allow participants the opportunity to practice calling for help and chest compression technique with discussion of AED use.   Hypertension: -Group verbal and written instruction that provides a basic overview of hypertension including the most recent diagnostic guidelines, risk factor reduction with self-care instructions and medication management.    Nutrition I class: Heart Healthy Eating:  -Group instruction provided by PowerPoint slides, verbal discussion, and written materials to support subject matter. The instructor gives an explanation and review of the Therapeutic Lifestyle Changes diet recommendations, which includes a discussion on lipid goals, dietary fat, sodium, fiber, plant stanol/sterol esters, sugar, and the components of a well-balanced, healthy  diet.   Nutrition II class: Lifestyle Skills:  -Group instruction provided by PowerPoint slides, verbal discussion, and written materials to support subject matter. The instructor gives an explanation and review of label reading, grocery shopping for heart health, heart healthy recipe modifications, and ways to make healthier choices when eating out.   Diabetes Question & Answer:  -Group instruction provided by PowerPoint slides, verbal discussion, and written materials to support subject matter. The instructor gives an explanation and review of diabetes co-morbidities, pre- and post-prandial blood glucose goals, pre-exercise blood glucose goals, signs, symptoms, and treatment of hypoglycemia and hyperglycemia, and foot care basics.   Diabetes Blitz:  -Group instruction provided by PowerPoint slides, verbal discussion, and written materials to support subject matter. The instructor gives an explanation and review of the physiology behind type 1 and type 2 diabetes, diabetes medications and rational behind using different medications, pre- and post-prandial blood glucose recommendations and Hemoglobin A1c goals, diabetes diet, and exercise including blood glucose guidelines for exercising safely.    Portion Distortion:  -Group instruction provided by PowerPoint slides, verbal discussion, written materials, and food models to support subject matter. The instructor gives an explanation of serving size versus portion size, changes in portions sizes over the last 20 years,  and what consists of a serving from each food group.   Stress Management:  -Group instruction provided by verbal instruction, video, and written materials to support subject matter.  Instructors review role of stress in heart disease and how to cope with stress positively.     Exercising on Your Own:  -Group instruction provided by verbal instruction, power point, and written materials to support subject.  Instructors discuss  benefits of exercise, components of exercise, frequency and intensity of exercise, and end points for exercise.  Also discuss use of nitroglycerin and activating EMS.  Review options of places to exercise outside of rehab.  Review guidelines for sex with heart disease.   Cardiac Drugs I:  -Group instruction provided by verbal instruction and written materials to support subject.  Instructor reviews cardiac drug classes: antiplatelets, anticoagulants, beta blockers, and statins.  Instructor discusses reasons, side effects, and lifestyle considerations for each drug class.   Cardiac Drugs II:  -Group instruction provided by verbal instruction and written materials to support subject.  Instructor reviews cardiac drug classes: angiotensin converting enzyme inhibitors (ACE-I), angiotensin II receptor blockers (ARBs), nitrates, and calcium channel blockers.  Instructor discusses reasons, side effects, and lifestyle considerations for each drug class.   Anatomy and Physiology of the Circulatory System:  Group verbal and written instruction and models provide basic cardiac anatomy and physiology, with the coronary electrical and arterial systems. Review of: AMI, Angina, Valve disease, Heart Failure, Peripheral Artery Disease, Cardiac Arrhythmia, Pacemakers, and the ICD.   Other Education:  -Group or individual verbal, written, or video instructions that support the educational goals of the cardiac rehab program.   Holiday Eating Survival Tips:  -Group instruction provided by PowerPoint slides, verbal discussion, and written materials to support subject matter. The instructor gives patients tips, tricks, and techniques to help them not only survive but enjoy the holidays despite the onslaught of food that accompanies the holidays.   Knowledge Questionnaire Score:   Core Components/Risk Factors/Patient Goals at Admission:  Personal Goals and Risk Factors at Admission - 05/13/20 1009      Core  Components/Risk Factors/Patient Goals on Admission    Weight Management Yes;Obesity    Intervention Weight Management/Obesity: Establish reasonable short term and long term weight goals.;Obesity: Provide education and appropriate resources to help participant work on and attain dietary goals.    Admit Weight 240 lb 4.8 oz (109 kg)    Expected Outcomes Short Term: Continue to assess and modify interventions until short term weight is achieved;Long Term: Adherence to nutrition and physical activity/exercise program aimed toward attainment of established weight goal;Weight Loss: Understanding of general recommendations for a balanced deficit meal plan, which promotes 1-2 lb weight loss per week and includes a negative energy balance of [phone] kcal/d;Understanding recommendations for meals to include 15-35% energy as protein, 25-35% energy from fat, 35-60% energy from carbohydrates, less than 200mg  of dietary cholesterol, 20-35 gm of total fiber daily;Understanding of distribution of calorie intake throughout the day with the consumption of 4-5 meals/snacks    Hypertension Yes    Intervention Provide education on lifestyle modifcations including regular physical activity/exercise, weight management, moderate sodium restriction and increased consumption of fresh fruit, vegetables, and low fat dairy, alcohol moderation, and smoking cessation.;Monitor prescription use compliance.    Expected Outcomes Short Term: Continued assessment and intervention until BP is < 140/72mm HG in hypertensive participants. < 130/70mm HG in hypertensive participants with diabetes, heart failure or chronic kidney disease.;Long Term: Maintenance of blood pressure at goal levels.  Lipids Yes    Intervention Provide education and support for participant on nutrition & aerobic/resistive exercise along with prescribed medications to achieve LDL 70mg , HDL >40mg .    Expected Outcomes Short Term: Participant states understanding of  desired cholesterol values and is compliant with medications prescribed. Participant is following exercise prescription and nutrition guidelines.;Long Term: Cholesterol controlled with medications as prescribed, with individualized exercise RX and with personalized nutrition plan. Value goals: LDL < , HDL > 40 mg.    Stress Yes    Intervention Offer individual and/or small group education and counseling on adjustment to heart disease, stress management and health-related lifestyle change. Teach and support self-help strategies.;Refer participants experiencing significant psychosocial distress to appropriate mental health specialists for further evaluation and treatment. When possible, include family members and significant others in education/counseling sessions.    Expected Outcomes Short Term: Participant demonstrates changes in health-related behavior, relaxation and other stress management skills, ability to obtain effective social support, and compliance with psychotropic medications if prescribed.;Long Term: Emotional wellbeing is indicated by absence of clinically significant psychosocial distress or social isolation.           Core Components/Risk Factors/Patient Goals Review:    Core Components/Risk Factors/Patient Goals at Discharge (Final Review):    ITP Comments:  ITP Comments    Row Name 05/13/20 0804           ITP Comments Dr. Armanda Magic Medical Director [facility]              Comments: Patient attended orientation on 05/13/2020 to review rules and guidelines for program.  Completed 6 minute walk test, Intitial ITP, and exercise prescription.  VSS. Telemetry-NSR.  Asymptomatic. Safety measures and social distancing in place per CDC guidelines.

## 2020-05-19 ENCOUNTER — Encounter (HOSPITAL_COMMUNITY)
Admission: RE | Admit: 2020-05-19 | Discharge: 2020-05-19 | Disposition: A | Discharge status: Home / Self Care | Payer: 59 | Source: Ambulatory Visit | Attending: Cardiology | Admitting: Cardiology

## 2020-05-19 ENCOUNTER — Other Ambulatory Visit: Payer: Self-pay

## 2020-05-19 DIAGNOSIS — Z9861 Coronary angioplasty status: Secondary | ICD-10-CM | POA: Insufficient documentation

## 2020-05-19 DIAGNOSIS — I2510 Atherosclerotic heart disease of native coronary artery without angina pectoris: Secondary | ICD-10-CM | POA: Insufficient documentation

## 2020-05-19 DIAGNOSIS — Z955 Presence of coronary angioplasty implant and graft: Secondary | ICD-10-CM | POA: Diagnosis present

## 2020-05-19 NOTE — Progress Notes (Signed)
Daily Session Note  Patient Details  Name: Stephen Scott MRN: [id number] Date of Birth: Sep 15, 1961 Referring Provider:     [place] from 05/13/2020 in [place]  Referring Provider [place], Peter M, MD.      Encounter Date: 05/19/2020  Check In:  Session Check In - 05/19/20 0708      Check-In   Supervising physician immediately available to respond to emergencies [facility] immediately available    Physician(s) Dr.Woods    Location [facility]    Staff Present Dorma Russell, MS,ACSM CEP, Exercise Physiologist;Treyshaun Keatts Rollene Rotunda, RN, Deland Pretty, MS, ACSM CEP, Exercise Physiologist;Jessica Hassell Done, MS, ACSM-CEP, Exercise Physiologist    Virtual Visit No    Medication changes reported     No    Fall or balance concerns reported    No    Tobacco Cessation No Change    Warm-up and Cool-down Performed on first and last piece of equipment    Resistance Training Performed Yes    VAD Patient? No    PAD/SET Patient? No      Pain Assessment   Currently in Pain? No/denies    Pain Score 0-No pain    Multiple Pain Sites No           Capillary Blood Glucose: No results found for this or any previous visit (from the past 24 hour(s)).   Exercise Prescription Changes - 05/19/20 0710      Response to Exercise   Blood Pressure (Admit) 124/54    Blood Pressure (Exercise) 168/58    Blood Pressure (Exit) 116/62    Heart Rate (Admit) 63 bpm    Heart Rate (Exercise) 116 bpm    Heart Rate (Exit) 67 bpm    Rating of Perceived Exertion (Exercise) 11    Symptoms none    Comments Off to a good start with exercise.     Duration Continue with 30 min of aerobic exercise without signs/symptoms of physical distress.    Intensity THRR unchanged      Progression   Progression Continue to progress workloads to maintain intensity without signs/symptoms of physical distress.    Average METs 3.2      Resistance  Training   Training Prescription Yes    Weight 5lbs    Reps 10-15    Time 10 Minutes      Interval Training   Interval Training No      NuStep   Level 3    SPM 85    Minutes 15    METs 2.7      Track   Laps 23    Minutes 15    METs 3.67           Social History   Tobacco Use  Smoking Status Never Smoker  Smokeless Tobacco Never Used    Goals Met:  Exercise tolerated well Personal goals reviewed No report of cardiac concerns or symptoms Strength training completed today  Goals Unmet:  Not Applicable  Comments: Pt started cardiac rehab today.  Pt tolerated light exercise without difficulty. VSS, telemetry-NSR, asymptomatic.  Medication list reconciled. Pt denies barriers to medicaiton compliance.  PSYCHOSOCIAL ASSESSMENT:  PHQ-0. Pt exhibits positive coping skills, hopeful outlook with supportive family. No psychosocial needs identified at this time, no psychosocial interventions necessary.  Pt oriented to exercise equipment and routine. Understanding verbalized.   Dr. Fransico Him is Medical Director for Cardiac Rehab at [facility].

## 2020-05-21 ENCOUNTER — Encounter (HOSPITAL_COMMUNITY)
Admission: RE | Admit: 2020-05-21 | Discharge: 2020-05-21 | Disposition: A | Discharge status: Home / Self Care | Payer: 59 | Source: Ambulatory Visit | Attending: Cardiology | Admitting: Cardiology

## 2020-05-21 ENCOUNTER — Other Ambulatory Visit: Payer: Self-pay

## 2020-05-21 DIAGNOSIS — I2510 Atherosclerotic heart disease of native coronary artery without angina pectoris: Secondary | ICD-10-CM | POA: Diagnosis not present

## 2020-05-21 DIAGNOSIS — Z955 Presence of coronary angioplasty implant and graft: Secondary | ICD-10-CM

## 2020-05-23 ENCOUNTER — Other Ambulatory Visit: Payer: Self-pay

## 2020-05-23 ENCOUNTER — Encounter (HOSPITAL_COMMUNITY)
Admission: RE | Admit: 2020-05-23 | Discharge: 2020-05-23 | Disposition: A | Discharge status: Home / Self Care | Payer: 59 | Source: Ambulatory Visit | Attending: Cardiology | Admitting: Cardiology

## 2020-05-23 DIAGNOSIS — Z955 Presence of coronary angioplasty implant and graft: Secondary | ICD-10-CM

## 2020-05-23 DIAGNOSIS — Z9861 Coronary angioplasty status: Secondary | ICD-10-CM

## 2020-05-23 DIAGNOSIS — I2510 Atherosclerotic heart disease of native coronary artery without angina pectoris: Secondary | ICD-10-CM

## 2020-05-23 NOTE — Progress Notes (Signed)
Stephen Scott 59 y.o. male Nutrition Note  Visit Diagnosis: CAD S/P percutaneous coronary angioplasty  Status post coronary artery stent placement  Past Medical History:  Diagnosis Date  . Anemia    as a small child  . Anxiety   . Arthritis    "lower spine; knees" (11/01/2017)  . Chronic lower back pain   . Depression   . Family history of adverse reaction to anesthesia    "daughter PONV"  . GERD (gastroesophageal reflux disease)   . High cholesterol   . History of kidney stones   . Hypertension   . Pneumonia 06/2017; 08/2017   walking pneumonia; treated  . Spinal stenosis    "lower back" (11/01/2017)     Medications reviewed.   Current Outpatient Medications:  .  amLODipine (NORVASC) 10 MG tablet, Take 10 mg by mouth daily., Disp: , Rfl:  .  aspirin EC 81 MG tablet, Take 1 tablet (81 mg total) by mouth daily., Disp: 90 tablet, Rfl: 3 .  atorvastatin (LIPITOR) 80 MG tablet, Take 1 tablet (80 mg total) by mouth daily., Disp: 90 tablet, Rfl: 3 .  buPROPion (WELLBUTRIN XL) 300 MG 24 hr tablet, Take 300 mg by mouth daily. , Disp: , Rfl:  .  famotidine (PEPCID) 20 MG tablet, Take 20 mg by mouth at bedtime as needed for heartburn.  (Patient not taking: Reported on 05/13/2020), Disp: , Rfl:  .  gabapentin (NEURONTIN) 300 MG capsule, Take 300 mg by mouth at bedtime., Disp: , Rfl:  .  loratadine (CLARITIN) 10 MG tablet, Take 10 mg by mouth daily as needed for allergies. (Patient not taking: Reported on 05/13/2020), Disp: , Rfl:  .  losartan (COZAAR) 100 MG tablet, Take 100 mg by mouth daily., Disp: , Rfl:  .  methocarbamol (ROBAXIN) 500 MG tablet, Take 500 mg by mouth every 8 (eight) hours as needed for muscle spasms. (Patient not taking: Reported on 05/13/2020), Disp: , Rfl:  .  nitroGLYCERIN (NITROSTAT) 0.4 MG SL tablet, Place 1 tablet (0.4 mg total) under the tongue every 5 (five) minutes as needed for chest pain. (Patient not taking: Reported on 05/13/2020), Disp: 90 tablet, Rfl: 3 .   Polyethyl Glycol-Propyl Glycol (SYSTANE OP), Place 1 drop into both eyes daily as needed (dry eyes)., Disp: , Rfl:  .  sertraline (ZOLOFT) 100 MG tablet, Take 100 mg by mouth daily., Disp: , Rfl:  .  ticagrelor (BRILINTA) 90 MG TABS tablet, Take 1 tablet (90 mg total) by mouth 2 (two) times daily., Disp: 60 tablet, Rfl: 11   Ht Readings from Last 1 Encounters:  05/13/20 5' 10.5" (1.791 m)     Wt Readings from Last 3 Encounters:  05/13/20 240 lb 4.8 oz (109 kg)  03/25/20 248 lb (112.5 kg)  03/04/20 258 lb (117 kg)     There is no height or weight on file to calculate BMI.   Social History   Tobacco Use  Smoking Status Never Smoker  Smokeless Tobacco Never Used     No results found for: CHOL No results found for: HDL No results found for: LDLCALC No results found for: TRIG   No results found for: HGBA1C   CBG (last 3)  No results for input(s): GLUCAP in the last 72 hours.   Nutrition Note  Spoke with pt. Nutrition Plan and Nutrition Survey goals reviewed with pt. Pt is following a Heart Healthy diet. Pt wants to lose wt. Pt has lost 37 lbs in 3 months by making  heart healthy changes. He reports fatigue and tiredness likely from inadequate intake and rapid weight loss.   Per discussion, pt does not use canned/convenience foods often. Pt does not add salt to food. Pt does not eat out frequently.  Pt really enjoys cooking and has made changes by incorporating fruits, veggies, whole grains, and low sodium seasonings.  No sugary beverages. Pt to do food logx1 week to assess adequate intake.   Pt expressed understanding of the information reviewed.    Nutrition Diagnosis ? Food-and nutrition-related knowledge deficit related to lack of exposure to information as related to diagnosis of: ? CVD ?   Nutrition Intervention ? Pt's individual nutrition plan reviewed with pt. ? Benefits of adopting Heart Healthy diet discussed when Medficts reviewed.   ? Continue client-centered  nutrition education by RD, as part of interdisciplinary care.  Goal(s) ? Pt to identify food quantities necessary to achieve weight loss of 6-24 lb at graduation from cardiac rehab.  ? Pt to build a healthy plate including vegetables, fruits, whole grains, and low-fat dairy products in a heart healthy meal plan.  Plan:   Will provide client-centered nutrition education as part of interdisciplinary care  Monitor and evaluate progress toward nutrition goal with team.   Andrey Campanile, MS, RDN, LDN

## 2020-05-26 ENCOUNTER — Other Ambulatory Visit: Payer: Self-pay

## 2020-05-26 ENCOUNTER — Encounter (HOSPITAL_COMMUNITY)
Admission: RE | Admit: 2020-05-26 | Discharge: 2020-05-26 | Disposition: A | Discharge status: Home / Self Care | Payer: 59 | Source: Ambulatory Visit | Attending: Cardiology | Admitting: Cardiology

## 2020-05-26 DIAGNOSIS — I2510 Atherosclerotic heart disease of native coronary artery without angina pectoris: Secondary | ICD-10-CM

## 2020-05-26 DIAGNOSIS — Z955 Presence of coronary angioplasty implant and graft: Secondary | ICD-10-CM

## 2020-05-28 ENCOUNTER — Other Ambulatory Visit: Payer: Self-pay

## 2020-05-28 ENCOUNTER — Encounter (HOSPITAL_COMMUNITY)
Admission: RE | Admit: 2020-05-28 | Discharge: 2020-05-28 | Disposition: A | Discharge status: Home / Self Care | Payer: 59 | Source: Ambulatory Visit | Attending: Cardiology | Admitting: Cardiology

## 2020-05-28 DIAGNOSIS — Z955 Presence of coronary angioplasty implant and graft: Secondary | ICD-10-CM | POA: Diagnosis present

## 2020-05-28 DIAGNOSIS — I2510 Atherosclerotic heart disease of native coronary artery without angina pectoris: Secondary | ICD-10-CM | POA: Diagnosis present

## 2020-05-28 DIAGNOSIS — Z9861 Coronary angioplasty status: Secondary | ICD-10-CM | POA: Diagnosis present

## 2020-05-30 ENCOUNTER — Encounter (HOSPITAL_COMMUNITY)
Admission: RE | Admit: 2020-05-30 | Discharge: 2020-05-30 | Disposition: A | Discharge status: Home / Self Care | Payer: 59 | Source: Ambulatory Visit | Attending: Cardiology | Admitting: Cardiology

## 2020-05-30 ENCOUNTER — Other Ambulatory Visit: Payer: Self-pay

## 2020-05-30 DIAGNOSIS — I2510 Atherosclerotic heart disease of native coronary artery without angina pectoris: Secondary | ICD-10-CM | POA: Diagnosis not present

## 2020-05-30 DIAGNOSIS — Z955 Presence of coronary angioplasty implant and graft: Secondary | ICD-10-CM

## 2020-06-03 NOTE — Progress Notes (Signed)
Cardiac Individual Treatment Plan  Patient Details  Name: Stephen Scott MRN: [id number] Date of Birth: Jan 04, 1961 Referring Provider:     CARDIAC REHAB PHASE II ORIENTATION from 05/13/2020 in [place]  Referring Provider [place], Peter M, MD.      Initial Encounter Date:    CARDIAC REHAB PHASE II ORIENTATION from 05/13/2020 in [place]  Date 05/13/20      Visit Diagnosis: Status post coronary artery stent placement  CAD S/P percutaneous coronary angioplasty  Patient's Home Medications on Admission:  Current Outpatient Medications:  .  amLODipine (NORVASC) 10 MG tablet, Take 10 mg by mouth daily., Disp: , Rfl:  .  aspirin EC 81 MG tablet, Take 1 tablet (81 mg total) by mouth daily., Disp: 90 tablet, Rfl: 3 .  atorvastatin (LIPITOR) 80 MG tablet, Take 1 tablet (80 mg total) by mouth daily., Disp: 90 tablet, Rfl: 3 .  buPROPion (WELLBUTRIN XL) 300 MG 24 hr tablet, Take 300 mg by mouth daily. , Disp: , Rfl:  .  famotidine (PEPCID) 20 MG tablet, Take 20 mg by mouth at bedtime as needed for heartburn.  (Patient not taking: Reported on 05/13/2020), Disp: , Rfl:  .  gabapentin (NEURONTIN) 300 MG capsule, Take 300 mg by mouth at bedtime., Disp: , Rfl:  .  loratadine (CLARITIN) 10 MG tablet, Take 10 mg by mouth daily as needed for allergies. (Patient not taking: Reported on 05/13/2020), Disp: , Rfl:  .  losartan (COZAAR) 100 MG tablet, Take 100 mg by mouth daily., Disp: , Rfl:  .  methocarbamol (ROBAXIN) 500 MG tablet, Take 500 mg by mouth every 8 (eight) hours as needed for muscle spasms. (Patient not taking: Reported on 05/13/2020), Disp: , Rfl:  .  nitroGLYCERIN (NITROSTAT) 0.4 MG SL tablet, Place 1 tablet (0.4 mg total) under the tongue every 5 (five) minutes as needed for chest pain. (Patient not taking: Reported on 05/13/2020), Disp: 90 tablet, Rfl: 3 .  Polyethyl Glycol-Propyl Glycol (SYSTANE OP), Place 1 drop into both eyes  daily as needed (dry eyes)., Disp: , Rfl:  .  sertraline (ZOLOFT) 100 MG tablet, Take 100 mg by mouth daily., Disp: , Rfl:  .  ticagrelor (BRILINTA) 90 MG TABS tablet, Take 1 tablet (90 mg total) by mouth 2 (two) times daily., Disp: 60 tablet, Rfl: 11  Past Medical History: Past Medical History:  Diagnosis Date  . Anemia    as a small child  . Anxiety   . Arthritis    "lower spine; knees" (11/01/2017)  . Chronic lower back pain   . Depression   . Family history of adverse reaction to anesthesia    "daughter PONV"  . GERD (gastroesophageal reflux disease)   . High cholesterol   . History of kidney stones   . Hypertension   . Pneumonia 06/2017; 08/2017   walking pneumonia; treated  . Spinal stenosis    "lower back" (11/01/2017)    Tobacco Use: Social History   Tobacco Use  Smoking Status Never Smoker  Smokeless Tobacco Never Used    Labs: Recent Review Flowsheet Data   There is no flowsheet data to display.     Capillary Blood Glucose: No results found for: GLUCAP   Exercise Target Goals: Exercise Program Goal: Individual exercise prescription set using results from initial 6 min walk test and THRR while considering  patient's activity barriers and safety.   Exercise Prescription Goal: Initial exercise prescription builds to 30-45 minutes  a day of aerobic activity, 2-3 days per week.  Home exercise guidelines will be given to patient during program as part of exercise prescription that the participant will acknowledge.  Activity Barriers & Risk Stratification:  Activity Barriers & Cardiac Risk Stratification - 05/13/20 0822      Activity Barriers & Cardiac Risk Stratification   Activity Barriers History of Falls;Other (comment);Right Knee Replacement    Comments Sciatica, stenosis: lumbar spine    Cardiac Risk Stratification Low           6 Minute Walk:  6 Minute Walk    Row Name 05/13/20 0836         6 Minute Walk   Phase Initial     Distance 1818  feet     Walk Time 6 minutes     # of Rest Breaks 0     MPH 3.44     METS 4.59     RPE 9     Perceived Dyspnea  0     VO2 Peak 16.06     Symptoms Yes (comment)     Comments Patient c/o feeling a "little winded" during the walk test.     Resting HR 90 bpm     Resting BP 142/70     Resting Oxygen Saturation  98 %     Exercise Oxygen Saturation  during 6 min walk 98 %     Max Ex. HR 126 bpm     Max Ex. BP 168/70     2 Minute Post BP 130/78            Oxygen Initial Assessment:   Oxygen Re-Evaluation:   Oxygen Discharge (Final Oxygen Re-Evaluation):   Initial Exercise Prescription:  Initial Exercise Prescription - 05/13/20 1000      Date of Initial Exercise RX and Referring Provider   Date 05/13/20    Referring Provider [place], Peter M, MD.    Expected Discharge Date 07/11/20      NuStep   Level 3    SPM 85    Minutes 15    METs 3      Track   Laps 20    Minutes 15    METs 3.32      Prescription Details   Frequency (times per week) 3    Duration Progress to 30 minutes of continuous aerobic without signs/symptoms of physical distress      Intensity   THRR 40-80% of Max Heartrate 64-128    Ratings of Perceived Exertion 11-13    Perceived Dyspnea 0-4      Progression   Progression Continue to progress workloads to maintain intensity without signs/symptoms of physical distress.      Resistance Training   Training Prescription Yes    Weight 5lbs    Reps 10-15           Perform Capillary Blood Glucose checks as needed.  Exercise Prescription Changes:   Exercise Prescription Changes    Row Name 05/19/20 0710 06/04/20 0703           Response to Exercise   Blood Pressure (Admit) 124/54 110/62      Blood Pressure (Exercise) 168/58 130/72      Blood Pressure (Exit) 116/62 120/72      Heart Rate (Admit) 63 bpm 72 bpm      Heart Rate (Exercise) 116 bpm 101 bpm      Heart Rate (Exit) 67 bpm 79 bpm      Rating of  Perceived Exertion (Exercise) 11 12       Symptoms none none      Comments Off to a good start with exercise.  --      Duration Continue with 30 min of aerobic exercise without signs/symptoms of physical distress. Continue with 30 min of aerobic exercise without signs/symptoms of physical distress.      Intensity THRR unchanged THRR unchanged        Progression   Progression Continue to progress workloads to maintain intensity without signs/symptoms of physical distress. Continue to progress workloads to maintain intensity without signs/symptoms of physical distress.      Average METs 3.2 2.8        Resistance Training   Training Prescription Yes No      Weight 5lbs --      Reps 10-15 --      Time 10 Minutes --        Interval Training   Interval Training No No        NuStep   Level 3 4      SPM 85 85      Minutes 15 15      METs 2.7 2.4        Track   Laps 23 19      Minutes 15 15      METs 3.67 3.2        Home Exercise Plan   Plans to continue exercise at -- Home (comment)  Walking      Frequency -- Add 3 additional days to program exercise sessions.      Initial Home Exercises Provided -- 06/04/20             Exercise Comments:   Exercise Comments    Row Name 05/19/20 1610 06/04/20 0733         Exercise Comments Patient tolerated first session of exercise well without symptoms. Reviewed home exercise guidelines, METs, and goals with patient.             Exercise Goals and Review:   Exercise Goals    Row Name 05/13/20 0827             Exercise Goals   Increase Physical Activity Yes       Intervention Provide advice, education, support and counseling about physical activity/exercise needs.;Develop an individualized exercise prescription for aerobic and resistive training based on initial evaluation findings, risk stratification, comorbidities and participant's personal goals.       Expected Outcomes Short Term: Attend rehab on a regular basis to increase amount of physical activity.;Long  Term: Exercising regularly at least 3-5 days a week.;Long Term: Add in home exercise to make exercise part of routine and to increase amount of physical activity.       Increase Strength and Stamina Yes       Intervention Provide advice, education, support and counseling about physical activity/exercise needs.;Develop an individualized exercise prescription for aerobic and resistive training based on initial evaluation findings, risk stratification, comorbidities and participant's personal goals.       Expected Outcomes Short Term: Increase workloads from initial exercise prescription for resistance, speed, and METs.;Short Term: Perform resistance training exercises routinely during rehab and add in resistance training at home;Long Term: Improve cardiorespiratory fitness, muscular endurance and strength as measured by increased METs and functional capacity ( )       Able to understand and use rate of perceived exertion (RPE) scale Yes       Intervention Provide  education and explanation on how to use RPE scale       Expected Outcomes Short Term: Able to use RPE daily in rehab to express subjective intensity level;Long Term:  Able to use RPE to guide intensity level when exercising independently       Knowledge and understanding of Target Heart Rate Range (THRR) Yes       Intervention Provide education and explanation of THRR including how the numbers were predicted and where they are located for reference       Expected Outcomes Short Term: Able to state/look up THRR;Long Term: Able to use THRR to govern intensity when exercising independently;Short Term: Able to use daily as guideline for intensity in rehab       Able to check pulse independently Yes       Intervention Provide education and demonstration on how to check pulse in carotid and radial arteries.;Review the importance of being able to check your own pulse for safety during independent exercise       Expected Outcomes Short Term: Able to  explain why pulse checking is important during independent exercise;Long Term: Able to check pulse independently and accurately       Understanding of Exercise Prescription Yes       Intervention Provide education, explanation, and written materials on patient's individual exercise prescription       Expected Outcomes Short Term: Able to explain program exercise prescription;Long Term: Able to explain home exercise prescription to exercise independently              Exercise Goals Re-Evaluation :  Exercise Goals Re-Evaluation    Row Name 05/19/20 1610 06/04/20 0733           Exercise Goal Re-Evaluation   Exercise Goals Review Increase Physical Activity;Able to understand and use rate of perceived exertion (RPE) scale Increase Physical Activity;Able to understand and use rate of perceived exertion (RPE) scale;Understanding of Exercise Prescription;Increase Strength and Stamina;Knowledge and understanding of Target Heart Rate Range (THRR)      Comments Patient able to use and understand RPE scale appropriately. Reviewed home exercise guidelines with patient including endpoints, temperature precautions, target heart rate and rate of perceived exertion. Pt is walking 15-30 minutes, 3 days/week as his mode of home exercise. Pt voices understanding of instructions given.      Expected Outcomes Increase workloads as tolerated to help achieve personal health and fitness goals. Patient will continue walking at least 3 days/week in addition to exercise at cardiac rehab to help build a consistent routine that he can continue upon completion of the program.             Discharge Exercise Prescription (Final Exercise Prescription Changes):  Exercise Prescription Changes - 06/04/20 0703      Response to Exercise   Blood Pressure (Admit) 110/62    Blood Pressure (Exercise) 130/72    Blood Pressure (Exit) 120/72    Heart Rate (Admit) 72 bpm    Heart Rate (Exercise) 101 bpm    Heart Rate (Exit) 79  bpm    Rating of Perceived Exertion (Exercise) 12    Symptoms none    Duration Continue with 30 min of aerobic exercise without signs/symptoms of physical distress.    Intensity THRR unchanged      Progression   Progression Continue to progress workloads to maintain intensity without signs/symptoms of physical distress.    Average METs 2.8      Resistance Training   Training Prescription No  Interval Training   Interval Training No      NuStep   Level 4    SPM 85    Minutes 15    METs 2.4      Track   Laps 19    Minutes 15    METs 3.2      Home Exercise Plan   Plans to continue exercise at Home (comment)   Walking   Frequency Add 3 additional days to program exercise sessions.    Initial Home Exercises Provided 06/04/20           Nutrition:  Target Goals: Understanding of nutrition guidelines, daily intake of sodium 1500mg , cholesterol 200mg , calories 30% from fat and 7% or less from saturated fats, daily to have 5 or more servings of fruits and vegetables.  Biometrics:  Pre Biometrics - 05/13/20 0803      Pre Biometrics   Waist Circumference 43.5 inches    Hip Circumference 47.5 inches    Waist to Hip Ratio 0.92 %    Triceps Skinfold 14.5 mm    % Body Fat 30.8 %    Grip Strength 43.5 kg    Flexibility 15.25 in    Single Leg Stand 1.43 seconds            Nutrition Therapy Plan and Nutrition Goals:  Nutrition Therapy & Goals - 05/23/20 1020      Nutrition Therapy   Diet Heart healthy    Drug/Food Interactions Statins/Certain Fruits      Personal Nutrition Goals   Nutrition Goal Pt to build a healthy plate including vegetables, fruits, whole grains, and low-fat dairy products in a heart healthy meal plan.    Personal Goal #2 Pt to identify food quantities necessary to achieve weight loss of 6-24 lb at graduation from cardiac rehab.      Intervention Plan   Intervention Prescribe, educate and counsel regarding individualized specific dietary  modifications aiming towards targeted core components such as weight, hypertension, lipid management, diabetes, heart failure and other comorbidities.;Nutrition handout(s) given to patient.    Expected Outcomes Short Term Goal: A plan has been developed with personal nutrition goals set during dietitian appointment.           Nutrition Assessments:   Nutrition Goals Re-Evaluation:  Nutrition Goals Re-Evaluation    Row Name 05/23/20 1021 06/03/20 0948           Goals   Current Weight 240 lb (108.9 kg) 237 lb 10.5 oz (107.8 kg)      Nutrition Goal Pt to build a healthy plate including vegetables, fruits, whole grains, and low-fat dairy products in a heart healthy meal plan. --        Personal Goal #2 Re-Evaluation   Personal Goal #2 Pt to identify food quantities necessary to achieve weight loss of 6-24 lb at graduation from cardiac rehab. --             Nutrition Goals Re-Evaluation:  Nutrition Goals Re-Evaluation    Row Name 05/23/20 1021 06/03/20 0948           Goals   Current Weight 240 lb (108.9 kg) 237 lb 10.5 oz (107.8 kg)      Nutrition Goal Pt to build a healthy plate including vegetables, fruits, whole grains, and low-fat dairy products in a heart healthy meal plan. --        Personal Goal #2 Re-Evaluation   Personal Goal #2 Pt to identify food quantities necessary to achieve weight  loss of 6-24 lb at graduation from cardiac rehab. --             Nutrition Goals Discharge (Final Nutrition Goals Re-Evaluation):  Nutrition Goals Re-Evaluation - 06/03/20 0948      Goals   Current Weight 237 lb 10.5 oz (107.8 kg)           Psychosocial: Target Goals: Acknowledge presence or absence of significant depression and/or stress, maximize coping skills, provide positive support system. Participant is able to verbalize types and ability to use techniques and skills needed for reducing stress and depression.  Initial Review & Psychosocial Screening:  Initial  Psych Review & Screening - 05/13/20 1003      Initial Review   Current issues with History of Depression;Current Stress Concerns    Source of Stress Concerns Occupation    Comments Mr. Gossen presents to cardiac rehab orientation with a positive attitude and outlook. He is eager to recieve education on exercise and risk factor modifications. He does have a history of depression and admits to having depression symptoms for the first few weeks after his intervention however those feelings have deminished. He is married and has grown children and step children. Feels his wife could be more understanding that change related to diet and exercise is hard to make all at once. He feels some stress as it related to his work. Feels unsupported by his co-workers. States his co-workers do not understand lifting restrictions or safety as it relates to brusing and bleeding from brillinta if he is not careful. 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 Denies psychosocial barriers to self health management or participation in CR. No interventions needed at this time.      Family Dynamics   Good Support System? Yes      Barriers   Psychosocial barriers to participate in program The patient should benefit from training in stress management and relaxation.;There are no identifiable barriers or psychosocial needs.   provided patient with stress management tools via written education materials     Screening Interventions   Interventions Encouraged to exercise           Quality of Life Scores:  Scores of 19 and below usually indicate a poorer quality of life in these areas.  A difference of  2-3 points is a clinically meaningful difference.  A difference of 2-3 points in the total score of the Quality of Life Index has been associated with significant improvement in overall quality of life, self-image, physical symptoms, and general health in studies assessing change in quality of  life.  PHQ-9: Recent Review Flowsheet Data    Depression screen [facility] 2/9 05/13/2020   Decreased Interest 0   Down, Depressed, Hopeless 0   PHQ - 2 Score 0     Interpretation of Total Score  Total Score Depression Severity:  1-4 = Minimal depression, 5-9 = Mild depression, 10-14 = Moderate depression, 15-19 = Moderately severe depression, 20-27 = Severe depression   Psychosocial Evaluation and Intervention:  Psychosocial Evaluation - 05/19/20 1236      Psychosocial Evaluation & Interventions   Comments Mr. Mandella presents to cardiac rehab orientation with a positive attitude and outlook. He is eager to recieve education on exercise and risk factor modifications. He does have a history of depression and admits to having depression symptoms for the first few weeks after his intervention however those feelings have deminished. He utilizes his children and grandchildren as motivation to  better his health. He is married and has grown children and step children. Feels his wife could be more understanding that change related to diet and exercise is hard to make all at once. He feels some stress as it related to his work. Feels unsupported by his co-workers. States his co-workers do not understand lifting restrictions or safety as it relates to brusing and bleeding from brillinta if he is not careful. 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 Denies psychosocial barriers to self health management or participation in CR. No interventions needed at this time.    Expected Outcomes Mr. Kluth will continue to have a positive attitude and outlook. He will continue to monitor his stress levels and request interventions for stress management as needed.    Continue Psychosocial Services  Follow up required by staff           Psychosocial Re-Evaluation:  Psychosocial Re-Evaluation    Row Name 06/03/20 1517             Psychosocial Re-Evaluation   Current issues with History of  Depression;Current Stress Concerns       Comments Mr. Rothlisberger presents to cardiac rehab with a positive attitude and outlook. He does have a history of depression and admits to having depression symptoms for the first few weeks after his intervention however those feelings have deminished. He utilizes his children and grandchildren as motivation to better his health. He is married and has grown children and step children. He feels some stress as it related to his work. Feels unsupported by his co-workers. States his co-workers do not understand lifting restrictions or safety as it relates to brusing and bleeding from brillinta if he is not careful. 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 Denies psychosocial barriers to self health management or participation in CR. No interventions needed at this time.       Expected Outcomes Mr. Heacock will continue to have a positive outlook and attitude. 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       Interventions Encouraged to attend Cardiac Rehabilitation for the exercise       Continue Psychosocial Services  Follow up required by staff              Psychosocial Discharge (Final Psychosocial Re-Evaluation):  Psychosocial Re-Evaluation - 06/03/20 1517      Psychosocial Re-Evaluation   Current issues with History of Depression;Current Stress Concerns    Comments Mr. Lama presents to cardiac rehab with a positive attitude and outlook. He does have a history of depression and admits to having depression symptoms for the first few weeks after his intervention however those feelings have deminished. He utilizes his children and grandchildren as motivation to better his health. He is married and has grown children and step children. He feels some stress as it related to his work. Feels unsupported by his co-workers. States his co-workers do not understand lifting restrictions or safety as it relates to brusing and bleeding  from brillinta if he is not careful. 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 Denies psychosocial barriers to self health management or participation in CR. No interventions needed at this time.    Expected Outcomes Mr. Leyda will continue to have a positive outlook and attitude. 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    Interventions Encouraged to attend Cardiac Rehabilitation for the exercise  Continue Psychosocial Services  Follow up required by staff           Vocational Rehabilitation: Provide vocational rehab assistance to qualifying candidates.   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 Participant will state understanding/return demonstration of topics presented.  Learning Barriers/Preferences:   Education Topics: Count Your Pulse:  -Group instruction provided by verbal instruction, demonstration, patient participation and written materials to support subject.  Instructors address importance of being able to find your pulse and how to count your pulse when at home without a heart monitor.  Patients get hands on experience counting their pulse with staff help and individually.   Heart Attack, Angina, and Risk Factor Modification:  -Group instruction provided by verbal instruction, video, and written materials to support subject.  Instructors address signs and symptoms of angina and heart attacks.    Also discuss risk factors for heart disease and how to make changes to improve heart health risk factors.   Functional Fitness:  -Group instruction provided by  verbal instruction, demonstration, patient participation, and written materials to support subject.  Instructors address safety measures for doing things around the house.  Discuss how to get up and down off the floor, how to pick things up properly, how to safely get out of a chair without assistance, and balance training.   Meditation and Mindfulness:  -Group instruction provided by verbal instruction, patient participation, and written materials to support subject.  Instructor addresses importance of mindfulness and meditation practice to help reduce stress and improve awareness.  Instructor also leads participants through a meditation exercise.    Stretching for Flexibility and Mobility:  -Group instruction provided by verbal instruction, patient participation, and written materials to support subject.  Instructors lead participants through series of stretches that are designed to increase flexibility thus improving mobility.  These stretches are additional exercise for major muscle groups that are typically performed during regular warm up and cool down.   Hands Only CPR:  -Group verbal, video, and participation provides a basic overview of AHA guidelines for community CPR. Role-play of emergencies allow participants the opportunity to practice calling for help and chest compression technique with discussion of AED use.   Hypertension: -Group verbal and written instruction that provides a basic overview of hypertension including the most recent diagnostic guidelines, risk factor reduction with self-care instructions and medication management.    Nutrition I class: Heart Healthy Eating:  -Group instruction provided by PowerPoint slides, verbal discussion, and written materials to support subject matter. The instructor gives an explanation and review of the Therapeutic Lifestyle Changes diet recommendations, which includes a discussion on lipid goals, dietary fat, sodium, fiber, plant  stanol/sterol esters, sugar, and the components of a well-balanced, healthy diet.   Nutrition II class: Lifestyle Skills:  -Group instruction provided by PowerPoint slides, verbal discussion, and written materials to support subject matter. The instructor gives an explanation and review of label reading, grocery shopping for heart health, heart healthy recipe modifications, and ways to make healthier choices when eating out.   Diabetes Question & Answer:  -Group instruction provided by PowerPoint slides,  verbal discussion, and written materials to support subject matter. The instructor gives an explanation and review of diabetes co-morbidities, pre- and post-prandial blood glucose goals, pre-exercise blood glucose goals, signs, symptoms, and treatment of hypoglycemia and hyperglycemia, and foot care basics.   Diabetes Blitz:  -Group instruction provided by PowerPoint slides, verbal discussion, and written materials to support subject matter. The instructor gives an explanation and review of the physiology behind type 1 and type 2 diabetes, diabetes medications and rational behind using different medications, pre- and post-prandial blood glucose recommendations and Hemoglobin A1c goals, diabetes diet, and exercise including blood glucose guidelines for exercising safely.    Portion Distortion:  -Group instruction provided by PowerPoint slides, verbal discussion, written materials, and food models to support subject matter. The instructor gives an explanation of serving size versus portion size, changes in portions sizes over the last 20 years, and what consists of a serving from each food group.   Stress Management:  -Group instruction provided by verbal instruction, video, and written materials to support subject matter.  Instructors review role of stress in heart disease and how to cope with stress positively.     Exercising on Your Own:  -Group instruction provided by verbal instruction,  power point, and written materials to support subject.  Instructors discuss benefits of exercise, components of exercise, frequency and intensity of exercise, and end points for exercise.  Also discuss use of nitroglycerin and activating EMS.  Review options of places to exercise outside of rehab.  Review guidelines for sex with heart disease.   Cardiac Drugs I:  -Group instruction provided by verbal instruction and written materials to support subject.  Instructor reviews cardiac drug classes: antiplatelets, anticoagulants, beta blockers, and statins.  Instructor discusses reasons, side effects, and lifestyle considerations for each drug class.   Cardiac Drugs II:  -Group instruction provided by verbal instruction and written materials to support subject.  Instructor reviews cardiac drug classes: angiotensin converting enzyme inhibitors (ACE-I), angiotensin II receptor blockers (ARBs), nitrates, and calcium channel blockers.  Instructor discusses reasons, side effects, and lifestyle considerations for each drug class.   Anatomy and Physiology of the Circulatory System:  Group verbal and written instruction and models provide basic cardiac anatomy and physiology, with the coronary electrical and arterial systems. Review of: AMI, Angina, Valve disease, Heart Failure, Peripheral Artery Disease, Cardiac Arrhythmia, Pacemakers, and the ICD.   Other Education:  -Group or individual verbal, written, or video instructions that support the educational goals of the cardiac rehab program.   Holiday Eating Survival Tips:  -Group instruction provided by PowerPoint slides, verbal discussion, and written materials to support subject matter. The instructor gives patients tips, tricks, and techniques to help them not only survive but enjoy the holidays despite the onslaught of food that accompanies the holidays.   Knowledge Questionnaire Score:   Core Components/Risk Factors/Patient Goals at Admission:   Personal Goals and Risk Factors at Admission - 05/13/20 1009      Core Components/Risk Factors/Patient Goals on Admission    Weight Management Yes;Obesity    Intervention Weight Management/Obesity: Establish reasonable short term and long term weight goals.;Obesity: Provide education and appropriate resources to help participant work on and attain dietary goals.    Admit Weight 240 lb 4.8 oz (109 kg)    Expected Outcomes Short Term: Continue to assess and modify interventions until short term weight is achieved;Long Term: Adherence to nutrition and physical activity/exercise program aimed toward attainment of established weight goal;Weight Loss: Understanding of general recommendations  for a balanced deficit meal plan, which promotes 1-2 lb weight loss per week and includes a negative energy balance of [phone] kcal/d;Understanding recommendations for meals to include 15-35% energy as protein, 25-35% energy from fat, 35-60% energy from carbohydrates, less than 200mg  of dietary cholesterol, 20-35 gm of total fiber daily;Understanding of distribution of calorie intake throughout the day with the consumption of 4-5 meals/snacks    Hypertension Yes    Intervention Provide education on lifestyle modifcations including regular physical activity/exercise, weight management, moderate sodium restriction and increased consumption of fresh fruit, vegetables, and low fat dairy, alcohol moderation, and smoking cessation.;Monitor prescription use compliance.    Expected Outcomes Short Term: Continued assessment and intervention until BP is < 140/88mm HG in hypertensive participants. < 130/49mm HG in hypertensive participants with diabetes, heart failure or chronic kidney disease.;Long Term: Maintenance of blood pressure at goal levels.    Lipids Yes    Intervention Provide education and support for participant on nutrition & aerobic/resistive exercise along with prescribed medications to achieve LDL 70mg , HDL >40mg .      Expected Outcomes Short Term: Participant states understanding of desired cholesterol values and is compliant with medications prescribed. Participant is following exercise prescription and nutrition guidelines.;Long Term: Cholesterol controlled with medications as prescribed, with individualized exercise RX and with personalized nutrition plan. Value goals: LDL < 70mg , HDL > 40 mg.    Stress Yes    Intervention Offer individual and/or small group education and counseling on adjustment to heart disease, stress management and health-related lifestyle change. Teach and support self-help strategies.;Refer participants experiencing significant psychosocial distress to appropriate mental health specialists for further evaluation and treatment. When possible, include family members and significant others in education/counseling sessions.    Expected Outcomes Short Term: Participant demonstrates changes in health-related behavior, relaxation and other stress management skills, ability to obtain effective social support, and compliance with psychotropic medications if prescribed.;Long Term: Emotional wellbeing is indicated by absence of clinically significant psychosocial distress or social isolation.           Core Components/Risk Factors/Patient Goals Review:   Goals and Risk Factor Review    Row Name 05/19/20 1239 06/03/20 1519           Core Components/Risk Factors/Patient Goals Review   Personal Goals Review Weight Management/Obesity;Lipids;Stress;Hypertension Weight Management/Obesity;Lipids;Stress;Hypertension      Review Mr. Kidane has multiple CAD risk factors. He is eager to participate in CR for risk factor modification. His goals are to be able to complete his ADLs with better ease, get back to walking and hiking, and extend the longivity of his life for his children and grandchildren. Mr. Klee has multiple CAD risk factors. He is eager to participate in CR for risk factor  modification. His goals are to be able to complete his ADLs with better ease, get back to walking and hiking, and extend the longivity of his life for his children and grandchildren.      Expected Outcomes Patient will continue to participate in CR for risk factor modification. Patient will continue to participate in CR for risk factor modification.             Core Components/Risk Factors/Patient Goals at Discharge (Final Review):   Goals and Risk Factor Review - 06/03/20 1519      Core Components/Risk Factors/Patient Goals Review   Personal Goals Review Weight Management/Obesity;Lipids;Stress;Hypertension    Review Mr. Maryland has multiple CAD risk factors. He is eager to participate in CR for risk factor modification. His  goals are to be able to complete his ADLs with better ease, get back to walking and hiking, and extend the longivity of his life for his children and grandchildren.    Expected Outcomes Patient will continue to participate in CR for risk factor modification.           ITP Comments:  ITP Comments    Row Name 05/13/20 0804 05/19/20 1234 06/03/20 1512       ITP Comments Dr. Armanda Magic Medical Director [facility] Mr. Croy completed his first cardiac rehab exercise session today and tolerated very well. Worked to an RPE of 9-11. VSS. Denied complaints. 30 day ITP review: Mr. Niblack has completed 6 cardiac rehab exercise sessions since admission. He works to an RPE of 3.0 to 3.4. VSS. Intermitantly complains of sciatic pain with ambulation however he is able to work through it. Stretching significantly lessens the pain. His goals are to get back to walking and hiking and improve his longivity so he can enjoy his grandchildren.            Comments: see ITP comments

## 2020-06-04 ENCOUNTER — Other Ambulatory Visit: Payer: Self-pay

## 2020-06-04 ENCOUNTER — Encounter (HOSPITAL_COMMUNITY)
Admission: RE | Admit: 2020-06-04 | Discharge: 2020-06-04 | Disposition: A | Discharge status: Home / Self Care | Payer: 59 | Source: Ambulatory Visit | Attending: Cardiology | Admitting: Cardiology

## 2020-06-04 DIAGNOSIS — Z9861 Coronary angioplasty status: Secondary | ICD-10-CM

## 2020-06-04 DIAGNOSIS — I2510 Atherosclerotic heart disease of native coronary artery without angina pectoris: Secondary | ICD-10-CM

## 2020-06-04 DIAGNOSIS — Z955 Presence of coronary angioplasty implant and graft: Secondary | ICD-10-CM

## 2020-06-04 NOTE — Progress Notes (Signed)
Reviewed home exercise guidelines with patient including endpoints, temperature precautions, target heart rate and rate of perceived exertion. Pt is walking 15-30 minutes, 3 days/week as his mode of home exercise. Pt voices understanding of instructions given.  Artist Pais, MS, ACSM CEP

## 2020-06-06 ENCOUNTER — Encounter (HOSPITAL_COMMUNITY)
Admission: RE | Admit: 2020-06-06 | Discharge: 2020-06-06 | Disposition: A | Discharge status: Home / Self Care | Payer: 59 | Source: Ambulatory Visit | Attending: Cardiology | Admitting: Cardiology

## 2020-06-06 ENCOUNTER — Other Ambulatory Visit: Payer: Self-pay

## 2020-06-06 DIAGNOSIS — Z9861 Coronary angioplasty status: Secondary | ICD-10-CM

## 2020-06-06 DIAGNOSIS — Z955 Presence of coronary angioplasty implant and graft: Secondary | ICD-10-CM

## 2020-06-06 DIAGNOSIS — I2510 Atherosclerotic heart disease of native coronary artery without angina pectoris: Secondary | ICD-10-CM

## 2020-06-09 ENCOUNTER — Encounter (HOSPITAL_COMMUNITY)
Admission: RE | Admit: 2020-06-09 | Discharge: 2020-06-09 | Disposition: A | Discharge status: Home / Self Care | Payer: 59 | Source: Ambulatory Visit | Attending: Cardiology | Admitting: Cardiology

## 2020-06-09 ENCOUNTER — Other Ambulatory Visit: Payer: Self-pay

## 2020-06-09 DIAGNOSIS — I2510 Atherosclerotic heart disease of native coronary artery without angina pectoris: Secondary | ICD-10-CM | POA: Diagnosis not present

## 2020-06-09 DIAGNOSIS — Z9861 Coronary angioplasty status: Secondary | ICD-10-CM

## 2020-06-09 DIAGNOSIS — Z955 Presence of coronary angioplasty implant and graft: Secondary | ICD-10-CM

## 2020-06-11 ENCOUNTER — Encounter (HOSPITAL_COMMUNITY)
Admission: RE | Admit: 2020-06-11 | Discharge: 2020-06-11 | Disposition: A | Discharge status: Home / Self Care | Payer: 59 | Source: Ambulatory Visit | Attending: Cardiology | Admitting: Cardiology

## 2020-06-11 ENCOUNTER — Other Ambulatory Visit: Payer: Self-pay

## 2020-06-11 DIAGNOSIS — I2510 Atherosclerotic heart disease of native coronary artery without angina pectoris: Secondary | ICD-10-CM | POA: Diagnosis not present

## 2020-06-11 DIAGNOSIS — Z955 Presence of coronary angioplasty implant and graft: Secondary | ICD-10-CM

## 2020-06-11 NOTE — Progress Notes (Signed)
Nutrition Note - Follow Up  Spoke with pt.  Inadequate intake as pt is trying to lose weight.  He is eating fruit for lunch and protein/veggie for dinner. No snacks during the day. He has noticed increased snacking and portions at night and feels discouraged. He feels lethargic during the day. Very hungry by night time. Discussed ways to incorporate breakfast and lunch. Pt amenable. Education on plate method and heart healthy goals. Provided handouts for reinforcement.   Pt also states he had his A1C done by PCP recently. He says it was under 5.7.  Pt verbalized understanding.  Will monitor pt during time in cardiac rehab.  Andrey Campanile, MS, RDN, LDN

## 2020-06-13 ENCOUNTER — Encounter (HOSPITAL_COMMUNITY)
Admission: RE | Admit: 2020-06-13 | Discharge: 2020-06-13 | Disposition: A | Discharge status: Home / Self Care | Payer: 59 | Source: Ambulatory Visit | Attending: Cardiology | Admitting: Cardiology

## 2020-06-13 ENCOUNTER — Other Ambulatory Visit: Payer: Self-pay

## 2020-06-13 DIAGNOSIS — I2510 Atherosclerotic heart disease of native coronary artery without angina pectoris: Secondary | ICD-10-CM | POA: Diagnosis not present

## 2020-06-13 DIAGNOSIS — Z9861 Coronary angioplasty status: Secondary | ICD-10-CM

## 2020-06-13 DIAGNOSIS — Z955 Presence of coronary angioplasty implant and graft: Secondary | ICD-10-CM

## 2020-06-16 ENCOUNTER — Encounter (HOSPITAL_COMMUNITY)
Admission: RE | Admit: 2020-06-16 | Discharge: 2020-06-16 | Disposition: A | Discharge status: Home / Self Care | Payer: 59 | Source: Ambulatory Visit | Attending: Cardiology | Admitting: Cardiology

## 2020-06-16 ENCOUNTER — Other Ambulatory Visit: Payer: Self-pay

## 2020-06-16 DIAGNOSIS — I2510 Atherosclerotic heart disease of native coronary artery without angina pectoris: Secondary | ICD-10-CM | POA: Diagnosis not present

## 2020-06-16 DIAGNOSIS — Z9861 Coronary angioplasty status: Secondary | ICD-10-CM

## 2020-06-16 DIAGNOSIS — Z955 Presence of coronary angioplasty implant and graft: Secondary | ICD-10-CM

## 2020-06-18 ENCOUNTER — Encounter (HOSPITAL_COMMUNITY)
Admission: RE | Admit: 2020-06-18 | Discharge: 2020-06-18 | Disposition: A | Discharge status: Home / Self Care | Payer: 59 | Source: Ambulatory Visit | Attending: Cardiology | Admitting: Cardiology

## 2020-06-18 ENCOUNTER — Other Ambulatory Visit: Payer: Self-pay

## 2020-06-18 DIAGNOSIS — I2510 Atherosclerotic heart disease of native coronary artery without angina pectoris: Secondary | ICD-10-CM

## 2020-06-18 DIAGNOSIS — Z955 Presence of coronary angioplasty implant and graft: Secondary | ICD-10-CM

## 2020-06-19 NOTE — Progress Notes (Deleted)
Cardiology Office Note   Date:  06/19/2020   ID:  Stephen Scott, DOB [birth year], MRN [id number]  PCP:  Aliene Beams, MD  Cardiologist:   Misao Fackrell [place], MD   No chief complaint on file.     History of Present Illness: Stephen Scott is a 59 y.o. male who is seen for follow up CAD. He has a history of HTN and HLD. He presented in June with unstable angina. He underwent cardiac cath showing severe LCx disease successfully treated with DES.    He does note that he has been under a lot of stress. Feels very depressed and anxious. Has no drive. He has lumbar stenosis which limits his walking. BP has been high and amlodipine just increased last week. He has a strong family history of CAD.    Past Medical History:  Diagnosis Date  . Anemia    as a small child  . Anxiety   . Arthritis    "lower spine; knees" (11/01/2017)  . Chronic lower back pain   . Depression   . Family history of adverse reaction to anesthesia    "daughter PONV"  . GERD (gastroesophageal reflux disease)   . High cholesterol   . History of kidney stones   . Hypertension   . Pneumonia 06/2017; 08/2017   walking pneumonia; treated  . Spinal stenosis    "lower back" (11/01/2017)    Past Surgical History:  Procedure Laterality Date  . CORONARY STENT INTERVENTION N/A 03/04/2020   Procedure: CORONARY STENT INTERVENTION;  Surgeon: [place], Naksh Radi M, MD;  Location: [facility] INVASIVE CV LAB;  Service: Cardiovascular;  Laterality: N/A;  . HAND RECONSTRUCTION Left 1989   "injured when washing dishes"  . INTRAVASCULAR PRESSURE WIRE/FFR STUDY N/A 03/04/2020   Procedure: INTRAVASCULAR PRESSURE WIRE/FFR STUDY;  Surgeon: [place], Heyward Douthit M, MD;  Location: [facility] INVASIVE CV LAB;  Service: Cardiovascular;  Laterality: N/A;  . INTRAVASCULAR ULTRASOUND/IVUS N/A 03/04/2020   Procedure: Intravascular Ultrasound/IVUS;  Surgeon: [place], Mardee Clune M, MD;  Location: [facility] INVASIVE CV LAB;  Service: Cardiovascular;  Laterality: N/A;  . JOINT REPLACEMENT    .  KNEE ARTHROSCOPY Right 2008  . LEFT HEART CATH AND CORONARY ANGIOGRAPHY N/A 03/04/2020   Procedure: LEFT HEART CATH AND CORONARY ANGIOGRAPHY;  Surgeon: [place], Chantrice Hagg M, MD;  Location: [facility] INVASIVE CV LAB;  Service: Cardiovascular;  Laterality: N/A;  . TOTAL KNEE ARTHROPLASTY Right 11/01/2017  . TOTAL KNEE ARTHROPLASTY Right 11/01/2017   Procedure: RIGHT TOTAL KNEE ARTHROPLASTY;  Surgeon: Kathryne Hitch, MD;  Location: [facility] OR;  Service: Orthopedics;  Laterality: Right;  . WISDOM TOOTH EXTRACTION  1981     Current Outpatient Medications  Medication Sig Dispense Refill  . amLODipine (NORVASC) 10 MG tablet Take 10 mg by mouth daily.    Marland Kitchen aspirin EC 81 MG tablet Take 1 tablet (81 mg total) by mouth daily. 90 tablet 3  . atorvastatin (LIPITOR) 80 MG tablet Take 1 tablet (80 mg total) by mouth daily. 90 tablet 3  . buPROPion (WELLBUTRIN XL) 300 MG 24 hr tablet Take 300 mg by mouth daily.     . famotidine (PEPCID) 20 MG tablet Take 20 mg by mouth at bedtime as needed for heartburn.  (Patient not taking: Reported on 05/13/2020)    . gabapentin (NEURONTIN) 300 MG capsule Take 300 mg by mouth at bedtime.    Marland Kitchen loratadine (CLARITIN) 10 MG tablet Take 10 mg by mouth daily as needed for allergies. (Patient not taking: Reported on 05/13/2020)    .  losartan (COZAAR) 100 MG tablet Take 100 mg by mouth daily.    . methocarbamol (ROBAXIN) 500 MG tablet Take 500 mg by mouth every 8 (eight) hours as needed for muscle spasms. (Patient not taking: Reported on 05/13/2020)    . nitroGLYCERIN (NITROSTAT) 0.4 MG SL tablet Place 1 tablet (0.4 mg total) under the tongue every 5 (five) minutes as needed for chest pain. (Patient not taking: Reported on 05/13/2020) 90 tablet 3  . Polyethyl Glycol-Propyl Glycol (SYSTANE OP) Place 1 drop into both eyes daily as needed (dry eyes).    . sertraline (ZOLOFT) 100 MG tablet Take 100 mg by mouth daily.    . ticagrelor (BRILINTA) 90 MG TABS tablet Take 1 tablet (90 mg total) by mouth 2  (two) times daily. 60 tablet 11   No current facility-administered medications for this visit.    Allergies:   Iodides and Tetanus toxoids    Social History:  The patient  reports that he has never smoked. He has never used smokeless tobacco. He reports current alcohol use of about 3.0 standard drinks of alcohol per week. He reports that he does not use drugs.   Family History:  The patient's family history includes Clotting disorder in his mother; Diabetes in his mother; Heart attack in his brother; Heart disease in his brother; Hyperlipidemia in his mother; Hypertension in his mother; Pulmonary embolism in his mother.    ROS:  Please see the history of present illness.   Otherwise, review of systems are positive for none.   All other systems are reviewed and negative.    PHYSICAL EXAM: VS:  There were no vitals taken for this visit. , BMI There is no height or weight on file to calculate BMI. GEN: Well nourished, obese, in no acute distress  HEENT: normal  Neck: no JVD, carotid bruits, or masses Cardiac: RRR; no murmurs, rubs, or gallops,no edema  Respiratory:  clear to auscultation bilaterally, normal work of breathing GI: soft, nontender, nondistended, + BS MS: no deformity or atrophy  Skin: warm and dry, no rash Neuro:  Strength and sensation are intact Psych: euthymic mood, full affect   EKG:  EKG is ordered today. The ekg ordered today demonstrates NSR with Normal Ecg. I have personally reviewed and interpreted this study.    Recent Labs: 03/04/2020: BUN 16; Creatinine, Ser 0.86; Hemoglobin 15.8; Platelets 277; Potassium 4.5; Sodium 137    Lipid Panel No results found for: CHOL, TRIG, HDL, CHOLHDL, VLDL, LDLCALC, LDLDIRECT   Wt Readings from Last 3 Encounters:  05/13/20 240 lb 4.8 oz (109 kg)  03/25/20 248 lb (112.5 kg)  03/04/20 258 lb (117 kg)      Other studies Reviewed: Additional studies/ records that were reviewed today include:   Labs dated 02/21/20:  cholesterol 204, triglycerides 133, HDL 44, LDL 136. CBC, chemistries and TSH  Normal. Dated 03/04/20: normal CBC.  Labs dated 05/05/20: A1c 5.2%. cholesterol 119, triglycerides 138, HDL 34, LDL 61. Normal CMET  Cardiac cath 03/04/20:  CORONARY STENT INTERVENTION  INTRAVASCULAR PRESSURE WIRE/FFR STUDY  Intravascular Ultrasound/IVUS  LEFT HEART CATH AND CORONARY ANGIOGRAPHY  Conclusion    Prox Cx to Mid Cx lesion is 80% stenosed.  Post intervention, there is a 0% residual stenosis.  A drug-eluting stent was successfully placed using a STENT RESOLUTE ONYX 3.5X22.  The left ventricular systolic function is normal.  LV end diastolic pressure is normal.  The left ventricular ejection fraction is 55-65% by visual estimate.   1. Single  vessel obstructive CAD involving the LCx. 2. Normal LV function 3. Normal LVEDP 4. Successful PCI of the LCx using FFR and IVUS guidance with DES x 1.  Plan: anticipate same day DC. DAPT with ASA and Brilinta x 1 year.     ASSESSMENT AND PLAN:  1.  CAD s/p DES of LCx in June.  2 . HTN. Continue higher dose of amlodipine 3. Hypercholesterolemia. LDL 136 on pravastatin. Will switch to lipitor 80 mg daily. 4. Family history of CAD 5. Obesity 6. Depression/anxiety. RX as per Dr Tracie Harrier.     Current medicines are reviewed at length with the patient today.  The patient does not have concerns regarding medicines.  The following changes have been made:  See above  Labs/ tests ordered today include:   No orders of the defined types were placed in this encounter.    Disposition:   FU with me post cardiac cath.  Signed, Khari Mally [place], MD  06/19/2020 7:15 AM    [facility] [address] [place], [place], [address] Phone [phone], Fax [phone]

## 2020-06-20 ENCOUNTER — Encounter (HOSPITAL_COMMUNITY)
Admission: RE | Admit: 2020-06-20 | Discharge: 2020-06-20 | Disposition: A | Discharge status: Home / Self Care | Payer: 59 | Source: Ambulatory Visit | Attending: Cardiology | Admitting: Cardiology

## 2020-06-20 ENCOUNTER — Other Ambulatory Visit: Payer: Self-pay

## 2020-06-20 DIAGNOSIS — Z955 Presence of coronary angioplasty implant and graft: Secondary | ICD-10-CM

## 2020-06-20 DIAGNOSIS — Z9861 Coronary angioplasty status: Secondary | ICD-10-CM

## 2020-06-20 DIAGNOSIS — I2510 Atherosclerotic heart disease of native coronary artery without angina pectoris: Secondary | ICD-10-CM | POA: Diagnosis not present

## 2020-06-23 ENCOUNTER — Encounter (HOSPITAL_COMMUNITY)
Admission: RE | Admit: 2020-06-23 | Discharge: 2020-06-23 | Disposition: A | Discharge status: Home / Self Care | Payer: 59 | Source: Ambulatory Visit | Attending: Cardiology | Admitting: Cardiology

## 2020-06-23 ENCOUNTER — Other Ambulatory Visit: Payer: Self-pay

## 2020-06-23 DIAGNOSIS — Z955 Presence of coronary angioplasty implant and graft: Secondary | ICD-10-CM

## 2020-06-23 DIAGNOSIS — I2510 Atherosclerotic heart disease of native coronary artery without angina pectoris: Secondary | ICD-10-CM

## 2020-06-23 DIAGNOSIS — Z9861 Coronary angioplasty status: Secondary | ICD-10-CM

## 2020-06-23 MED FILL — BRILINTA 90 MG TABLET: 90 | 30 days supply | Qty: 60 | Fill #2

## 2020-06-23 NOTE — Progress Notes (Signed)
Cardiac Individual Treatment Plan  Patient Details  Name: Stephen Scott MRN: [id number] Date of Birth: Jan 04, 1961 Referring Provider:     CARDIAC REHAB PHASE II ORIENTATION from 05/13/2020 in [place]  Referring Provider Martinique, Peter M, MD.      Initial Encounter Date:    CARDIAC REHAB PHASE II ORIENTATION from 05/13/2020 in [place]  Date 05/13/20      Visit Diagnosis: Status post coronary artery stent placement  CAD S/P percutaneous coronary angioplasty  Patient's Home Medications on Admission:  Current Outpatient Medications:  .  amLODipine (NORVASC) 10 MG tablet, Take 10 mg by mouth daily., Disp: , Rfl:  .  aspirin EC 81 MG tablet, Take 1 tablet (81 mg total) by mouth daily., Disp: 90 tablet, Rfl: 3 .  atorvastatin (LIPITOR) 80 MG tablet, Take 1 tablet (80 mg total) by mouth daily., Disp: 90 tablet, Rfl: 3 .  buPROPion (WELLBUTRIN XL) 300 MG 24 hr tablet, Take 300 mg by mouth daily. , Disp: , Rfl:  .  famotidine (PEPCID) 20 MG tablet, Take 20 mg by mouth at bedtime as needed for heartburn.  (Patient not taking: Reported on 05/13/2020), Disp: , Rfl:  .  gabapentin (NEURONTIN) 300 MG capsule, Take 300 mg by mouth at bedtime., Disp: , Rfl:  .  loratadine (CLARITIN) 10 MG tablet, Take 10 mg by mouth daily as needed for allergies. (Patient not taking: Reported on 05/13/2020), Disp: , Rfl:  .  losartan (COZAAR) 100 MG tablet, Take 100 mg by mouth daily., Disp: , Rfl:  .  methocarbamol (ROBAXIN) 500 MG tablet, Take 500 mg by mouth every 8 (eight) hours as needed for muscle spasms. (Patient not taking: Reported on 05/13/2020), Disp: , Rfl:  .  nitroGLYCERIN (NITROSTAT) 0.4 MG SL tablet, Place 1 tablet (0.4 mg total) under the tongue every 5 (five) minutes as needed for chest pain. (Patient not taking: Reported on 05/13/2020), Disp: 90 tablet, Rfl: 3 .  Polyethyl Glycol-Propyl Glycol (SYSTANE OP), Place 1 drop into both eyes  daily as needed (dry eyes)., Disp: , Rfl:  .  sertraline (ZOLOFT) 100 MG tablet, Take 100 mg by mouth daily., Disp: , Rfl:  .  ticagrelor (BRILINTA) 90 MG TABS tablet, Take 1 tablet (90 mg total) by mouth 2 (two) times daily., Disp: 60 tablet, Rfl: 11  Past Medical History: Past Medical History:  Diagnosis Date  . Anemia    as a small child  . Anxiety   . Arthritis    "lower spine; knees" (11/01/2017)  . Chronic lower back pain   . Depression   . Family history of adverse reaction to anesthesia    "daughter PONV"  . GERD (gastroesophageal reflux disease)   . High cholesterol   . History of kidney stones   . Hypertension   . Pneumonia 06/2017; 08/2017   walking pneumonia; treated  . Spinal stenosis    "lower back" (11/01/2017)    Tobacco Use: Social History   Tobacco Use  Smoking Status Never Smoker  Smokeless Tobacco Never Used    Labs: Recent Review Flowsheet Data   There is no flowsheet data to display.     Capillary Blood Glucose: No results found for: GLUCAP   Exercise Target Goals: Exercise Program Goal: Individual exercise prescription set using results from initial 6 min walk test and THRR while considering  patient's activity barriers and safety.   Exercise Prescription Goal: Initial exercise prescription builds to 30-45 minutes  a day of aerobic activity, 2-3 days per week.  Home exercise guidelines will be given to patient during program as part of exercise prescription that the participant will acknowledge.  Activity Barriers & Risk Stratification:  Activity Barriers & Cardiac Risk Stratification - 05/13/20 0822      Activity Barriers & Cardiac Risk Stratification   Activity Barriers History of Falls;Other (comment);Right Knee Replacement    Comments Sciatica, stenosis: lumbar spine    Cardiac Risk Stratification Low           6 Minute Walk:  6 Minute Walk    Row Name 05/13/20 0836         6 Minute Walk   Phase Initial     Distance 1818  feet     Walk Time 6 minutes     # of Rest Breaks 0     MPH 3.44     METS 4.59     RPE 9     Perceived Dyspnea  0     VO2 Peak 16.06     Symptoms Yes (comment)     Comments Patient c/o feeling a "little winded" during the walk test.     Resting HR 90 bpm     Resting BP 142/70     Resting Oxygen Saturation  98 %     Exercise Oxygen Saturation  during 6 min walk 98 %     Max Ex. HR 126 bpm     Max Ex. BP 168/70     2 Minute Post BP 130/78            Oxygen Initial Assessment:   Oxygen Re-Evaluation:   Oxygen Discharge (Final Oxygen Re-Evaluation):   Initial Exercise Prescription:  Initial Exercise Prescription - 05/13/20 1000      Date of Initial Exercise RX and Referring Provider   Date 05/13/20    Referring Provider [place]ique, Peter M, MD.    Expected Discharge Date 07/11/20      NuStep   Level 3    SPM 85    Minutes 15    METs 3      Track   Laps 20    Minutes 15    METs 3.32      Prescription Details   Frequency (times per week) 3    Duration Progress to 30 minutes of continuous aerobic without signs/symptoms of physical distress      Intensity   THRR 40-80% of Max Heartrate 64-128    Ratings of Perceived Exertion 11-13    Perceived Dyspnea 0-4      Progression   Progression Continue to progress workloads to maintain intensity without signs/symptoms of physical distress.      Resistance Training   Training Prescription Yes    Weight 5lbs    Reps 10-15           Perform Capillary Blood Glucose checks as needed.  Exercise Prescription Changes:   Exercise Prescription Changes    Row Name 05/19/20 0710 06/04/20 0703 06/16/20 0705         Response to Exercise   Blood Pressure (Admit) 124/54 110/62 120/56     Blood Pressure (Exercise) 168/58 130/72 144/60     Blood Pressure (Exit) 116/62 120/72 100/62     Heart Rate (Admit) 63 bpm 72 bpm 66 bpm     Heart Rate (Exercise) 116 bpm 101 bpm 118 bpm     Heart Rate (Exit) 67 bpm 79 bpm 75 bpm  Rating of Perceived Exertion (Exercise) 11 12 12      Symptoms none none none     Comments Off to a good start with exercise.  -- --     Duration Continue with 30 min of aerobic exercise without signs/symptoms of physical distress. Continue with 30 min of aerobic exercise without signs/symptoms of physical distress. Continue with 30 min of aerobic exercise without signs/symptoms of physical distress.     Intensity THRR unchanged THRR unchanged THRR unchanged       Progression   Progression Continue to progress workloads to maintain intensity without signs/symptoms of physical distress. Continue to progress workloads to maintain intensity without signs/symptoms of physical distress. Continue to progress workloads to maintain intensity without signs/symptoms of physical distress.     Average METs 3.2 2.8 3.3       Resistance Training   Training Prescription Yes No Yes     Weight 5lbs -- 5lbs     Reps 10-15 -- 10-15     Time 10 Minutes -- 10 Minutes       Interval Training   Interval Training No No --       NuStep   Level 3 4 4      SPM 85 85 85     Minutes 15 15 15      METs 2.7 2.4 3.3       Track   Laps 23 19 20      Minutes 15 15 15      METs 3.67 3.2 3.32       Home Exercise Plan   Plans to continue exercise at -- Home (comment)  Walking Home (comment)  Walking     Frequency -- Add 3 additional days to program exercise sessions. Add 3 additional days to program exercise sessions.     Initial Home Exercises Provided -- 06/04/20 06/04/20            Exercise Comments:   Exercise Comments    Row Name 05/19/20 [phone] 06/04/20 0733 06/18/20 0745       Exercise Comments Patient tolerated first session of exercise well without symptoms. Reviewed home exercise guidelines, METs, and goals with patient. Reviewed METs with patient.            Exercise Goals and Review:   Exercise Goals    Row Name 05/13/20 0827             Exercise Goals   Increase Physical Activity Yes        Intervention Provide advice, education, support and counseling about physical activity/exercise needs.;Develop an individualized exercise prescription for aerobic and resistive training based on initial evaluation findings, risk stratification, comorbidities and participant's personal goals.       Expected Outcomes Short Term: Attend rehab on a regular basis to increase amount of physical activity.;Long Term: Exercising regularly at least 3-5 days a week.;Long Term: Add in home exercise to make exercise part of routine and to increase amount of physical activity.       Increase Strength and Stamina Yes       Intervention Provide advice, education, support and counseling about physical activity/exercise needs.;Develop an individualized exercise prescription for aerobic and resistive training based on initial evaluation findings, risk stratification, comorbidities and participant's personal goals.       Expected Outcomes Short Term: Increase workloads from initial exercise prescription for resistance, speed, and METs.;Short Term: Perform resistance training exercises routinely during rehab and add in resistance training at home;Long Term: Improve cardiorespiratory fitness, muscular  endurance and strength as measured by increased METs and functional capacity (6MWT)       Able to understand and use rate of perceived exertion (RPE) scale Yes       Intervention Provide education and explanation on how to use RPE scale       Expected Outcomes Short Term: Able to use RPE daily in rehab to express subjective intensity level;Long Term:  Able to use RPE to guide intensity level when exercising independently       Knowledge and understanding of Target Heart Rate Range (THRR) Yes       Intervention Provide education and explanation of THRR including how the numbers were predicted and where they are located for reference       Expected Outcomes Short Term: Able to state/look up THRR;Long Term: Able to use THRR to  govern intensity when exercising independently;Short Term: Able to use daily as guideline for intensity in rehab       Able to check pulse independently Yes       Intervention Provide education and demonstration on how to check pulse in carotid and radial arteries.;Review the importance of being able to check your own pulse for safety during independent exercise       Expected Outcomes Short Term: Able to explain why pulse checking is important during independent exercise;Long Term: Able to check pulse independently and accurately       Understanding of Exercise Prescription Yes       Intervention Provide education, explanation, and written materials on patient's individual exercise prescription       Expected Outcomes Short Term: Able to explain program exercise prescription;Long Term: Able to explain home exercise prescription to exercise independently              Exercise Goals Re-Evaluation :  Exercise Goals Re-Evaluation    Row Name 05/19/20 3568 06/04/20 0733           Exercise Goal Re-Evaluation   Exercise Goals Review Increase Physical Activity;Able to understand and use rate of perceived exertion (RPE) scale Increase Physical Activity;Able to understand and use rate of perceived exertion (RPE) scale;Understanding of Exercise Prescription;Increase Strength and Stamina;Knowledge and understanding of Target Heart Rate Range (THRR)      Comments Patient able to use and understand RPE scale appropriately. Reviewed home exercise guidelines with patient including endpoints, temperature precautions, target heart rate and rate of perceived exertion. Pt is walking 15-30 minutes, 3 days/week as his mode of home exercise. Pt voices understanding of instructions given.      Expected Outcomes Increase workloads as tolerated to help achieve personal health and fitness goals. Patient will continue walking at least 3 days/week in addition to exercise at cardiac rehab to help build a consistent routine  that he can continue upon completion of the program.             Discharge Exercise Prescription (Final Exercise Prescription Changes):  Exercise Prescription Changes - 06/16/20 0705      Response to Exercise   Blood Pressure (Admit) 120/56    Blood Pressure (Exercise) 144/60    Blood Pressure (Exit) 100/62    Heart Rate (Admit) 66 bpm    Heart Rate (Exercise) 118 bpm    Heart Rate (Exit) 75 bpm    Rating of Perceived Exertion (Exercise) 12    Symptoms none    Duration Continue with 30 min of aerobic exercise without signs/symptoms of physical distress.    Intensity THRR unchanged  Progression   Progression Continue to progress workloads to maintain intensity without signs/symptoms of physical distress.    Average METs 3.3      Resistance Training   Training Prescription Yes    Weight 5lbs    Reps 10-15    Time 10 Minutes      NuStep   Level 4    SPM 85    Minutes 15    METs 3.3      Track   Laps 20    Minutes 15    METs 3.32      Home Exercise Plan   Plans to continue exercise at Home (comment)   Walking   Frequency Add 3 additional days to program exercise sessions.    Initial Home Exercises Provided 06/04/20           Nutrition:  Target Goals: Understanding of nutrition guidelines, daily intake of sodium '1500mg'$ , cholesterol '200mg'$ , calories 30% from fat and 7% or less from saturated fats, daily to have 5 or more servings of fruits and vegetables.  Biometrics:  Pre Biometrics - 05/13/20 0803      Pre Biometrics   Waist Circumference 43.5 inches    Hip Circumference 47.5 inches    Waist to Hip Ratio 0.92 %    Triceps Skinfold 14.5 mm    % Body Fat 30.8 %    Grip Strength 43.5 kg    Flexibility 15.25 in    Single Leg Stand 1.43 seconds            Nutrition Therapy Plan and Nutrition Goals:  Nutrition Therapy & Goals - 05/23/20 1020      Nutrition Therapy   Diet Heart healthy    Drug/Food Interactions Statins/Certain Fruits       Personal Nutrition Goals   Nutrition Goal Pt to build a healthy plate including vegetables, fruits, whole grains, and low-fat dairy products in a heart healthy meal plan.    Personal Goal #2 Pt to identify food quantities necessary to achieve weight loss of 6-24 lb at graduation from cardiac rehab.      Intervention Plan   Intervention Prescribe, educate and counsel regarding individualized specific dietary modifications aiming towards targeted core components such as weight, hypertension, lipid management, diabetes, heart failure and other comorbidities.;Nutrition handout(s) given to patient.    Expected Outcomes Short Term Goal: A plan has been developed with personal nutrition goals set during dietitian appointment.           Nutrition Assessments:   Nutrition Goals Re-Evaluation:  Nutrition Goals Re-Evaluation    [place] Name 05/23/20 1021 06/03/20 0948 06/24/20 0846         Goals   Current Weight 240 lb (108.9 kg) 237 lb 10.5 oz (107.8 kg) 239 lb 10.2 oz (108.7 kg)     Nutrition Goal Pt to build a healthy plate including vegetables, fruits, whole grains, and low-fat dairy products in a heart healthy meal plan. -- Pt to build a healthy plate including vegetables, fruits, whole grains, and low-fat dairy products in a heart healthy meal plan.     Comment -- -- Reviewed heart healthy nutrition education with pt       Personal Goal #2 Re-Evaluation   Personal Goal #2 Pt to identify food quantities necessary to achieve weight loss of 6-24 lb at graduation from cardiac rehab. -- Pt to identify food quantities necessary to achieve weight loss of 6-24 lb at graduation from cardiac rehab.  Nutrition Goals Re-Evaluation:  Nutrition Goals Re-Evaluation    [place] Name 05/23/20 1021 06/03/20 0948 06/24/20 0846         Goals   Current Weight 240 lb (108.9 kg) 237 lb 10.5 oz (107.8 kg) 239 lb 10.2 oz (108.7 kg)     Nutrition Goal Pt to build a healthy plate including vegetables,  fruits, whole grains, and low-fat dairy products in a heart healthy meal plan. -- Pt to build a healthy plate including vegetables, fruits, whole grains, and low-fat dairy products in a heart healthy meal plan.     Comment -- -- Reviewed heart healthy nutrition education with pt       Personal Goal #2 Re-Evaluation   Personal Goal #2 Pt to identify food quantities necessary to achieve weight loss of 6-24 lb at graduation from cardiac rehab. -- Pt to identify food quantities necessary to achieve weight loss of 6-24 lb at graduation from cardiac rehab.            Nutrition Goals Discharge (Final Nutrition Goals Re-Evaluation):  Nutrition Goals Re-Evaluation - 06/24/20 0846      Goals   Current Weight 239 lb 10.2 oz (108.7 kg)    Nutrition Goal Pt to build a healthy plate including vegetables, fruits, whole grains, and low-fat dairy products in a heart healthy meal plan.    Comment Reviewed heart healthy nutrition education with pt      Personal Goal #2 Re-Evaluation   Personal Goal #2 Pt to identify food quantities necessary to achieve weight loss of 6-24 lb at graduation from cardiac rehab.           Psychosocial: Target Goals: Acknowledge presence or absence of significant depression and/or stress, maximize coping skills, provide positive support system. Participant is able to verbalize types and ability to use techniques and skills needed for reducing stress and depression.  Initial Review & Psychosocial Screening:  Initial Psych Review & Screening - 05/13/20 1003      Initial Review   Current issues with History of Depression;Current Stress Concerns    Source of Stress Concerns Occupation    Comments Mr. Budnick presents to cardiac rehab orientation with a positive attitude and outlook. He is eager to recieve education on exercise and risk factor modifications. He does have a history of depression and admits to having depression symptoms for the first few weeks after his  intervention however those feelings have deminished. He is married and has grown children and step children. Feels his wife could be more understanding that change related to diet and exercise is hard to make all at once. He feels some stress as it related to his work. Feels unsupported by his co-workers. States his co-workers do not understand lifting restrictions or safety as it relates to brusing and bleeding from [place] if he is not careful. 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 Denies psychosocial barriers to self health management or participation in CR. No interventions needed at this time.      Family Dynamics   Good Support System? Yes      Barriers   Psychosocial barriers to participate in program The patient should benefit from training in stress management and relaxation.;There are no identifiable barriers or psychosocial needs.   provided patient with stress management tools via written education materials     Screening Interventions   Interventions Encouraged to exercise           Quality of Life Scores:  Scores of 19 and below usually indicate a poorer quality of life in these areas.  A difference of  2-3 points is a clinically meaningful difference.  A difference of 2-3 points in the total score of the Quality of Life Index has been associated with significant improvement in overall quality of life, self-image, physical symptoms, and general health in studies assessing change in quality of life.  PHQ-9: Recent Review Flowsheet Data    Depression screen [facility] 2/9 05/13/2020   Decreased Interest 0   Down, Depressed, Hopeless 0   PHQ - 2 Score 0     Interpretation of Total Score  Total Score Depression Severity:  1-4 = Minimal depression, 5-9 = Mild depression, 10-14 = Moderate depression, 15-19 = Moderately severe depression, 20-27 = Severe depression   Psychosocial Evaluation and Intervention:  Psychosocial Evaluation - 05/19/20 1236       Psychosocial Evaluation & Interventions   Comments Mr. Rutten presents to cardiac rehab orientation with a positive attitude and outlook. He is eager to recieve education on exercise and risk factor modifications. He does have a history of depression and admits to having depression symptoms for the first few weeks after his intervention however those feelings have deminished. He utilizes his children and grandchildren as motivation to better his health. He is married and has grown children and step children. Feels his wife could be more understanding that change related to diet and exercise is hard to make all at once. He feels some stress as it related to his work. Feels unsupported by his co-workers. States his co-workers do not understand lifting restrictions or safety as it relates to brusing and bleeding from [place] if he is not careful. 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 Denies psychosocial barriers to self health management or participation in CR. No interventions needed at this time.    Expected Outcomes Mr. Saling will continue to have a positive attitude and outlook. He will continue to monitor his stress levels and request interventions for stress management as needed.    Continue Psychosocial Services  Follow up required by staff           Psychosocial Re-Evaluation:  Psychosocial Re-Evaluation    [place] Name 06/03/20 1517 06/23/20 1550           Psychosocial Re-Evaluation   Current issues with History of Depression;Current Stress Concerns History of Depression;Current Stress Concerns      Comments Mr. Clabo presents to cardiac rehab with a positive attitude and outlook. He does have a history of depression and admits to having depression symptoms for the first few weeks after his intervention however those feelings have deminished. He utilizes his children and grandchildren as motivation to better his health. He is married and has grown children and  step children. He feels some stress as it related to his work. Feels unsupported by his co-workers. States his co-workers do not understand lifting restrictions or safety as it relates to brusing and bleeding from [place] if he is not careful. 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 Denies psychosocial barriers to self health management or participation in CR. No interventions needed at this time. Mr. Langham continues to have a positive outlook and attitude. He denies symptoms of depression. His work stress is beginning to [employer] as he grows more confident in his ability to do physical labor. Continues to deny psychosocial barriers to self health management. No interventions needed  at this time.      Expected Outcomes Mr. Bielinski will continue to have a positive outlook and attitude. 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 Mr. Lumpkin will continue to have a positive outlook and attitude. He will notify CR staff if his symptoms of depression return or if his level of stress increases.      Interventions Encouraged to attend Cardiac Rehabilitation for the exercise Encouraged to attend Cardiac Rehabilitation for the exercise      Continue Psychosocial Services  Follow up required by staff No Follow up required             Psychosocial Discharge (Final Psychosocial Re-Evaluation):  Psychosocial Re-Evaluation - 06/23/20 1550      Psychosocial Re-Evaluation   Current issues with History of Depression;Current Stress Concerns    Comments Mr. Dancel continues to have a positive outlook and attitude. He denies symptoms of depression. His work stress is beginning to [employer] as he grows more confident in his ability to do physical labor. Continues to deny psychosocial barriers to self health management. No interventions needed at this time.    Expected Outcomes Mr. Otting will continue to have a positive outlook and attitude. He will notify CR staff if his  symptoms of depression return or if his level of stress increases.    Interventions Encouraged to attend Cardiac Rehabilitation for the exercise    Continue Psychosocial Services  No Follow up required           Vocational Rehabilitation: Provide vocational rehab assistance to qualifying candidates.   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 Participant will state understanding/return demonstration of topics presented.  Learning Barriers/Preferences:   Education Topics: Count Your Pulse:  -Group instruction provided by verbal instruction, demonstration, patient participation and written materials to support subject.  Instructors address importance of being able to find your pulse and how to count your pulse when at home without a heart monitor.  Patients get hands on experience counting their pulse with staff help and individually.   Heart Attack, Angina, and Risk Factor Modification:  -Group instruction provided by verbal instruction, video, and written materials to support subject.  Instructors address signs and symptoms of angina and heart attacks.    Also discuss risk factors for heart disease and how to make changes to improve heart health risk factors.   Functional Fitness:  -Group instruction provided by verbal instruction, demonstration, patient participation, and written materials to support subject.  Instructors address safety measures for doing things around the house.  Discuss how to get up and down off the floor, how to pick things up properly, how to safely get out of a chair without assistance, and  balance training.   Meditation and Mindfulness:  -Group instruction provided by verbal instruction, patient participation, and written materials to support subject.  Instructor addresses importance of mindfulness and meditation practice to help reduce stress and improve awareness.  Instructor also leads participants through a meditation exercise.    Stretching for Flexibility and Mobility:  -Group instruction provided by verbal instruction, patient participation, and written  materials to support subject.  Instructors lead participants through series of stretches that are designed to increase flexibility thus improving mobility.  These stretches are additional exercise for major muscle groups that are typically performed during regular warm up and cool down.   Hands Only CPR:  -Group verbal, video, and participation provides a basic overview of AHA guidelines for community CPR. Role-play of emergencies allow participants the opportunity to practice calling for help and chest compression technique with discussion of AED use.   Hypertension: -Group verbal and written instruction that provides a basic overview of hypertension including the most recent diagnostic guidelines, risk factor reduction with self-care instructions and medication management.    Nutrition I class: Heart Healthy Eating:  -Group instruction provided by PowerPoint slides, verbal discussion, and written materials to support subject matter. The instructor gives an explanation and review of the Therapeutic Lifestyle Changes diet recommendations, which includes a discussion on lipid goals, dietary fat, sodium, fiber, plant stanol/sterol esters, sugar, and the components of a well-balanced, healthy diet.   Nutrition II class: Lifestyle Skills:  -Group instruction provided by PowerPoint slides, verbal discussion, and written materials to support subject matter. The instructor gives an explanation and review of label reading,  grocery shopping for heart health, heart healthy recipe modifications, and ways to make healthier choices when eating out.   Diabetes Question & Answer:  -Group instruction provided by PowerPoint slides, verbal discussion, and written materials to support subject matter. The instructor gives an explanation and review of diabetes co-morbidities, pre- and post-prandial blood glucose goals, pre-exercise blood glucose goals, signs, symptoms, and treatment of hypoglycemia and hyperglycemia, and foot care basics.   Diabetes Blitz:  -Group instruction provided by PowerPoint slides, verbal discussion, and written materials to support subject matter. The instructor gives an explanation and review of the physiology behind type 1 and type 2 diabetes, diabetes medications and rational behind using different medications, pre- and post-prandial blood glucose recommendations and Hemoglobin A1c goals, diabetes diet, and exercise including blood glucose guidelines for exercising safely.    Portion Distortion:  -Group instruction provided by PowerPoint slides, verbal discussion, written materials, and food models to support subject matter. The instructor gives an explanation of serving size versus portion size, changes in portions sizes over the last 20 years, and what consists of a serving from each food group.   Stress Management:  -Group instruction provided by verbal instruction, video, and written materials to support subject matter.  Instructors review role of stress in heart disease and how to cope with stress positively.     Exercising on Your Own:  -Group instruction provided by verbal instruction, power point, and written materials to support subject.  Instructors discuss benefits of exercise, components of exercise, frequency and intensity of exercise, and end points for exercise.  Also discuss use of nitroglycerin and activating EMS.  Review options of places to exercise outside of rehab.  Review  guidelines for sex with heart disease.   Cardiac Drugs I:  -Group instruction provided by verbal instruction and written materials to support subject.  Instructor reviews cardiac drug classes: antiplatelets, anticoagulants, beta blockers, and statins.  Instructor discusses reasons, side effects, and lifestyle considerations for each drug class.   Cardiac Drugs II:  -Group instruction provided by verbal instruction and written materials to support subject.  Instructor reviews cardiac drug classes: angiotensin converting enzyme inhibitors (ACE-I), angiotensin II receptor blockers (ARBs), nitrates, and calcium channel blockers.  Instructor discusses reasons, side effects, and lifestyle considerations for each drug  class.   Anatomy and Physiology of the Circulatory System:  Group verbal and written instruction and models provide basic cardiac anatomy and physiology, with the coronary electrical and arterial systems. Review of: AMI, Angina, Valve disease, Heart Failure, Peripheral Artery Disease, Cardiac Arrhythmia, Pacemakers, and the ICD.   Other Education:  -Group or individual verbal, written, or video instructions that support the educational goals of the cardiac rehab program.   Holiday Eating Survival Tips:  -Group instruction provided by PowerPoint slides, verbal discussion, and written materials to support subject matter. The instructor gives patients tips, tricks, and techniques to help them not only survive but enjoy the holidays despite the onslaught of food that accompanies the holidays.   Knowledge Questionnaire Score:   Core Components/Risk Factors/Patient Goals at Admission:  Personal Goals and Risk Factors at Admission - 05/13/20 1009      Core Components/Risk Factors/Patient Goals on Admission    Weight Management Yes;Obesity    Intervention Weight Management/Obesity: Establish reasonable short term and long term weight goals.;Obesity: Provide education and appropriate  resources to help participant work on and attain dietary goals.    Admit Weight 240 lb 4.8 oz (109 kg)    Expected Outcomes Short Term: Continue to assess and modify interventions until short term weight is achieved;Long Term: Adherence to nutrition and physical activity/exercise program aimed toward attainment of established weight goal;Weight Loss: Understanding of general recommendations for a balanced deficit meal plan, which promotes 1-2 lb weight loss per week and includes a negative energy balance of [phone] kcal/d;Understanding recommendations for meals to include 15-35% energy as protein, 25-35% energy from fat, 35-60% energy from carbohydrates, less than $RemoveB'200mg'ysPrpXYg$  of dietary cholesterol, 20-35 gm of total fiber daily;Understanding of distribution of calorie intake throughout the day with the consumption of 4-5 meals/snacks    Hypertension Yes    Intervention Provide education on lifestyle modifcations including regular physical activity/exercise, weight management, moderate sodium restriction and increased consumption of fresh fruit, vegetables, and low fat dairy, alcohol moderation, and smoking cessation.;Monitor prescription use compliance.    Expected Outcomes Short Term: Continued assessment and intervention until BP is < 140/49mm HG in hypertensive participants. < 130/36mm HG in hypertensive participants with diabetes, heart failure or chronic kidney disease.;Long Term: Maintenance of blood pressure at goal levels.    Lipids Yes    Intervention Provide education and support for participant on nutrition & aerobic/resistive exercise along with prescribed medications to achieve LDL '70mg'$ , HDL >$Remo'40mg'goXEf$ .    Expected Outcomes Short Term: Participant states understanding of desired cholesterol values and is compliant with medications prescribed. Participant is following exercise prescription and nutrition guidelines.;Long Term: Cholesterol controlled with medications as prescribed, with individualized  exercise RX and with personalized nutrition plan. Value goals: LDL < $Rem'70mg'Ubdo$ , HDL > 40 mg.    Stress Yes    Intervention Offer individual and/or small group education and counseling on adjustment to heart disease, stress management and health-related lifestyle change. Teach and support self-help strategies.;Refer participants experiencing significant psychosocial distress to appropriate mental health specialists for further evaluation and treatment. When possible, include family members and significant others in education/counseling sessions.    Expected Outcomes Short Term: Participant demonstrates changes in health-related behavior, relaxation and other stress management skills, ability to obtain effective social support, and compliance with psychotropic medications if prescribed.;Long Term: Emotional wellbeing is indicated by absence of clinically significant psychosocial distress or social isolation.           Core Components/Risk Factors/Patient Goals Review:   Goals and  Risk Factor Review    Row Name 05/19/20 1239 06/03/20 1519 06/23/20 1552         Core Components/Risk Factors/Patient Goals Review   Personal Goals Review Weight Management/Obesity;Lipids;Stress;Hypertension Weight Management/Obesity;Lipids;Stress;Hypertension Weight Management/Obesity;Lipids;Stress;Hypertension     Review Mr. Lema has multiple CAD risk factors. He is eager to participate in CR for risk factor modification. His goals are to be able to complete his ADLs with better ease, get back to walking and hiking, and extend the longivity of his life for his children and grandchildren. Mr. Reichel has multiple CAD risk factors. He is eager to participate in CR for risk factor modification. His goals are to be able to complete his ADLs with better ease, get back to walking and hiking, and extend the longivity of his life for his children and grandchildren. Mr. Vanderveen has multiple CAD risk factors. He continues to be  eager to participate in CR and home exercise for risk factor modification. His goals are to be able to complete his ADLs with better ease, get back to walking and hiking, and extend the longivity of his life for his children and grandchildren. He is on track to meet his goals prior to graduation 07/11/20     Expected Outcomes Patient will continue to participate in CR for risk factor modification. Patient will continue to participate in CR for risk factor modification. Patient will continue to participate in CR for risk factor modification.            Core Components/Risk Factors/Patient Goals at Discharge (Final Review):   Goals and Risk Factor Review - 06/23/20 1552      Core Components/Risk Factors/Patient Goals Review   Personal Goals Review Weight Management/Obesity;Lipids;Stress;Hypertension    Review Mr. Mose has multiple CAD risk factors. He continues to be eager to participate in CR and home exercise for risk factor modification. His goals are to be able to complete his ADLs with better ease, get back to walking and hiking, and extend the longivity of his life for his children and grandchildren. He is on track to meet his goals prior to graduation 07/11/20    Expected Outcomes Patient will continue to participate in CR for risk factor modification.           ITP Comments:  ITP Comments    Row Name 05/13/20 0804 05/19/20 1234 06/03/20 1512 06/23/20 1542     ITP Comments Dr. Fransico Him Medical Director [facility] Mr. Vacha completed his first cardiac rehab exercise session today and tolerated very well. Worked to an RPE of 9-11. VSS. Denied complaints. 30 day ITP review: Mr. Foutz has completed 6 cardiac rehab exercise sessions since admission. He works to an RPE of 3.0 to 3.4. VSS. Intermitantly complains of sciatic pain with ambulation however he is able to work through it. Stretching significantly lessens the pain. His goals are to get back to walking and  hiking and improve his longivity so he can enjoy his grandchildren. 30 day ITP review: Mr. Hazzard has completed 15 cardiac rehab exercise session since admission. He continues to work to an RPE of 10-14. He tolerates workload increases when his RPE is <11. He is reminded to slow his pace when his RPE is reported >13. He has increased his met level on the NuStep from 2.7 to 3.5 and most recently walked 20 laps (200/lap) in 15 min. He has noticed that his sciatic pain with ambulation is beginning to subside. He feels he is getting stronger and  more confident with his ability to exert himself. Work continues to be stressful for him but he is more confident in the amount of physical exertion he can tolerate.           Comments: see ITP comments

## 2020-06-24 ENCOUNTER — Telehealth: Payer: Self-pay | Admitting: Cardiology

## 2020-06-24 NOTE — Telephone Encounter (Signed)
    Pt need to r/s his appt with Dr. Swaziland. No OV available until January. I asked if Virtual appt is ok, he said he needs to ask Dr. Swaziland if Virtual appt is ok for his f/u or he needs to be seen in person.

## 2020-06-24 NOTE — Telephone Encounter (Signed)
Will route to Dr. Elvis Coil nurse to see if virtual would be appropriate for pt.

## 2020-06-25 ENCOUNTER — Other Ambulatory Visit: Payer: Self-pay

## 2020-06-25 ENCOUNTER — Ambulatory Visit: Payer: 59 | Admitting: Cardiology

## 2020-06-25 ENCOUNTER — Encounter (HOSPITAL_COMMUNITY)
Admission: RE | Admit: 2020-06-25 | Discharge: 2020-06-25 | Disposition: A | Discharge status: Home / Self Care | Payer: 59 | Source: Ambulatory Visit | Attending: Cardiology | Admitting: Cardiology

## 2020-06-25 DIAGNOSIS — I2510 Atherosclerotic heart disease of native coronary artery without angina pectoris: Secondary | ICD-10-CM | POA: Diagnosis not present

## 2020-06-25 DIAGNOSIS — Z9861 Coronary angioplasty status: Secondary | ICD-10-CM

## 2020-06-25 DIAGNOSIS — Z955 Presence of coronary angioplasty implant and graft: Secondary | ICD-10-CM

## 2020-06-25 NOTE — Telephone Encounter (Signed)
Spoke to patient advised Dr.Jordan has had a cancellation on tomorrow's schedule.Appointment scheduled 9/30 at 8:00 am.

## 2020-06-25 NOTE — Progress Notes (Signed)
Cardiology Office Note   Date:  06/26/2020   ID:  Stephen Scott, DOB 12-23-1960, MRN [id number]  PCP:  Aliene Beams, MD  Cardiologist:   Namrata Dangler [place], MD   Chief Complaint  Patient presents with  . Coronary Artery Disease      History of Present Illness: Dois Scott is a 59 y.o. male who is seen for follow up CAD. He has a history of HTN and HLD. He presented in June with unstable angina. He underwent cardiac cath showing severe LCx disease successfully treated with DES.    On follow up today he is doing very well. He has made significant lifestyle changes with dietary modification and aerobic activity with Cardiac Rehab. He has lost 40 lbs. He feels so much better. Does note some sinus HA and lightheadedness at times. Has a hammer toe that will eventually need surgery.   Past Medical History:  Diagnosis Date  . Anemia    as a small child  . Anxiety   . Arthritis    "lower spine; knees" (11/01/2017)  . Chronic lower back pain   . Depression   . Family history of adverse reaction to anesthesia    "daughter PONV"  . GERD (gastroesophageal reflux disease)   . High cholesterol   . History of kidney stones   . Hypertension   . Pneumonia 06/2017; 08/2017   walking pneumonia; treated  . Spinal stenosis    "lower back" (11/01/2017)    Past Surgical History:  Procedure Laterality Date  . CORONARY STENT INTERVENTION N/A 03/04/2020   Procedure: CORONARY STENT INTERVENTION;  Surgeon: Swaziland, Sindi Beckworth M, MD;  Location: [facility] INVASIVE CV LAB;  Service: Cardiovascular;  Laterality: N/A;  . HAND RECONSTRUCTION Left 1989   "injured when washing dishes"  . INTRAVASCULAR PRESSURE WIRE/FFR STUDY N/A 03/04/2020   Procedure: INTRAVASCULAR PRESSURE WIRE/FFR STUDY;  Surgeon: Swaziland, Merrick Feutz M, MD;  Location: [facility] INVASIVE CV LAB;  Service: Cardiovascular;  Laterality: N/A;  . INTRAVASCULAR ULTRASOUND/IVUS N/A 03/04/2020   Procedure: Intravascular Ultrasound/IVUS;  Surgeon: Swaziland, Lempi Edwin M, MD;   Location: [facility] INVASIVE CV LAB;  Service: Cardiovascular;  Laterality: N/A;  . JOINT REPLACEMENT    . KNEE ARTHROSCOPY Right 2008  . LEFT HEART CATH AND CORONARY ANGIOGRAPHY N/A 03/04/2020   Procedure: LEFT HEART CATH AND CORONARY ANGIOGRAPHY;  Surgeon: Sw[place]iland, Wenceslao Loper M, MD;  Location: [facility] INVASIVE CV LAB;  Service: Cardiovascular;  Laterality: N/A;  . TOTAL KNEE ARTHROPLASTY Right 11/01/2017  . TOTAL KNEE ARTHROPLASTY Right 11/01/2017   Procedure: RIGHT TOTAL KNEE ARTHROPLASTY;  Surgeon: Kathryne Hitch, MD;  Location: [facility] OR;  Service: Orthopedics;  Laterality: Right;  . WISDOM TOOTH EXTRACTION  1981     Current Outpatient Medications  Medication Sig Dispense Refill  . amLODipine (NORVASC) 10 MG tablet Take 10 mg by mouth daily.    Marland Kitchen aspirin EC 81 MG tablet Take 1 tablet (81 mg total) by mouth daily. 90 tablet 3  . atorvastatin (LIPITOR) 80 MG tablet Take 1 tablet (80 mg total) by mouth daily. 90 tablet 3  . buPROPion (WELLBUTRIN XL) 300 MG 24 hr tablet Take 300 mg by mouth daily.     . famotidine (PEPCID) 20 MG tablet Take 20 mg by mouth at bedtime as needed for heartburn.     . gabapentin (NEURONTIN) 300 MG capsule Take 300 mg by mouth at bedtime.    Marland Kitchen loratadine (CLARITIN) 10 MG tablet Take 10 mg by mouth daily as needed for allergies.     Marland Kitchen  losartan (COZAAR) 100 MG tablet Take 100 mg by mouth daily.    . methocarbamol (ROBAXIN) 500 MG tablet Take 500 mg by mouth every 8 (eight) hours as needed for muscle spasms.     Bertram Gala Glycol-Propyl Glycol (SYSTANE OP) Place 1 drop into both eyes daily as needed (dry eyes).    . sertraline (ZOLOFT) 100 MG tablet Take 100 mg by mouth daily.    . ticagrelor (BRILINTA) 90 MG TABS tablet Take 1 tablet (90 mg total) by mouth 2 (two) times daily. 60 tablet 11  . nitroGLYCERIN (NITROSTAT) 0.4 MG SL tablet Place 1 tablet (0.4 mg total) under the tongue every 5 (five) minutes as needed for chest pain. (Patient not taking: Reported on 05/13/2020) 90  tablet 3   No current facility-administered medications for this visit.    Allergies:   Iodides and Tetanus toxoids    Social History:  The patient  reports that he has never smoked. He has never used smokeless tobacco. He reports current alcohol use of about 3.0 standard drinks of alcohol per week. He reports that he does not use drugs.   Family History:  The patient's family history includes Clotting disorder in his mother; Diabetes in his mother; Heart attack in his brother; Heart disease in his brother; Hyperlipidemia in his mother; Hypertension in his mother; Pulmonary embolism in his mother.    ROS:  Please see the history of present illness.   Otherwise, review of systems are positive for none.   All other systems are reviewed and negative.    PHYSICAL EXAM: VS:  BP 140/72   Pulse 69   Ht 5' 10.5" (1.791 m)   Wt 234 lb (106.1 kg)   SpO2 98%   BMI 33.10 kg/m  , BMI Body mass index is 33.1 kg/m. GEN: Well nourished, overweight, in no acute distress  HEENT: normal  Neck: no JVD, carotid bruits, or masses Cardiac: RRR; no murmurs, rubs, or gallops,no edema  Respiratory:  clear to auscultation bilaterally, normal work of breathing GI: soft, nontender, nondistended, + BS MS: no deformity or atrophy  Skin: warm and dry, no rash Neuro:  Strength and sensation are intact Psych: euthymic mood, full affect   EKG:  EKG is not ordered today.    Recent Labs: 03/04/2020: BUN 16; Creatinine, Ser 0.86; Hemoglobin 15.8; Platelets 277; Potassium 4.5; Sodium 137    Lipid Panel No results found for: CHOL, TRIG, HDL, CHOLHDL, VLDL, LDLCALC, LDLDIRECT   Wt Readings from Last 3 Encounters:  06/26/20 234 lb (106.1 kg)  05/13/20 240 lb 4.8 oz (109 kg)  03/25/20 248 lb (112.5 kg)      Other studies Reviewed: Additional studies/ records that were reviewed today include:   Labs dated 02/21/20: cholesterol 204, triglycerides 133, HDL 44, LDL 136. CBC, chemistries and TSH   Normal. Dated 03/04/20: normal CBC.  Labs dated 05/05/20: A1c 5.2%. cholesterol 119, triglycerides 138, HDL 34, LDL 61. Normal CMET  Cardiac cath 03/04/20:  CORONARY STENT INTERVENTION  INTRAVASCULAR PRESSURE WIRE/FFR STUDY  Intravascular Ultrasound/IVUS  LEFT HEART CATH AND CORONARY ANGIOGRAPHY  Conclusion    Prox Cx to Mid Cx lesion is 80% stenosed.  Post intervention, there is a 0% residual stenosis.  A drug-eluting stent was successfully placed using a STENT RESOLUTE ONYX 3.5X22.  The left ventricular systolic function is normal.  LV end diastolic pressure is normal.  The left ventricular ejection fraction is 55-65% by visual estimate.   1. Single vessel obstructive CAD involving  the LCx. 2. Normal LV function 3. Normal LVEDP 4. Successful PCI of the LCx using FFR and IVUS guidance with DES x 1.  Plan: anticipate same day DC. DAPT with ASA and Brilinta x 1 year.     ASSESSMENT AND PLAN:  1.  CAD s/p DES of LCx in June 2021. He is now asymptomatic.  2 . HTN. Under good control based on Cardiac Rehab.  3. Hypercholesterolemia. LDL 136>>61 on lipitor 80 mg daily and dietary modification. 4. Family history of CAD 5. Obesity encourage continued lifestyle modification. Congratulated on weight loss. 6. Sinus congestion. Recommend loratadine. If no improvement could try Flonase.      Current medicines are reviewed at length with the patient today.  The patient does not have concerns regarding medicines.  The following changes have been made:  See above  Labs/ tests ordered today include:   No orders of the defined types were placed in this encounter.    Disposition:   FU with me 6 months with fasting labs.   Signed, Sana Tessmer Sw[place]iland, MD  06/26/2020 8:35 AM    [facility] [address] [place], [place], [address] Phone [phone], Fax [phone]

## 2020-06-26 ENCOUNTER — Ambulatory Visit (INDEPENDENT_AMBULATORY_CARE_PROVIDER_SITE_OTHER): Payer: 59 | Admitting: Cardiology

## 2020-06-26 ENCOUNTER — Encounter: Payer: Self-pay | Admitting: Cardiology

## 2020-06-26 VITALS — BP 140/72 | HR 69 | Ht 70.5 in | Wt 234.0 lb

## 2020-06-26 DIAGNOSIS — E785 Hyperlipidemia, unspecified: Secondary | ICD-10-CM

## 2020-06-26 DIAGNOSIS — Z9861 Coronary angioplasty status: Secondary | ICD-10-CM | POA: Diagnosis not present

## 2020-06-26 DIAGNOSIS — I10 Essential (primary) hypertension: Secondary | ICD-10-CM

## 2020-06-26 DIAGNOSIS — I2510 Atherosclerotic heart disease of native coronary artery without angina pectoris: Secondary | ICD-10-CM

## 2020-06-27 ENCOUNTER — Encounter (HOSPITAL_COMMUNITY)
Admission: RE | Admit: 2020-06-27 | Discharge: 2020-06-27 | Disposition: A | Discharge status: Home / Self Care | Payer: 59 | Source: Ambulatory Visit | Attending: Cardiology | Admitting: Cardiology

## 2020-06-27 ENCOUNTER — Other Ambulatory Visit: Payer: Self-pay

## 2020-06-27 DIAGNOSIS — Z9861 Coronary angioplasty status: Secondary | ICD-10-CM | POA: Diagnosis present

## 2020-06-27 DIAGNOSIS — Z955 Presence of coronary angioplasty implant and graft: Secondary | ICD-10-CM | POA: Diagnosis present

## 2020-06-27 DIAGNOSIS — I2510 Atherosclerotic heart disease of native coronary artery without angina pectoris: Secondary | ICD-10-CM | POA: Diagnosis present

## 2020-06-30 ENCOUNTER — Encounter (HOSPITAL_COMMUNITY): Payer: 59

## 2020-07-02 ENCOUNTER — Encounter (HOSPITAL_COMMUNITY)
Admission: RE | Admit: 2020-07-02 | Discharge: 2020-07-02 | Disposition: A | Discharge status: Home / Self Care | Payer: 59 | Source: Ambulatory Visit | Attending: Cardiology | Admitting: Cardiology

## 2020-07-02 ENCOUNTER — Other Ambulatory Visit: Payer: Self-pay

## 2020-07-02 DIAGNOSIS — Z9861 Coronary angioplasty status: Secondary | ICD-10-CM

## 2020-07-02 DIAGNOSIS — Z955 Presence of coronary angioplasty implant and graft: Secondary | ICD-10-CM | POA: Diagnosis not present

## 2020-07-02 DIAGNOSIS — I2510 Atherosclerotic heart disease of native coronary artery without angina pectoris: Secondary | ICD-10-CM

## 2020-07-04 ENCOUNTER — Other Ambulatory Visit: Payer: Self-pay

## 2020-07-04 ENCOUNTER — Encounter (HOSPITAL_COMMUNITY)
Admission: RE | Admit: 2020-07-04 | Discharge: 2020-07-04 | Disposition: A | Discharge status: Home / Self Care | Payer: 59 | Source: Ambulatory Visit | Attending: Cardiology | Admitting: Cardiology

## 2020-07-04 DIAGNOSIS — Z955 Presence of coronary angioplasty implant and graft: Secondary | ICD-10-CM | POA: Diagnosis not present

## 2020-07-07 ENCOUNTER — Encounter (HOSPITAL_COMMUNITY)
Admission: RE | Admit: 2020-07-07 | Discharge: 2020-07-07 | Disposition: A | Discharge status: Home / Self Care | Payer: 59 | Source: Ambulatory Visit | Attending: Cardiology | Admitting: Cardiology

## 2020-07-07 ENCOUNTER — Other Ambulatory Visit: Payer: Self-pay

## 2020-07-07 DIAGNOSIS — I2510 Atherosclerotic heart disease of native coronary artery without angina pectoris: Secondary | ICD-10-CM

## 2020-07-07 DIAGNOSIS — Z955 Presence of coronary angioplasty implant and graft: Secondary | ICD-10-CM | POA: Diagnosis not present

## 2020-07-07 DIAGNOSIS — Z9861 Coronary angioplasty status: Secondary | ICD-10-CM

## 2020-07-09 ENCOUNTER — Other Ambulatory Visit: Payer: Self-pay

## 2020-07-09 ENCOUNTER — Encounter (HOSPITAL_COMMUNITY)
Admission: RE | Admit: 2020-07-09 | Discharge: 2020-07-09 | Disposition: A | Discharge status: Home / Self Care | Payer: 59 | Source: Ambulatory Visit | Attending: Cardiology | Admitting: Cardiology

## 2020-07-09 DIAGNOSIS — Z955 Presence of coronary angioplasty implant and graft: Secondary | ICD-10-CM

## 2020-07-09 DIAGNOSIS — I2510 Atherosclerotic heart disease of native coronary artery without angina pectoris: Secondary | ICD-10-CM

## 2020-07-09 NOTE — Progress Notes (Addendum)
Discharge Progress Report  Patient Details  Name: Stephen Scott MRN: [id number] Date of Birth: 01-03-1961 Referring Provider:     CARDIAC REHAB PHASE II ORIENTATION from 05/13/2020 in [facility]  Referring Provider Martinique, Peter M, MD.       Number of Visits: 22 of 24. Patient was discharged from the cardiac rehab exercise and education program 07/11/20.  Reason for Discharge:  Patient reached a stable level of exercise. Patient independent in their exercise. Patient has met program and personal goals.  Smoking History:  Social History   Tobacco Use  Smoking Status Never Smoker  Smokeless Tobacco Never Used    Diagnosis:  Status post coronary artery stent placement  CAD S/P percutaneous coronary angioplasty  ADL UCSD:   Initial Exercise Prescription:  Initial Exercise Prescription - 05/13/20 1000      Date of Initial Exercise RX and Referring Provider   Date 05/13/20    Referring Provider Martinique, Peter M, MD.    Expected Discharge Date 07/11/20      NuStep   Level 3    SPM 85    Minutes 15    METs 3      Track   Laps 20    Minutes 15    METs 3.32      Prescription Details   Frequency (times per week) 3    Duration Progress to 30 minutes of continuous aerobic without signs/symptoms of physical distress      Intensity   THRR 40-80% of Max Heartrate 64-128    Ratings of Perceived Exertion 11-13    Perceived Dyspnea 0-4      Progression   Progression Continue to progress workloads to maintain intensity without signs/symptoms of physical distress.      Resistance Training   Training Prescription Yes    Weight 5lbs    Reps 10-15           Discharge Exercise Prescription (Final Exercise Prescription Changes):  Exercise Prescription Changes - 07/02/20 0707      Response to Exercise   Blood Pressure (Admit) 122/62    Blood Pressure (Exercise) 146/62    Blood Pressure (Exit) 116/58    Heart Rate (Admit) 68 bpm     Heart Rate (Exercise) 117 bpm    Heart Rate (Exit) 76 bpm    Rating of Perceived Exertion (Exercise) 12    Symptoms none    Duration Continue with 30 min of aerobic exercise without signs/symptoms of physical distress.    Intensity THRR unchanged      Progression   Progression Continue to progress workloads to maintain intensity without signs/symptoms of physical distress.    Average METs 3.5      Resistance Training   Training Prescription No      NuStep   Level 5    SPM 85    Minutes 15    METs 3.7      Track   Laps 20    Minutes 15    METs 3.32      Home Exercise Plan   Plans to continue exercise at Home (comment)   Walking   Frequency Add 3 additional days to program exercise sessions.    Initial Home Exercises Provided 06/04/20           Functional Capacity:  6 Minute Walk    Row Name 05/13/20 0836 07/07/20 0727       6 Minute Walk   Phase Initial Discharge  Distance 1818 feet 1674 feet    Distance % Change -- 7.92 %    Distance Feet Change -- 144 ft    Walk Time 6 minutes 6 minutes    # of Rest Breaks 0 0    MPH 3.44 3.17    METS 4.59 3.97    RPE 9 11    Perceived Dyspnea  0 0    VO2 Peak 16.06 13.89    Symptoms Yes (comment) No    Comments Patient c/o feeling a "little winded" during the walk test. --    Resting HR 90 bpm 78 bpm    Resting BP 142/70 118/58    Resting Oxygen Saturation  98 % --    Exercise Oxygen Saturation  during 6 min walk 98 % --    Max Ex. HR 126 bpm 116 bpm    Max Ex. BP 168/70 140/62    2 Minute Post BP 130/78 108/70           Psychological, QOL, Others - Outcomes: PHQ 2/9: Depression screen PHQ 2/9 05/13/2020  Decreased Interest 0  Down, Depressed, Hopeless 0  PHQ - 2 Score 0    Quality of Life:  Quality of Life - 07/07/20 1635      Quality of Life   Select Quality of Life      Quality of Life Scores   Health/Function Post 15.6 %    Socioeconomic Post 18.29 %    Psych/Spiritual Post 16.29 %    Family  Pre --    Family Post 12 %    GLOBAL Post 15.76 %           Personal Goals: Goals established at orientation with interventions provided to work toward goal.  Personal Goals and Risk Factors at Admission - 05/13/20 1009      Core Components/Risk Factors/Patient Goals on Admission    Weight Management Yes;Obesity    Intervention Weight Management/Obesity: Establish reasonable short term and long term weight goals.;Obesity: Provide education and appropriate resources to help participant work on and attain dietary goals.    Admit Weight 240 lb 4.8 oz (109 kg)    Expected Outcomes Short Term: Continue to assess and modify interventions until short term weight is achieved;Long Term: Adherence to nutrition and physical activity/exercise program aimed toward attainment of established weight goal;Weight Loss: Understanding of general recommendations for a balanced deficit meal plan, which promotes 1-2 lb weight loss per week and includes a negative energy balance of [phone] kcal/d;Understanding recommendations for meals to include 15-35% energy as protein, 25-35% energy from fat, 35-60% energy from carbohydrates, less than $RemoveB'200mg'NwgImzFz$  of dietary cholesterol, 20-35 gm of total fiber daily;Understanding of distribution of calorie intake throughout the day with the consumption of 4-5 meals/snacks    Hypertension Yes    Intervention Provide education on lifestyle modifcations including regular physical activity/exercise, weight management, moderate sodium restriction and increased consumption of fresh fruit, vegetables, and low fat dairy, alcohol moderation, and smoking cessation.;Monitor prescription use compliance.    Expected Outcomes Short Term: Continued assessment and intervention until BP is < 140/42mm HG in hypertensive participants. < 130/69mm HG in hypertensive participants with diabetes, heart failure or chronic kidney disease.;Long Term: Maintenance of blood pressure at goal levels.    Lipids Yes     Intervention Provide education and support for participant on nutrition & aerobic/resistive exercise along with prescribed medications to achieve LDL '70mg'$ , HDL >$Remo'40mg'UEfce$ .    Expected Outcomes Short Term: Participant states understanding of desired  cholesterol values and is compliant with medications prescribed. Participant is following exercise prescription and nutrition guidelines.;Long Term: Cholesterol controlled with medications as prescribed, with individualized exercise RX and with personalized nutrition plan. Value goals: LDL < $Rem'70mg'RYXm$ , HDL > 40 mg.    Stress Yes    Intervention Offer individual and/or small group education and counseling on adjustment to heart disease, stress management and health-related lifestyle change. Teach and support self-help strategies.;Refer participants experiencing significant psychosocial distress to appropriate mental health specialists for further evaluation and treatment. When possible, include family members and significant others in education/counseling sessions.    Expected Outcomes Short Term: Participant demonstrates changes in health-related behavior, relaxation and other stress management skills, ability to obtain effective social support, and compliance with psychotropic medications if prescribed.;Long Term: Emotional wellbeing is indicated by absence of clinically significant psychosocial distress or social isolation.            Personal Goals Discharge:  Goals and Risk Factor Review    Row Name 05/19/20 1239 06/03/20 1519 06/23/20 1552         Core Components/Risk Factors/Patient Goals Review   Personal Goals Review Weight Management/Obesity;Lipids;Stress;Hypertension Weight Management/Obesity;Lipids;Stress;Hypertension Weight Management/Obesity;Lipids;Stress;Hypertension     Review Mr. Kuwahara has multiple CAD risk factors. He is eager to participate in CR for risk factor modification. His goals are to be able to complete his ADLs with better ease, get  back to walking and hiking, and extend the longivity of his life for his children and grandchildren. Mr. Widmer has multiple CAD risk factors. He is eager to participate in CR for risk factor modification. His goals are to be able to complete his ADLs with better ease, get back to walking and hiking, and extend the longivity of his life for his children and grandchildren. Mr. Sauceda has multiple CAD risk factors. He continues to be eager to participate in CR and home exercise for risk factor modification. His goals are to be able to complete his ADLs with better ease, get back to walking and hiking, and extend the longivity of his life for his children and grandchildren. He is on track to meet his goals prior to graduation 07/11/20     Expected Outcomes Patient will continue to participate in CR for risk factor modification. Patient will continue to participate in CR for risk factor modification. Patient will continue to participate in CR for risk factor modification.            Exercise Goals and Review:  Exercise Goals    Row Name 05/13/20 0827             Exercise Goals   Increase Physical Activity Yes       Intervention Provide advice, education, support and counseling about physical activity/exercise needs.;Develop an individualized exercise prescription for aerobic and resistive training based on initial evaluation findings, risk stratification, comorbidities and participant's personal goals.       Expected Outcomes Short Term: Attend rehab on a regular basis to increase amount of physical activity.;Long Term: Exercising regularly at least 3-5 days a week.;Long Term: Add in home exercise to make exercise part of routine and to increase amount of physical activity.       Increase Strength and Stamina Yes       Intervention Provide advice, education, support and counseling about physical activity/exercise needs.;Develop an individualized exercise prescription for aerobic and resistive  training based on initial evaluation findings, risk stratification, comorbidities and participant's personal goals.       Expected  Outcomes Short Term: Increase workloads from initial exercise prescription for resistance, speed, and METs.;Short Term: Perform resistance training exercises routinely during rehab and add in resistance training at home;Long Term: Improve cardiorespiratory fitness, muscular endurance and strength as measured by increased METs and functional capacity (6MWT)       Able to understand and use rate of perceived exertion (RPE) scale Yes       Intervention Provide education and explanation on how to use RPE scale       Expected Outcomes Short Term: Able to use RPE daily in rehab to express subjective intensity level;Long Term:  Able to use RPE to guide intensity level when exercising independently       Knowledge and understanding of Target Heart Rate Range (THRR) Yes       Intervention Provide education and explanation of THRR including how the numbers were predicted and where they are located for reference       Expected Outcomes Short Term: Able to state/look up THRR;Long Term: Able to use THRR to govern intensity when exercising independently;Short Term: Able to use daily as guideline for intensity in rehab       Able to check pulse independently Yes       Intervention Provide education and demonstration on how to check pulse in carotid and radial arteries.;Review the importance of being able to check your own pulse for safety during independent exercise       Expected Outcomes Short Term: Able to explain why pulse checking is important during independent exercise;Long Term: Able to check pulse independently and accurately       Understanding of Exercise Prescription Yes       Intervention Provide education, explanation, and written materials on patient's individual exercise prescription       Expected Outcomes Short Term: Able to explain program exercise prescription;Long  Term: Able to explain home exercise prescription to exercise independently              Exercise Goals Re-Evaluation:  Exercise Goals Re-Evaluation    Row Name 05/19/20 3785 06/04/20 0733 07/02/20 0707         Exercise Goal Re-Evaluation   Exercise Goals Review Increase Physical Activity;Able to understand and use rate of perceived exertion (RPE) scale Increase Physical Activity;Able to understand and use rate of perceived exertion (RPE) scale;Understanding of Exercise Prescription;Increase Strength and Stamina;Knowledge and understanding of Target Heart Rate Range (THRR) Increase Physical Activity;Able to understand and use rate of perceived exertion (RPE) scale;Understanding of Exercise Prescription;Increase Strength and Stamina;Knowledge and understanding of Target Heart Rate Range (THRR)     Comments Patient able to use and understand RPE scale appropriately. Reviewed home exercise guidelines with patient including endpoints, temperature precautions, target heart rate and rate of perceived exertion. Pt is walking 15-30 minutes, 3 days/week as his mode of home exercise. Pt voices understanding of instructions given. Patient scheduled to graduate next week, making good progress with exercise.     Expected Outcomes Increase workloads as tolerated to help achieve personal health and fitness goals. Patient will continue walking at least 3 days/week in addition to exercise at cardiac rehab to help build a consistent routine that he can continue upon completion of the program. Patient will continue walking at least 3 days/week in addition to exercise at cardiac rehab to help build a consistent routine that he can continue upon completion of the program.            Nutrition & Weight - Outcomes:  Pre  Biometrics - 05/13/20 0803      Pre Biometrics   Waist Circumference 43.5 inches    Hip Circumference 47.5 inches    Waist to Hip Ratio 0.92 %    Triceps Skinfold 14.5 mm    % Body Fat 30.8 %      Grip Strength 43.5 kg    Flexibility 15.25 in    Single Leg Stand 1.43 seconds           Post Biometrics - 07/07/20 0735       Post  Biometrics   Waist Circumference 43 inches    Hip Circumference 48 inches    Waist to Hip Ratio 0.9 %    Triceps Skinfold 14 mm    Grip Strength 47.5 kg    Flexibility 15.5 in    Single Leg Stand 30 seconds           Nutrition:  Nutrition Therapy & Goals - 05/23/20 1020      Nutrition Therapy   Diet Heart healthy    Drug/Food Interactions Statins/Certain Fruits      Personal Nutrition Goals   Nutrition Goal Pt to build a healthy plate including vegetables, fruits, whole grains, and low-fat dairy products in a heart healthy meal plan.    Personal Goal #2 Pt to identify food quantities necessary to achieve weight loss of 6-24 lb at graduation from cardiac rehab.      Intervention Plan   Intervention Prescribe, educate and counsel regarding individualized specific dietary modifications aiming towards targeted core components such as weight, hypertension, lipid management, diabetes, heart failure and other comorbidities.;Nutrition handout(s) given to patient.    Expected Outcomes Short Term Goal: A plan has been developed with personal nutrition goals set during dietitian appointment.           Nutrition Discharge:   Education Questionnaire Score:  Knowledge Questionnaire Score - 07/07/20 1634      Knowledge Questionnaire Score   Post Score 23/24           Goals reviewed with patient; copy given to patient. Patient states he has a recumbent bike, treadmill, pelaton bike, and hand weights in his home. He plans to utilize this equipment for exercise. He states he is more motivated to exercise daily than he has ever been. He feel he also may join a local gym for socialization.

## 2020-07-11 ENCOUNTER — Other Ambulatory Visit: Payer: Self-pay

## 2020-07-11 ENCOUNTER — Encounter (HOSPITAL_COMMUNITY)
Admission: RE | Admit: 2020-07-11 | Discharge: 2020-07-11 | Disposition: A | Discharge status: Home / Self Care | Payer: 59 | Source: Ambulatory Visit | Attending: Cardiology | Admitting: Cardiology

## 2020-07-11 VITALS — BP 128/60 | HR 66 | Ht 70.5 in | Wt 239.0 lb

## 2020-07-11 DIAGNOSIS — Z955 Presence of coronary angioplasty implant and graft: Secondary | ICD-10-CM

## 2020-07-11 DIAGNOSIS — Z9861 Coronary angioplasty status: Secondary | ICD-10-CM

## 2020-07-11 DIAGNOSIS — I2510 Atherosclerotic heart disease of native coronary artery without angina pectoris: Secondary | ICD-10-CM

## 2020-07-14 ENCOUNTER — Other Ambulatory Visit: Payer: Self-pay | Admitting: Gastroenterology

## 2020-07-16 ENCOUNTER — Telehealth: Payer: Self-pay

## 2020-07-16 NOTE — Telephone Encounter (Signed)
   [facility] Pre-operative Risk Assessment     Request for surgical clearance:  1. What type of surgery is being performed? COLONOSCOPY   2. When is this surgery scheduled? 08-18-20   3. What type of clearance is required (medical clearance vs. Pharmacy clearance to hold med vs. Both)? BOTH  4. Are there any medications that need to be held prior to surgery and how long? BRILINTA 5DAYS  5. Practice name and name of physician performing surgery? [facility]  DR Therisa Doyne    6. What is the office phone number? [phone]   7.   What is the office fax number? [phone]  8.   Anesthesia type (None, local, MAC, general) ? PROPOFOL

## 2020-07-16 NOTE — Telephone Encounter (Signed)
Dr. Swaziland This patient had DES to LCx 02/29/20. He is on ASA and brilinta. Per patient report, he recently completed a stool test in lieu of a colonoscopy which was positive for blood. While he denies dark tarry stool, he does report occasional BRBPR. GI has recommended colonoscopy to rule out cancer and have asked to hold brilinta for 5 days. I discussed with the patient the need for uninterrupted brilinta for at least 6 months, ideally 12 months. He has not had a recent CBC.   I have reached out for clarification from GI on the urgency of the colonoscopy - waiting to hear back.   Can you provide some guidance for interrupting brilinta for 5 days for colonoscopy in this setting?

## 2020-07-16 NOTE — Telephone Encounter (Signed)
I spoke directly with GI about the urgency of his colonoscopy. They are now going to do a virtual colonoscopy since he can't hold brilinta. I will remove this request from the pre-op pool.   Marcelino Duster, PA-C 07/16/2020, 4:44 PM [phone] [facility] [address] [place], [place] [address]

## 2020-07-16 NOTE — Telephone Encounter (Signed)
He should not stop DAPT now. He had stent placed for unstable angina so would really like to avoid stopping DAPT for one year. If it is really pressing could hold Brilinta for 5 days after 6 months and continue ASA but ideally would hold off. ? Doing Cologuard test in interim.   Stephen Scott

## 2020-07-17 ENCOUNTER — Other Ambulatory Visit: Payer: Self-pay | Admitting: Gastroenterology

## 2020-07-17 DIAGNOSIS — R195 Other fecal abnormalities: Secondary | ICD-10-CM

## 2020-07-24 MED FILL — BRILINTA 90 MG TABLET: 90 | 30 days supply | Qty: 60 | Fill #3

## 2020-07-29 NOTE — Addendum Note (Signed)
Encounter addended by: Oscar La, RN on: 07/29/2020 4:56 PM  Actions taken: Episode resolved, Flowsheet data copied forward, Flowsheet accepted, Clinical Note Signed

## 2020-07-29 NOTE — Addendum Note (Signed)
Encounter addended by: Oscar La, RN on: 07/29/2020 5:00 PM  Actions taken: Clinical Note Signed

## 2020-08-08 ENCOUNTER — Ambulatory Visit
Admission: RE | Admit: 2020-08-08 | Discharge: 2020-08-08 | Disposition: A | Discharge status: Home / Self Care | Payer: 59 | Source: Ambulatory Visit | Attending: Gastroenterology | Admitting: Gastroenterology

## 2020-08-08 DIAGNOSIS — R195 Other fecal abnormalities: Secondary | ICD-10-CM

## 2020-08-14 ENCOUNTER — Other Ambulatory Visit (HOSPITAL_COMMUNITY): Payer: 59

## 2020-08-18 ENCOUNTER — Ambulatory Visit (HOSPITAL_COMMUNITY): Admit: 2020-08-18 | Payer: 59 | Admitting: Gastroenterology

## 2020-08-18 ENCOUNTER — Encounter (HOSPITAL_COMMUNITY): Payer: Self-pay

## 2020-08-18 SURGERY — COLONOSCOPY WITH PROPOFOL
Anesthesia: Monitor Anesthesia Care

## 2020-09-01 ENCOUNTER — Ambulatory Visit: Payer: 59 | Admitting: Interventional Cardiology

## 2020-09-05 MED FILL — BRILINTA 90 MG TABLET: 90 | 30 days supply | Qty: 60 | Fill #4

## 2020-10-20 MED FILL — BRILINTA 90 MG TABLET: 90 | 30 days supply | Qty: 60 | Fill #5

## 2020-11-11 ENCOUNTER — Telehealth: Payer: Self-pay | Admitting: Cardiology

## 2020-11-11 NOTE — Telephone Encounter (Signed)
Received direct call into triage from patient. Patient states that today while at work there was an incident that involved yelling and patient states he got very upset and worked up. Patient states that right after this he started experiencing a "spark" feeling in his chest. Patient states he would not call it a pain and did not take nitro. Patient reports this feeling being in the center of his chest. Patient states this went away after a few moments. Patient states he went home around 1pm today due to feeling shaky, flushed feeling in his face, and burning in the back of his throat. Patient denies any shortness of breath or vision changes. Patient states that he has been under a lot of stress recently and has been eating a lot of salty foods. Patient states his blood pressures over the last few days have been 150-160 systolic, and today after returning home his blood pressure was 180. While speaking with patient he checked his blood pressure and is currently 170/99 with HR of 67 and o2 of 99%. Patient's wife states shes feels that the patient slurred his speech a little when telling her about the incident at work upon returning home but has not done it since then. No noticeable slurring while speaking with patient- his voice is very calm and clear. Patient denies any facial drooping, numbness, tingling, or weakness. Patient states he is starting to feel a little better. Made patient an appointment to see Dr. Swaziland on 2/18 at 2:20pm.   Advised patient to try to rest and relax and to continue monitoring blood pressure and symptoms. Advised patient that if he were to develop chest pain, facial drooping, slurring of words, weakness on one side of body or ANY worsening symptoms he should report to the ED. Patient verbalized understanding.

## 2020-11-11 NOTE — Telephone Encounter (Signed)
Pt c/o of Chest Pain: STAT if CP now or developed within 24 hours  1. Are you having CP right now? no  2. Are you experiencing any other symptoms (ex. SOB, nausea, vomiting, sweating)? Burning tired feeling in throat, face was extremely red   3. How long have you been experiencing CP? Started at work, someone yelled at him started after about 9:30 or 10   4. Is your CP continuous or coming and going? Came and went  5. Have you taken Nitroglycerin? No   Patient states he had an episode of a spark of pain in his chest today at work after being yelled at. He states he is also having high BP. He states now it is 180/97 HR 68 and his oxygen is 98%. He states his head hurt a little, but is not having any blurred vision. He states the pain in his chest was not a full blown pain, but he does feel shaky. ?

## 2020-11-12 NOTE — Telephone Encounter (Signed)
Agree. Will see on the 18th  Stephen Scott

## 2020-11-13 NOTE — Progress Notes (Signed)
Cardiology Office Note   Date:  11/14/2020   ID:  Stephen Scott, DOB [birth year], MRN [id number]  PCP:  Stephen Beams, MD  Cardiologist:   Stephen Rice [place], MD   Chief Complaint  Patient presents with   Hypertension   Coronary Artery Disease      History of Present Illness: Stephen Scott is a 60 y.o. male who is seen for evaluation of elevated BP and chest pain. He has a history of HTN and HLD. He presented in June 2021 with unstable angina. He underwent cardiac cath showing severe LCx disease successfully treated with DES. Initially he did very well with this. Lost about 40 lbs with dietary changes. Was doing Cardiac Rehab. Felt very well.   Since December he reports he has been under a lot of stress at work. Notes his BP has gone up. Has not been eating as well and has gained back about 20 lbs. Notes BP has been running high. Eating more salty food. Last week had a very stressful day and BP went up to 100/100. Felt bad. Noted HA and left temporal pain. Was flushed. Noted some burning in the throat and a little instant pains. No jaw pain which was his presenting symptom. Has not been to Rehab for a month.   Past Medical History:  Diagnosis Date   Anemia    as a small child   Anxiety    Arthritis    "lower spine; knees" (11/01/2017)   Chronic lower back pain    Depression    Family history of adverse reaction to anesthesia    "daughter PONV"   GERD (gastroesophageal reflux disease)    High cholesterol    History of kidney stones    Hypertension    Pneumonia 06/2017; 08/2017   walking pneumonia; treated   Spinal stenosis    "lower back" (11/01/2017)    Past Surgical History:  Procedure Laterality Date   CORONARY STENT INTERVENTION N/A 03/04/2020   Procedure: CORONARY STENT INTERVENTION;  Surgeon: Scott, Stephen Degroote M, MD;  Location: [facility] INVASIVE CV LAB;  Service: Cardiovascular;  Laterality: N/A;   HAND RECONSTRUCTION Left 1989   "injured when washing dishes"    INTRAVASCULAR PRESSURE WIRE/FFR STUDY N/A 03/04/2020   Procedure: INTRAVASCULAR PRESSURE WIRE/FFR STUDY;  Surgeon: Scott, Stephen Belay M, MD;  Location: [facility] INVASIVE CV LAB;  Service: Cardiovascular;  Laterality: N/A;   INTRAVASCULAR ULTRASOUND/IVUS N/A 03/04/2020   Procedure: Intravascular Ultrasound/IVUS;  Surgeon: Scott, Stephen Ratti M, MD;  Location: [facility] INVASIVE CV LAB;  Service: Cardiovascular;  Laterality: N/A;   JOINT REPLACEMENT     KNEE ARTHROSCOPY Right 2008   LEFT HEART CATH AND CORONARY ANGIOGRAPHY N/A 03/04/2020   Procedure: LEFT HEART CATH AND CORONARY ANGIOGRAPHY;  Surgeon: Scott, Stephen Cobbins M, MD;  Location: [facility] INVASIVE CV LAB;  Service: Cardiovascular;  Laterality: N/A;   TOTAL KNEE ARTHROPLASTY Right 11/01/2017   TOTAL KNEE ARTHROPLASTY Right 11/01/2017   Procedure: RIGHT TOTAL KNEE ARTHROPLASTY;  Surgeon: Stephen Hitch, MD;  Location: [facility] OR;  Service: Orthopedics;  Laterality: Right;   WISDOM TOOTH EXTRACTION  1981     Current Outpatient Medications  Medication Sig Dispense Refill   amLODipine (NORVASC) 10 MG tablet Take 10 mg by mouth daily.     aspirin EC 81 MG tablet Take 1 tablet (81 mg total) by mouth daily. 90 tablet 3   atorvastatin (LIPITOR) 80 MG tablet Take 1 tablet (80 mg total) by mouth daily. 90 tablet 3   buPROPion (WELLBUTRIN XL) 300  MG 24 hr tablet Take 300 mg by mouth daily.      chlorthalidone (HYGROTON) 25 MG tablet Take 0.5 tablets (12.5 mg total) by mouth daily. 45 tablet 3   famotidine (PEPCID) 20 MG tablet Take 20 mg by mouth at bedtime as needed for heartburn.      gabapentin (NEURONTIN) 300 MG capsule Take 300 mg by mouth at bedtime.     loratadine (CLARITIN) 10 MG tablet Take 10 mg by mouth daily as needed for allergies.      losartan (COZAAR) 100 MG tablet Take 100 mg by mouth daily.     Polyethyl Glycol-Propyl Glycol (SYSTANE OP) Place 1 drop into both eyes daily as needed (dry eyes).     sertraline (ZOLOFT) 100 MG tablet Take 100 mg by  mouth daily.     ticagrelor (BRILINTA) 90 MG TABS tablet Take 1 tablet (90 mg total) by mouth 2 (two) times daily. 60 tablet 11   nitroGLYCERIN (NITROSTAT) 0.4 MG SL tablet Place 1 tablet (0.4 mg total) under the tongue every 5 (five) minutes as needed for chest pain. 90 tablet 3   No current facility-administered medications for this visit.    Allergies:   Iodides and Tetanus toxoids    Social History:  The patient  reports that he has never smoked. He has never used smokeless tobacco. He reports current alcohol use of about 3.0 standard drinks of alcohol per week. He reports that he does not use drugs.   Family History:  The patient's family history includes Clotting disorder in his mother; Diabetes in his mother; Heart attack in his brother; Heart disease in his brother; Hyperlipidemia in his mother; Hypertension in his mother; Pulmonary embolism in his mother.    ROS:  Please see the history of present illness.   Otherwise, review of systems are positive for none.   All other systems are reviewed and negative.    PHYSICAL EXAM: VS:  BP (!) 142/77    Pulse 63    Ht 5\' 11"  (1.803 Scott)    Wt 252 lb 9.6 oz (114.6 kg)    SpO2 98%    BMI 35.23 kg/Scott  , BMI Body mass index is 35.23 kg/Scott. GEN: Well nourished, overweight, in no acute distress  HEENT: normal  Neck: no JVD, carotid bruits, or masses Cardiac: RRR; no murmurs, rubs, or gallops,no edema  Respiratory:  clear to auscultation bilaterally, normal work of breathing GI: soft, nontender, nondistended, + BS MS: no deformity or atrophy  Skin: warm and dry, no rash Neuro:  Strength and sensation are intact Psych: euthymic mood, full affect   EKG:  EKG is ordered today. NSR with rate 63, normal. I have personally reviewed and interpreted this study.     Recent Labs: 03/04/2020: BUN 16; Creatinine, Ser 0.86; Hemoglobin 15.8; Platelets 277; Potassium 4.5; Sodium 137    Lipid Panel No results found for: CHOL, TRIG, HDL, CHOLHDL,  VLDL, LDLCALC, LDLDIRECT   Wt Readings from Last 3 Encounters:  11/14/20 252 lb 9.6 oz (114.6 kg)  07/11/20 238 lb 15.7 oz (108.4 kg)  06/26/20 234 lb (106.1 kg)      Other studies Reviewed: Additional studies/ records that were reviewed today include:   Labs dated 02/21/20: cholesterol 204, triglycerides 133, HDL 44, LDL 136. CBC, chemistries and TSH  Normal. Dated 03/04/20: normal CBC.  Labs dated 05/05/20: A1c 5.2%. cholesterol 119, triglycerides 138, HDL 34, LDL 61. Normal CMET  Cardiac cath 03/04/20:  CORONARY STENT INTERVENTION  INTRAVASCULAR PRESSURE WIRE/FFR STUDY  Intravascular Ultrasound/IVUS  LEFT HEART CATH AND CORONARY ANGIOGRAPHY  Conclusion    Prox Cx to Mid Cx lesion is 80% stenosed.  Post intervention, there is a 0% residual stenosis.  A drug-eluting stent was successfully placed using a STENT RESOLUTE ONYX 3.5X22.  The left ventricular systolic function is normal.  LV end diastolic pressure is normal.  The left ventricular ejection fraction is 55-65% by visual estimate.   1. Single vessel obstructive CAD involving the LCx. 2. Normal LV function 3. Normal LVEDP 4. Successful PCI of the LCx using FFR and IVUS guidance with DES x 1.  Plan: anticipate same day DC. DAPT with ASA and Brilinta x 1 year.     ASSESSMENT AND PLAN:  1.  CAD s/p DES of LCx in June 2021. He is having some atypical symptoms more related to stress and elevated BP. Stressed importance of healthy eating, stress management, and exercise. Will focus on BP control. Check labs. Reevaluate in 3 weeks. If he remains symptomatic despite good BP control may need further ischemic evaluation. 2 . HTN. Reinforce lifestyle modification with diet, weight loss, low sodium. Will add chlorthalidone 12.5 mg daily.  3. Hypercholesterolemia. LDL 136>>61 on lipitor 80 mg daily and dietary modification. Will repeat lab work today. 4. Family history of CAD 5. Obesity encourage continued lifestyle  modification.     Current medicines are reviewed at length with the patient today.  The patient does not have concerns regarding medicines.  The following changes have been made:  See above  Labs/ tests ordered today include: Ecg, CBC, CMET, Lipids.   No orders of the defined types were placed in this encounter.    Disposition:   FU 3-4 weeks.  Signed, Vishaal Strollo [place], MD  11/14/2020 2:47 PM    [facility] [address], [place], [place], [address] Phone [phone], Fax [phone]

## 2020-11-14 ENCOUNTER — Encounter: Payer: Self-pay | Admitting: Cardiology

## 2020-11-14 ENCOUNTER — Other Ambulatory Visit: Payer: Self-pay

## 2020-11-14 ENCOUNTER — Ambulatory Visit (INDEPENDENT_AMBULATORY_CARE_PROVIDER_SITE_OTHER): Payer: 59 | Admitting: Cardiology

## 2020-11-14 VITALS — BP 142/77 | HR 63 | Ht 71.0 in | Wt 252.6 lb

## 2020-11-14 DIAGNOSIS — Z9861 Coronary angioplasty status: Secondary | ICD-10-CM

## 2020-11-14 DIAGNOSIS — I2510 Atherosclerotic heart disease of native coronary artery without angina pectoris: Secondary | ICD-10-CM | POA: Diagnosis not present

## 2020-11-14 DIAGNOSIS — I10 Essential (primary) hypertension: Secondary | ICD-10-CM | POA: Diagnosis not present

## 2020-11-14 DIAGNOSIS — E785 Hyperlipidemia, unspecified: Secondary | ICD-10-CM

## 2020-11-14 MED ORDER — NITROGLYCERIN 0.4 MG SL SUBL: 0.4000 mg | SUBLINGUAL_TABLET | SUBLINGUAL | 3 refills | Status: DC | PRN

## 2020-11-14 MED ORDER — CHLORTHALIDONE 25 MG PO TABS: 12.5000 mg | ORAL_TABLET | Freq: Every day | ORAL | 3 refills | Status: DC

## 2020-11-14 NOTE — Addendum Note (Signed)
Addended by: Neoma Laming on: 11/14/2020 02:54 PM   Modules accepted: Orders

## 2020-11-14 NOTE — Addendum Note (Signed)
Addended by: Neoma Laming on: 11/14/2020 03:07 PM   Modules accepted: Orders

## 2020-11-14 NOTE — Patient Instructions (Addendum)
Start chlorthalidone 12.5 mg daily  You need to rededicate yourself to healthy eating/low sodium  We will check lab work today  Get back into exercise.

## 2020-11-15 LAB — BASIC METABOLIC PANEL
BUN/Creatinine Ratio: 21 (ref 10–24)
BUN: 19 mg/dL (ref 8–27)
CO2: 18 mmol/L — ABNORMAL LOW (ref 20–29)
Calcium: 8.9 mg/dL (ref 8.6–10.2)
Chloride: 106 mmol/L (ref 96–106)
Creatinine, Ser: 0.9 mg/dL (ref 0.76–1.27)
GFR calc Af Amer: 106 mL/min/{1.73_m2} (ref >59)
GFR calc non Af Amer: 92 mL/min/{1.73_m2} (ref >59)
Glucose: 86 mg/dL (ref 65–99)
Potassium: 4.1 mmol/L (ref 3.5–5.2)
Sodium: 140 mmol/L (ref 134–144)

## 2020-11-15 LAB — CBC WITH DIFFERENTIAL/PLATELET
Basophils Absolute: 0 10*3/uL (ref 0.0–0.2)
Basos: 0 %
EOS (ABSOLUTE): 0.3 10*3/uL (ref 0.0–0.4)
Eos: 3 %
Hematocrit: 41.9 % (ref 37.5–51.0)
Hemoglobin: 14.2 g/dL (ref 13.0–17.7)
Immature Grans (Abs): 0 10*3/uL (ref 0.0–0.1)
Immature Granulocytes: 0 %
Lymphocytes Absolute: 1.6 10*3/uL (ref 0.7–3.1)
Lymphs: 19 %
MCH: 29.6 pg (ref 26.6–33.0)
MCHC: 33.9 g/dL (ref 31.5–35.7)
MCV: 87 fL (ref 79–97)
Monocytes Absolute: 1 10*3/uL — ABNORMAL HIGH (ref 0.1–0.9)
Monocytes: 12 %
Neutrophils Absolute: 5.4 10*3/uL (ref 1.4–7.0)
Neutrophils: 66 %
Platelets: 216 10*3/uL (ref 150–450)
RBC: 4.8 x10E6/uL (ref 4.14–5.80)
RDW: 13 % (ref 11.6–15.4)
WBC: 8.3 10*3/uL (ref 3.4–10.8)

## 2020-11-15 LAB — LIPID PANEL
Chol/HDL Ratio: 3.3 ratio (ref 0.0–5.0)
Cholesterol, Total: 174 mg/dL (ref 100–199)
HDL: 52 mg/dL (ref >39)
LDL Chol Calc (NIH): 102 mg/dL — ABNORMAL HIGH (ref 0–99)
Triglycerides: 113 mg/dL (ref 0–149)
VLDL Cholesterol Cal: 20 mg/dL (ref 5–40)

## 2020-11-15 LAB — HEPATIC FUNCTION PANEL
ALT: 33 IU/L (ref 0–44)
AST: 24 IU/L (ref 0–40)
Albumin: 4.7 g/dL (ref 3.8–4.8)
Alkaline Phosphatase: 70 IU/L (ref 44–121)
Bilirubin Total: 0.5 mg/dL (ref 0.0–1.2)
Bilirubin, Direct: 0.12 mg/dL (ref 0.00–0.40)
Total Protein: 7.2 g/dL (ref 6.0–8.5)

## 2020-11-19 MED FILL — BRILINTA 90 MG TABLET: 90 | 30 days supply | Qty: 60 | Fill #6

## 2020-11-23 ENCOUNTER — Other Ambulatory Visit: Payer: Self-pay | Admitting: Cardiology

## 2020-11-28 NOTE — Progress Notes (Signed)
Cardiology Office Note   Date:  12/01/2020   ID:  Stephen Scott, DOB May 16, 1961, MRN [id number]  PCP:  Aliene Beams, MD  Cardiologist:   Bobak Oguinn [place], MD   Chief Complaint  Patient presents with  . Chest Pain     History of Present Illness: Stephen Scott is a 60 y.o. male who is seen for evaluation of elevated BP and chest pain. He has a history of HTN and HLD. He presented in June 2021 with unstable angina. He underwent cardiac cath showing severe LCx disease successfully treated with DES. Initially he did very well with this. Lost about 40 lbs with dietary changes. Was doing Cardiac Rehab. Felt very well.   Since December he reported he has been under a lot of stress at work. Notes his BP has gone up. Has not been eating as well and has gained back about 20 lbs. Notes BP has been running high. Eating more salty food. Last week had a very stressful day and BP went up to 170/100. Felt bad. Noted HA and left temporal pain. Was flushed. Noted some burning in the throat and  little instant pains. No jaw pain which was his presenting symptom. Has not been to Rehab for a month.  We started him on chlorthalidone 12.5 mg daily. Redirected him on lifestyle modification. He noted BP improved but really got a lot better when he increased chlorthalidone to 25 mg daily. Now his BP is 117-120/60-70. He is trying to eat better but continues to struggle with this. He is exercising again with walking and stationary bike. He continues to have a lot of stress at work and in his personal life. Is seeing a Veterinary surgeon. His symptoms noted above have improved. Has not used Ntg. Did note some jaw pain on one occasion.   Past Medical History:  Diagnosis Date  . Anemia    as a small child  . Anxiety   . Arthritis    "lower spine; knees" (11/01/2017)  . Chronic lower back pain   . Depression   . Family history of adverse reaction to anesthesia    "daughter PONV"  . GERD (gastroesophageal reflux disease)   .  High cholesterol   . History of kidney stones   . Hypertension   . Pneumonia 06/2017; 08/2017   walking pneumonia; treated  . Spinal stenosis    "lower back" (11/01/2017)    Past Surgical History:  Procedure Laterality Date  . CORONARY STENT INTERVENTION N/A 03/04/2020   Procedure: CORONARY STENT INTERVENTION;  Surgeon: [place], Kyle Luppino M, MD;  Location: [facility] INVASIVE CV LAB;  Service: Cardiovascular;  Laterality: N/A;  . HAND RECONSTRUCTION Left 1989   "injured when washing dishes"  . INTRAVASCULAR PRESSURE WIRE/FFR STUDY N/A 03/04/2020   Procedure: INTRAVASCULAR PRESSURE WIRE/FFR STUDY;  Surgeon: [place], Easter Schinke M, MD;  Location: [facility] INVASIVE CV LAB;  Service: Cardiovascular;  Laterality: N/A;  . INTRAVASCULAR ULTRASOUND/IVUS N/A 03/04/2020   Procedure: Intravascular Ultrasound/IVUS;  Surgeon: [place], Carmela Piechowski M, MD;  Location: [facility] INVASIVE CV LAB;  Service: Cardiovascular;  Laterality: N/A;  . JOINT REPLACEMENT    . KNEE ARTHROSCOPY Right 2008  . LEFT HEART CATH AND CORONARY ANGIOGRAPHY N/A 03/04/2020   Procedure: LEFT HEART CATH AND CORONARY ANGIOGRAPHY;  Surgeon: [place], Jenniffer Vessels M, MD;  Location: [facility] INVASIVE CV LAB;  Service: Cardiovascular;  Laterality: N/A;  . TOTAL KNEE ARTHROPLASTY Right 11/01/2017  . TOTAL KNEE ARTHROPLASTY Right 11/01/2017   Procedure: RIGHT TOTAL KNEE ARTHROPLASTY;  Surgeon: Doneen Poisson  Y, MD;  Location: [facility] OR;  Service: Orthopedics;  Laterality: Right;  . WISDOM TOOTH EXTRACTION  1981     Current Outpatient Medications  Medication Sig Dispense Refill  . amLODipine (NORVASC) 10 MG tablet Take 10 mg by mouth daily.    Marland Kitchen aspirin EC 81 MG tablet Take 1 tablet (81 mg total) by mouth daily. 90 tablet 3  . atorvastatin (LIPITOR) 80 MG tablet TAKE 1 TABLET(80 MG) BY MOUTH DAILY 90 tablet 3  . buPROPion (WELLBUTRIN XL) 300 MG 24 hr tablet Take 300 mg by mouth daily.     . famotidine (PEPCID) 20 MG tablet Take 20 mg by mouth at bedtime as needed for heartburn.     .  gabapentin (NEURONTIN) 300 MG capsule Take 300 mg by mouth at bedtime.    Marland Kitchen loratadine (CLARITIN) 10 MG tablet Take 10 mg by mouth daily as needed for allergies.     Marland Kitchen losartan (COZAAR) 100 MG tablet Take 100 mg by mouth daily.    . nitroGLYCERIN (NITROSTAT) 0.4 MG SL tablet Place 1 tablet (0.4 mg total) under the tongue every 5 (five) minutes as needed for chest pain. 90 tablet 3  . Polyethyl Glycol-Propyl Glycol (SYSTANE OP) Place 1 drop into both eyes daily as needed (dry eyes).    . sertraline (ZOLOFT) 100 MG tablet Take 100 mg by mouth daily.    . ticagrelor (BRILINTA) 90 MG TABS tablet Take 1 tablet (90 mg total) by mouth 2 (two) times daily. 60 tablet 11  . chlorthalidone (HYGROTON) 25 MG tablet Take 1 tablet (25 mg total) by mouth daily. 90 tablet 3   No current facility-administered medications for this visit.    Allergies:   Iodides and Tetanus toxoids    Social History:  The patient  reports that he has never smoked. He has never used smokeless tobacco. He reports current alcohol use of about 3.0 standard drinks of alcohol per week. He reports that he does not use drugs.   Family History:  The patient's family history includes Clotting disorder in his mother; Diabetes in his mother; Heart attack in his brother; Heart disease in his brother; Hyperlipidemia in his mother; Hypertension in his mother; Pulmonary embolism in his mother.    ROS:  Please see the history of present illness.   Otherwise, review of systems are positive for none.   All other systems are reviewed and negative.    PHYSICAL EXAM: VS:  BP 132/80   Pulse 64   Wt 253 lb (114.8 kg)   SpO2 96%   BMI 35.29 kg/m  , BMI Body mass index is 35.29 kg/m. GEN: Well nourished, overweight, in no acute distress  HEENT: normal  Neck: no JVD, carotid bruits, or masses Cardiac: RRR; no murmurs, rubs, or gallops,no edema  Respiratory:  clear to auscultation bilaterally, normal work of breathing GI: soft, nontender,  nondistended, + BS MS: no deformity or atrophy  Skin: warm and dry, no rash Neuro:  Strength and sensation are intact Psych: euthymic mood, full affect   EKG:  EKG is not ordered today.     Recent Labs: 11/14/2020: ALT 33; BUN 19; Creatinine, Ser 0.90; Hemoglobin 14.2; Platelets 216; Potassium 4.1; Sodium 140    Lipid Panel    Component Value Date/Time   CHOL 174 11/14/2020 1513   TRIG 113 11/14/2020 1513   HDL 52 11/14/2020 1513   CHOLHDL 3.3 11/14/2020 1513   LDLCALC 102 (H) 11/14/2020 1513     Wt  Readings from Last 3 Encounters:  12/01/20 253 lb (114.8 kg)  11/14/20 252 lb 9.6 oz (114.6 kg)  07/11/20 238 lb 15.7 oz (108.4 kg)      Other studies Reviewed: Additional studies/ records that were reviewed today include:   Labs dated 02/21/20: cholesterol 204, triglycerides 133, HDL 44, LDL 136. CBC, chemistries and TSH  Normal. Dated 03/04/20: normal CBC.  Labs dated 05/05/20: A1c 5.2%. cholesterol 119, triglycerides 138, HDL 34, LDL 61. Normal CMET  Cardiac cath 03/04/20:  CORONARY STENT INTERVENTION  INTRAVASCULAR PRESSURE WIRE/FFR STUDY  Intravascular Ultrasound/IVUS  LEFT HEART CATH AND CORONARY ANGIOGRAPHY  Conclusion    Prox Cx to Mid Cx lesion is 80% stenosed.  Post intervention, there is a 0% residual stenosis.  A drug-eluting stent was successfully placed using a STENT RESOLUTE ONYX 3.5X22.  The left ventricular systolic function is normal.  LV end diastolic pressure is normal.  The left ventricular ejection fraction is 55-65% by visual estimate.   1. Single vessel obstructive CAD involving the LCx. 2. Normal LV function 3. Normal LVEDP 4. Successful PCI of the LCx using FFR and IVUS guidance with DES x 1.  Plan: anticipate same day DC. DAPT with ASA and Brilinta x 1 year.     ASSESSMENT AND PLAN:  1.  CAD s/p DES of LCx in June 2021. He is having some atypical symptoms more related to stress and elevated BP. Stressed importance of healthy  eating, stress management, and exercise. Symptoms have improved with better BP control. For now will monitor symptoms and really focus on lifestyle modification. 2 . HTN. Reinforce lifestyle modification with diet, weight loss, low sodium. Will continue chlorthalidone 25 mg daily. Check BMET today 3. Hypercholesterolemia. LDL 136>>61>>102. on lipitor 80 mg daily . I suspect increase LDL related to poor diet and weight gain. Will reinforce healthy eating and weight loss. Repeat lipids in 3 months. 4. Family history of CAD 5. Obesity encourage continued lifestyle modification.  6. Situational stress. Patient is seeking counseling.     Current medicines are reviewed at length with the patient today.  The patient does not have concerns regarding medicines.  The following changes have been made:  See above  Labs/ tests ordered today include:BMET  No orders of the defined types were placed in this encounter.    Disposition:   FU 3 months with fasting lab.  Signed, Verdell Kincannon [place], MD  12/01/2020 10:45 AM    [facility] [address], [place], [place], [address] Phone [phone], Fax [phone]

## 2020-12-01 ENCOUNTER — Encounter: Payer: Self-pay | Admitting: Cardiology

## 2020-12-01 ENCOUNTER — Other Ambulatory Visit: Payer: Self-pay

## 2020-12-01 ENCOUNTER — Ambulatory Visit (INDEPENDENT_AMBULATORY_CARE_PROVIDER_SITE_OTHER): Payer: 59 | Admitting: Cardiology

## 2020-12-01 VITALS — BP 132/80 | HR 64 | Wt 253.0 lb

## 2020-12-01 DIAGNOSIS — I2510 Atherosclerotic heart disease of native coronary artery without angina pectoris: Secondary | ICD-10-CM | POA: Diagnosis not present

## 2020-12-01 DIAGNOSIS — E785 Hyperlipidemia, unspecified: Secondary | ICD-10-CM | POA: Diagnosis not present

## 2020-12-01 DIAGNOSIS — Z9861 Coronary angioplasty status: Secondary | ICD-10-CM | POA: Diagnosis not present

## 2020-12-01 DIAGNOSIS — I10 Essential (primary) hypertension: Secondary | ICD-10-CM | POA: Diagnosis not present

## 2020-12-01 LAB — BASIC METABOLIC PANEL
BUN/Creatinine Ratio: 20 (ref 10–24)
BUN: 15 mg/dL (ref 8–27)
CO2: 20 mmol/L (ref 20–29)
Calcium: 9.3 mg/dL (ref 8.6–10.2)
Chloride: 101 mmol/L (ref 96–106)
Creatinine, Ser: 0.74 mg/dL — ABNORMAL LOW (ref 0.76–1.27)
Glucose: 116 mg/dL — ABNORMAL HIGH (ref 65–99)
Potassium: 3.7 mmol/L (ref 3.5–5.2)
Sodium: 139 mmol/L (ref 134–144)
eGFR: 103 mL/min/{1.73_m2} (ref >59)

## 2020-12-01 MED ORDER — CHLORTHALIDONE 25 MG PO TABS: 25.0000 mg | ORAL_TABLET | Freq: Every day | ORAL | 3 refills | Status: DC

## 2020-12-01 NOTE — Patient Instructions (Signed)
Increase chlorthalidone to 25 mg daily  We will check your potassium today

## 2020-12-10 ENCOUNTER — Ambulatory Visit: Payer: 59 | Admitting: Cardiology

## 2021-01-22 ENCOUNTER — Other Ambulatory Visit (HOSPITAL_COMMUNITY): Payer: Self-pay

## 2021-01-22 MED FILL — Ticagrelor Tab 90 MG: ORAL | 30 days supply | Qty: 60 | Fill #0 | Status: AC

## 2021-02-08 IMAGING — MR MRI CERVICAL SPINE WITHOUT CONTRAST
5 series · 35 of 48 positions shown · non-contrast
Comparison: None.

CLINICAL DATA: Cervical radiculopathy at C5. Chronic neck pain.
Bilateral arm numbness and tingling.

EXAM:
MRI CERVICAL SPINE WITHOUT CONTRAST
TECHNIQUE: Multiplanar, multisequence MR imaging of the cervical spine was
performed. No intravenous contrast was administered.

[Series 2: T2 · sagittal · 3.0mm · 0.41mm/px · 7 of 17 slices shown (1 of 2)]
[im 1/17]
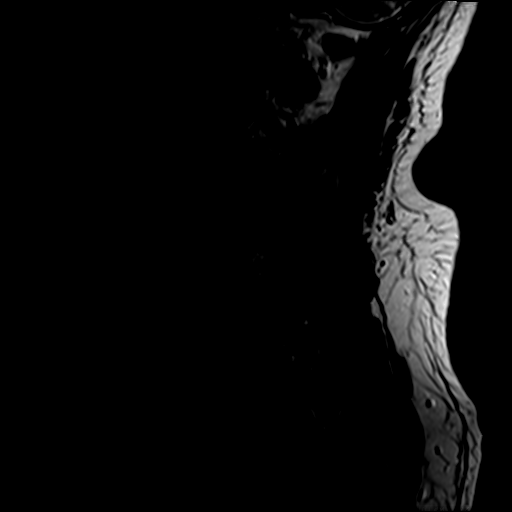
[im 3/17]
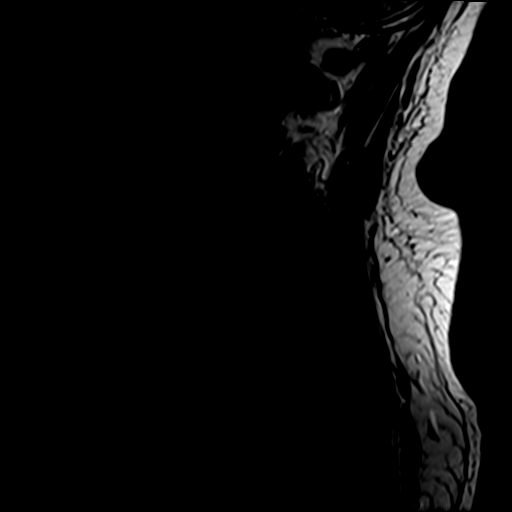
[im 6/17]
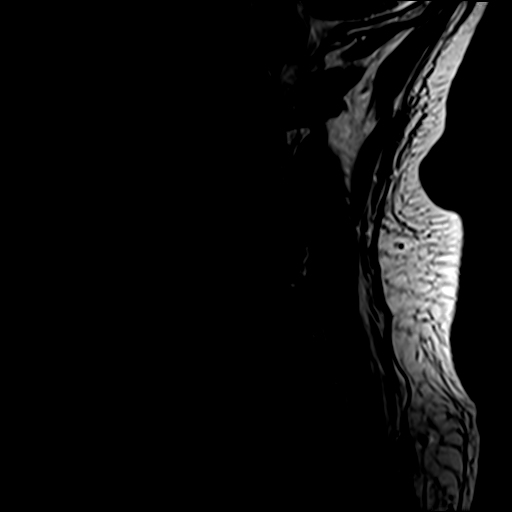
[im 9/17]
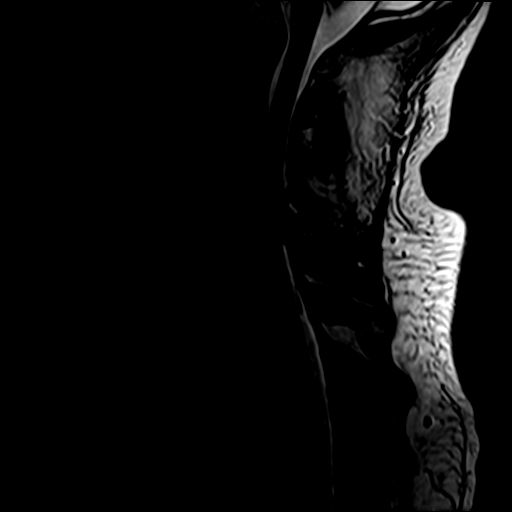
[im 11/17]
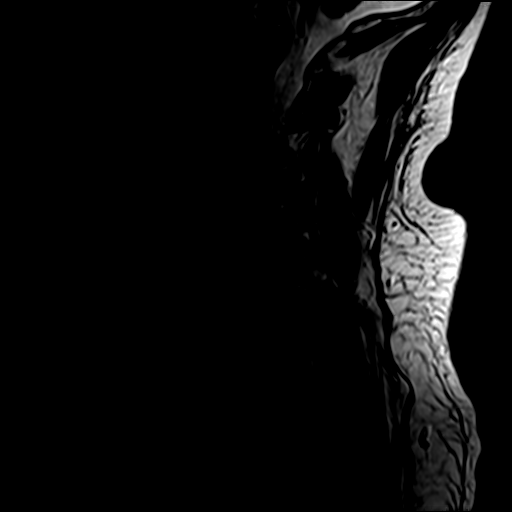
[im 14/17]
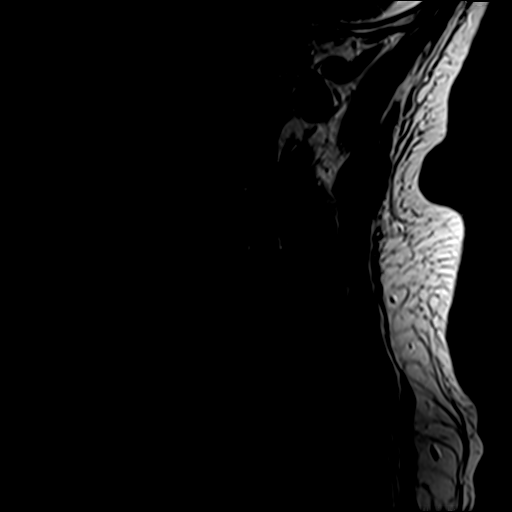
[im 17/17]
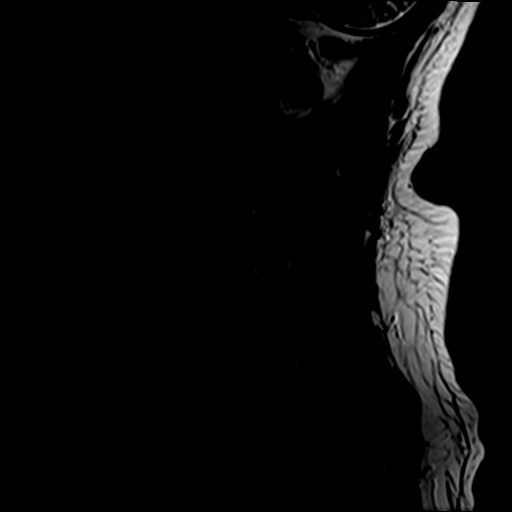

[Series 3: STIR · sagittal · 3.0mm · 0.82mm/px · 7 of 17 slices shown]
[im 1/17]
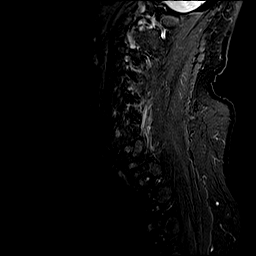
[im 3/17]
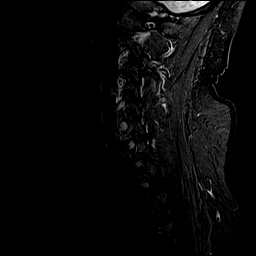
[im 6/17]
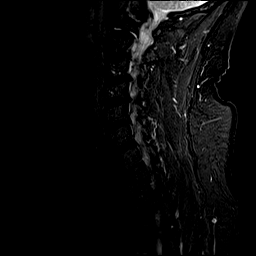
[im 9/17]
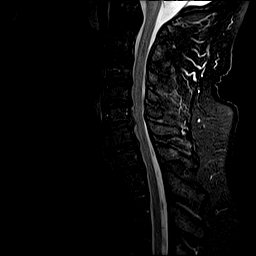
[im 11/17]
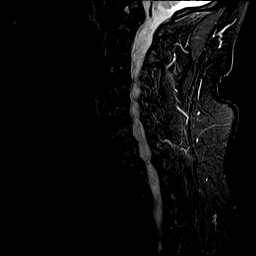
[im 14/17]
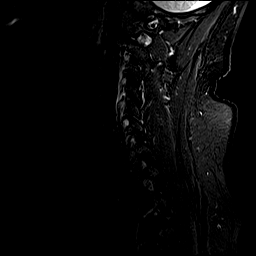
[im 17/17]
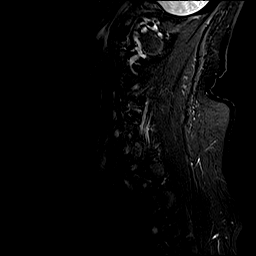

[Series 4: T1 · sagittal · 3.0mm · 0.82mm/px · 8 of 17 slices shown]
[im 1/17]
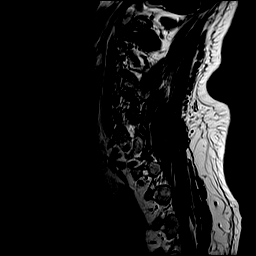
[im 3/17]
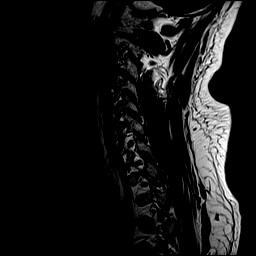
[im 5/17]
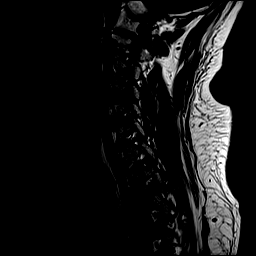
[im 7/17]
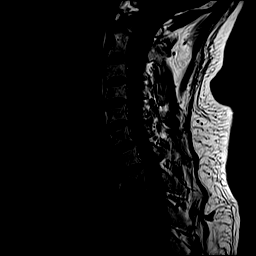
[im 10/17]
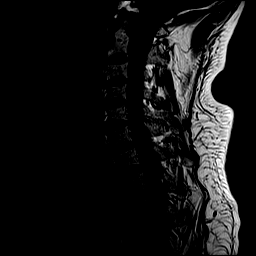
[im 12/17]
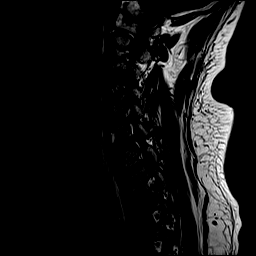
[im 14/17]
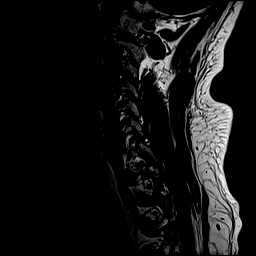
[im 17/17]
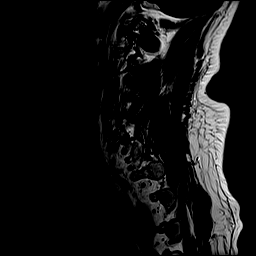

[Series 5: T2 · axial · 3.0mm · 0.70mm/px · z∈[-72,+27]mm · 9 of 28 slices shown (2 of 2)]
[im 1/28]
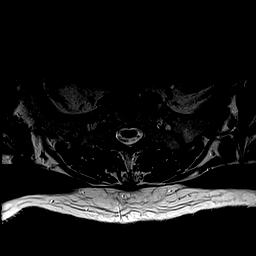
[im 5/28]
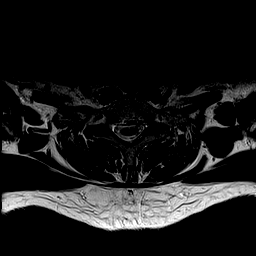
[im 10/28]
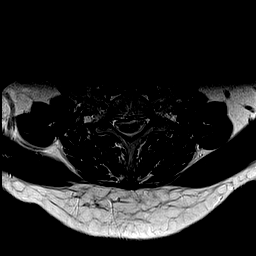
[im 12/28]
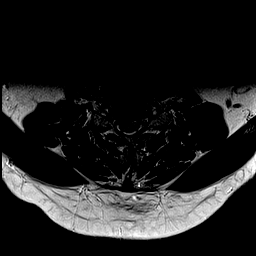
[im 14/28]
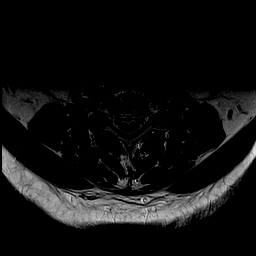
[im 16/28]
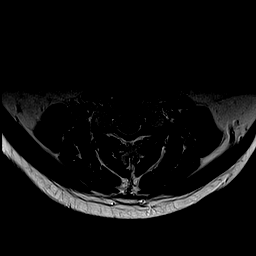
[im 19/28]
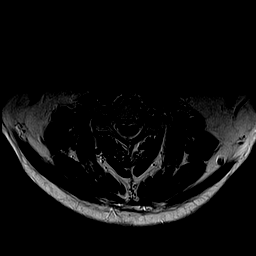
[im 23/28]
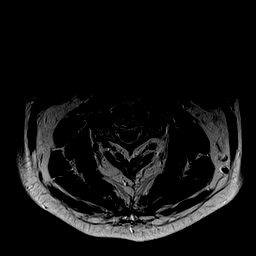
[im 28/28]
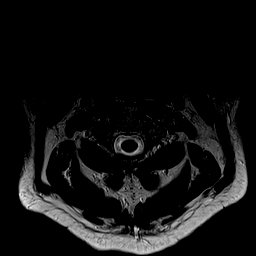

[Series 6: GRE · axial · 3.0mm · 0.35mm/px · z∈[-72,-31]mm · 4 of 28 slices shown]
[im 1/28]
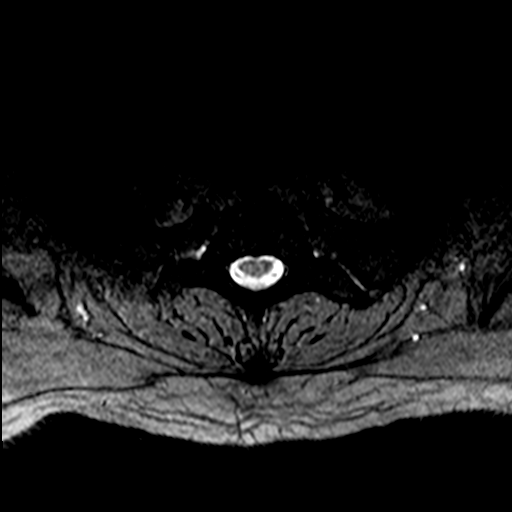
[im 5/28]
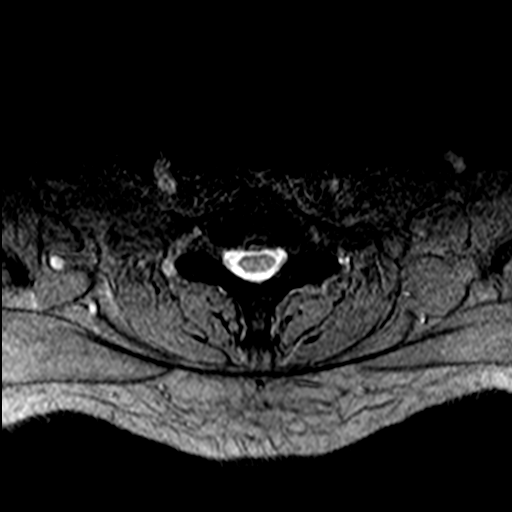
[im 10/28]
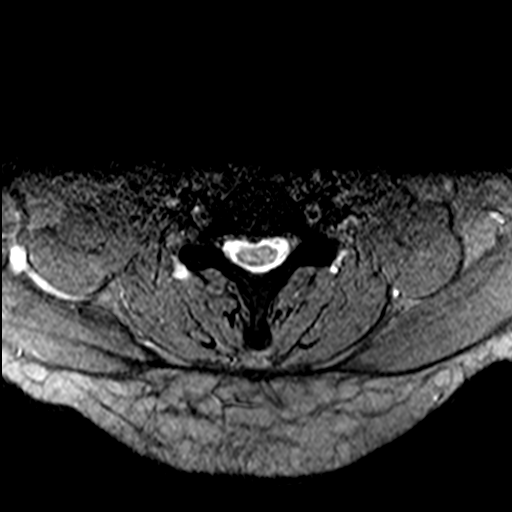
[im 12/28]
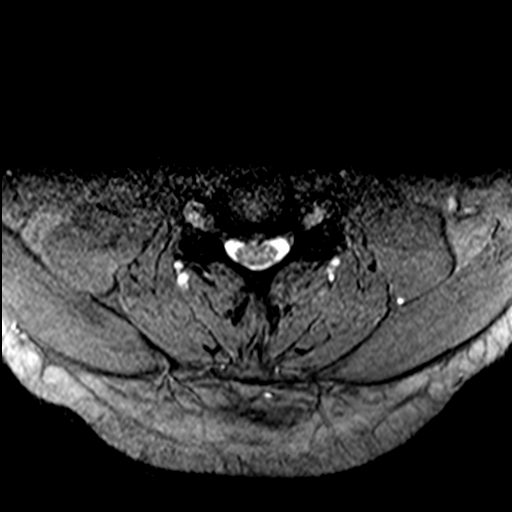

[35 of 48 positions shown; findings below may reference images not displayed]

FINDINGS: Alignment: Normal.

Vertebrae: No fracture, suspicious osseous lesion, or significant
marrow edema. Mild diffuse cervical spinal canal narrowing on a
congenital basis.

Cord: Normal signal.

Posterior Fossa, vertebral arteries, paraspinal tissues:
Unremarkable.

Disc levels:

C2-3: Right uncovertebral spurring and mild right facet arthrosis
result in moderate right neural foraminal stenosis. There is a small
central disc protrusion without significant spinal stenosis.

C3-4: A small central disc protrusion results in borderline spinal
stenosis. Mild disc bulging, uncovertebral spurring, and mild
bilateral facet arthrosis result in moderate right and moderate to
severe left neural foraminal stenosis.

C4-5: A small central disc protrusion result in mild spinal stenosis
with mild cord flattening. Disc bulging, uncovertebral spurring, and
mild facet arthrosis result in mild-to-moderate bilateral neural
foraminal stenosis.

C5-6: A small central disc protrusion and infolding of the
ligamentum flavum result in moderate spinal stenosis with mild cord
flattening. Mild disc bulging, mild uncovertebral spurring, and mild
facet arthrosis result in mild right neural foraminal stenosis.

C6-7: A small right paracentral disc protrusion results in
mild-to-moderate spinal stenosis with mild cord flattening. Patent
neural foramina.

C7-T1: Mild disc bulging, endplate spurring, and facet arthrosis
crash that a mild facet arthrosis result and mild left neural
foraminal stenosis without spinal stenosis.
IMPRESSION: 1. Cervical spondylosis most notable at C5-6 where there is moderate
spinal stenosis.
2. Mild-to-moderate spinal stenosis at C6-7.
3. Multilevel neural foraminal stenosis as above, moderate to severe
at C3-4.

## 2021-03-10 ENCOUNTER — Other Ambulatory Visit: Payer: Self-pay | Admitting: Physician Assistant

## 2021-03-11 ENCOUNTER — Other Ambulatory Visit (HOSPITAL_COMMUNITY): Payer: Self-pay

## 2021-03-12 ENCOUNTER — Other Ambulatory Visit (HOSPITAL_COMMUNITY): Payer: Self-pay

## 2021-03-23 ENCOUNTER — Ambulatory Visit: Payer: 59 | Admitting: Cardiology

## 2021-03-24 ENCOUNTER — Encounter: Payer: 59 | Admitting: Physician Assistant

## 2021-03-25 NOTE — Progress Notes (Signed)
This encounter was created in error - please disregard.

## 2021-03-31 ENCOUNTER — Telehealth: Payer: Self-pay | Admitting: Cardiology

## 2021-03-31 NOTE — Telephone Encounter (Signed)
Spoke with pt, he was told by the pharmacy his brilinta was on hold until he was seen by dr [place]iland. Aware not sure what happened but will forward to dr [place] to see if he needs to restart the brilinta. His procedure was June 2021. Aware will forward to dr [place] to review and advise.

## 2021-03-31 NOTE — Telephone Encounter (Signed)
He doesn't need to resume Brilinta since he is more than a year out from his stent. Stay on ASA  Dimitri Dsouza [place] MD, [facility]

## 2021-03-31 NOTE — Telephone Encounter (Signed)
Pt c/o medication issue:  1. Name of Medication: Brilinta  2. How are you currently taking this medication (dosage and times per day)? Not taking at this time  3. Are you having a reaction (difficulty breathing--STAT)?  No  4. What is your medication issue? Patient ran out of meds a couple weeks ago and the pharmacy said they couldn't fill it so he has been off the medication for a couple weeks. He would like to know if he is to discontinue this or if he needs to go back on it. If so, he will need a new prescription

## 2021-04-01 NOTE — Telephone Encounter (Signed)
Called patient no answer.Unable to leave a message voice mail is full. 

## 2021-04-02 NOTE — Telephone Encounter (Signed)
Spoke with pt, aware of dr jordan's recommendations. 

## 2021-04-27 ENCOUNTER — Ambulatory Visit (INDEPENDENT_AMBULATORY_CARE_PROVIDER_SITE_OTHER): Payer: 59

## 2021-04-27 ENCOUNTER — Encounter: Payer: Self-pay | Admitting: Podiatry

## 2021-04-27 ENCOUNTER — Ambulatory Visit (INDEPENDENT_AMBULATORY_CARE_PROVIDER_SITE_OTHER): Payer: 59 | Admitting: Podiatry

## 2021-04-27 ENCOUNTER — Other Ambulatory Visit: Payer: Self-pay

## 2021-04-27 DIAGNOSIS — M2041 Other hammer toe(s) (acquired), right foot: Secondary | ICD-10-CM | POA: Diagnosis not present

## 2021-04-27 DIAGNOSIS — M779 Enthesopathy, unspecified: Secondary | ICD-10-CM

## 2021-04-27 DIAGNOSIS — M21611 Bunion of right foot: Secondary | ICD-10-CM | POA: Diagnosis not present

## 2021-04-27 DIAGNOSIS — M21619 Bunion of unspecified foot: Secondary | ICD-10-CM

## 2021-04-27 DIAGNOSIS — M2042 Other hammer toe(s) (acquired), left foot: Secondary | ICD-10-CM | POA: Diagnosis not present

## 2021-04-27 DIAGNOSIS — M21612 Bunion of left foot: Secondary | ICD-10-CM

## 2021-04-27 HISTORY — PX: FOOT SURGERY: SHX648

## 2021-04-27 NOTE — Progress Notes (Deleted)
Dg  

## 2021-04-27 NOTE — Progress Notes (Signed)
Subjective:   Patient ID: Stephen Scott, male   DOB: 60 y.o.   MRN: [id number]   HPI Patient presents with very painful bunion on the left foot and a rigidly contracted second toe pain in the second metatarsal joint and deviation of the hallux against the second toe left over right foot.  States this is been ongoing and getting worse and making it increasingly hard to wear shoe and is tried to modify shoe gear he is tried soaks and padding for the toe without relief of symptoms.  Does not smoke likes to be active   Review of Systems  All other systems reviewed and are negative.      Objective:  Physical Exam Vitals and nursing note reviewed.  Constitutional:      Appearance: He is well-developed.  Pulmonary:     Effort: Pulmonary effort is normal.  Musculoskeletal:        General: Normal range of motion.  Skin:    General: Skin is warm.  Neurological:     Mental Status: He is alert.    Neurovascular status found to be intact muscle strength was found to be adequate range of motion within normal limits.  Patient is noted to have large bunion deformity of the left over right foot with deviation of the hallux against the second toe a rigidly contracted second digit left over right foot and inflammation pain of the second metatarsal phalangeal joint left.  Patient is found to have good digital perfusion well oriented x3     Assessment:  Structural malalignment left over right foot with rigid contracture of lesser digit with pain and inflammatory capsulitis left over right     Plan:  H&P all x-rays reviewed condition discussed at great length.  I have recommended due to the intensity discomfort rigid contracture of toe failure to respond conservatively to surgical intervention including distal osteotomy left first metatarsal possible Akin osteotomy digital fusion shortening osteotomy second left foot.  I reviewed the consent form and allow the patient to go over reviewing alternative  treatments complications.  He wants surgery and after review signed consent form scheduled for outpatient surgery encouraged to call with all questions and is scheduled at the surgical center to have this foot worked on.  Understands total recovery takes approximately 6 months air fracture walker dispensed today and I wanted to get used to it prior to procedure and he is encouraged to call questions concerns prior to procedure  X-rays indicate significant elevation of the intermetatarsal angle of approximate 15 degrees movement of the left hallux rigid contracture digit to left over right and elongated second metatarsal bilateral

## 2021-05-01 ENCOUNTER — Telehealth: Payer: Self-pay | Admitting: Urology

## 2021-05-01 ENCOUNTER — Encounter: Payer: Self-pay | Admitting: Podiatry

## 2021-05-01 ENCOUNTER — Telehealth: Payer: Self-pay | Admitting: *Deleted

## 2021-05-01 NOTE — Telephone Encounter (Signed)
   [facility] Pre-operative Risk Assessment    Patient Name: Stephen Scott  DOB: 1961/09/01 MRN: [id number]  HEARTCARE STAFF:  - IMPORTANT!!!!!! Under Visit Info/Reason for Call, type in Other and utilize the format Clearance MM/DD/YY or Clearance TBD. Do not use dashes or single digits. - Please review there is not already an duplicate clearance open for this procedure. - If request is for dental extraction, please clarify the # of teeth to be extracted. - If the patient is currently at the dentist's office, call Pre-Op Callback Staff (MA/nurse) to input urgent request.  - If the patient is not currently in the dentist office, please route to the Pre-Op pool.  Request for surgical clearance:  What type of surgery is being performed? Distal osteotomy left first metatarsal possible  Akin osteotomy  When is this surgery scheduled? 05/12/21  What type of clearance is required (medical clearance vs. Pharmacy clearance to hold med vs. Both)? medical  Are there any medications that need to be held prior to surgery and how long? none  Practice name and name of physician performing surgery? Ila Mcgill DPM  What is the office phone number? [phone]   7.   What is the office fax number? [phone]  8.   Anesthesia type (None, local, MAC, general) ? general   Fredia Beets 05/01/2021, 4:39 PM  _________________________________________________________________   (provider comments below)

## 2021-05-01 NOTE — Telephone Encounter (Signed)
DOS - 05/12/21  [place] BUNIONECTOMY LEFT --- 09927 MET OSTEOTOMY 2ND LEFT --- 80044 HAMMERTOE REPAIR 2ND LEFT --- [id number]   [facility] EFFECTIVE DATE - 12/26/20   PLAN DEDUCTIBLE - $5,000.00 W/ $6,386.85 REMAINING OUT OF POCKET - $6,500.00 W/  $4,883.01 REMAINING COINSURANCE - 30% COPAY - $0.00   PER [facility] SITE CPT CODES [id number], [phone] AND [phone] HAVE BEEN APPROVED, AUTH # [id number].

## 2021-05-04 NOTE — Telephone Encounter (Signed)
   Primary Cardiologist: Peter Swaziland, MD  Chart reviewed as part of pre-operative protocol coverage. Given past medical history and time since last visit, based on ACC/AHA guidelines, Stephen Scott would be at acceptable risk for the planned procedure without further cardiovascular testing.   He is able to complete greater than 4 METS of physical activity.  Patient was advised that if he develops new symptoms prior to surgery to contact our office to arrange a follow-up appointment.  He verbalized understanding.  I will route this recommendation to the requesting party via Epic fax function and remove from pre-op pool.  Please call with questions.  Thomasene Ripple. Arianis Bowditch NP-C    05/04/2021, 9:11 AM [facility] [address] Office [phone] Fax [phone]

## 2021-05-04 NOTE — Telephone Encounter (Signed)
Spoke to patient 8/17 appointment with Stephen Fabian NP cancelled and rescheduled 9/12 at 2:45 pm.

## 2021-05-11 MED ORDER — ONDANSETRON HCL 4 MG PO TABS: 4.0000 mg | ORAL_TABLET | Freq: Three times a day (TID) | ORAL | 0 refills | Status: DC | PRN

## 2021-05-11 MED ORDER — OXYCODONE-ACETAMINOPHEN 10-325 MG PO TABS: 1.0000 | ORAL_TABLET | ORAL | 0 refills | Status: DC | PRN

## 2021-05-11 NOTE — Addendum Note (Signed)
Addended by: Lenn Sink on: 05/11/2021 01:29 PM   Modules accepted: Orders

## 2021-05-12 ENCOUNTER — Telehealth: Payer: Self-pay | Admitting: *Deleted

## 2021-05-12 ENCOUNTER — Encounter: Payer: Self-pay | Admitting: Podiatry

## 2021-05-12 DIAGNOSIS — M2012 Hallux valgus (acquired), left foot: Secondary | ICD-10-CM | POA: Diagnosis not present

## 2021-05-12 DIAGNOSIS — M2042 Other hammer toe(s) (acquired), left foot: Secondary | ICD-10-CM | POA: Diagnosis not present

## 2021-05-12 DIAGNOSIS — M21542 Acquired clubfoot, left foot: Secondary | ICD-10-CM | POA: Diagnosis not present

## 2021-05-12 NOTE — Telephone Encounter (Signed)
Patient's wife is requesting a letter written saying that the surgery done today was necessary, problem was stopping him from doing his job. Please fax to:[phone], Attn. Joe. She may be reached at :[phone].

## 2021-05-13 ENCOUNTER — Ambulatory Visit: Payer: 59 | Admitting: General Practice

## 2021-05-13 NOTE — Telephone Encounter (Signed)
You can send letter to above today

## 2021-05-15 ENCOUNTER — Encounter: Payer: Self-pay | Admitting: *Deleted

## 2021-05-15 NOTE — Telephone Encounter (Signed)
Patient's letter request has been approved,written and faxed to employer.The patient is aware.

## 2021-05-15 NOTE — Telephone Encounter (Signed)
Faxed letter to employer([employer])per patient's request on 05/15/21,received confirmation, patient aware.

## 2021-05-18 ENCOUNTER — Ambulatory Visit (INDEPENDENT_AMBULATORY_CARE_PROVIDER_SITE_OTHER): Payer: 59

## 2021-05-18 ENCOUNTER — Other Ambulatory Visit: Payer: Self-pay

## 2021-05-18 ENCOUNTER — Encounter: Payer: Self-pay | Admitting: Podiatry

## 2021-05-18 ENCOUNTER — Ambulatory Visit (INDEPENDENT_AMBULATORY_CARE_PROVIDER_SITE_OTHER): Payer: 59 | Admitting: Podiatry

## 2021-05-18 DIAGNOSIS — Z9889 Other specified postprocedural states: Secondary | ICD-10-CM

## 2021-05-18 DIAGNOSIS — M2042 Other hammer toe(s) (acquired), left foot: Secondary | ICD-10-CM | POA: Diagnosis not present

## 2021-05-20 NOTE — Progress Notes (Signed)
Subjective:   Patient ID: Stephen Scott, male   DOB: 60 y.o.   MRN: [id number]   HPI Patient presents stating I am doing very well minimal discomfort able to walk on his foot with boot and keeping his foot elevated   ROS      Objective:  Physical Exam  Neurovascular status intact negative Denna Haggard' sign noted wound edges well coapted pin in place good structural alignment first metatarsal with no drainage noted     Assessment:  Doing well post forefoot reconstruction left foot     Plan:  H&P x-ray reviewed and sterile dressing reapplied and instructed on continued elevation compression immobilization and dispensed surgical shoe that he may gradually start wearing.  Encouraged him to let us know if any issues were to occur and we will see him back in the next several weeks and will need pin removal and was instructed on lowering the second toe  X-rays indicate osteotomies healing well fixation in place alignment good

## 2021-05-22 ENCOUNTER — Telehealth: Payer: Self-pay | Admitting: *Deleted

## 2021-05-22 NOTE — Telephone Encounter (Signed)
As long as pin is not out to far don't worry. A little bit of bleeding is ok

## 2021-05-22 NOTE — Telephone Encounter (Signed)
Patient is calling because incision has bleed thru the bandage , thru out the night,feels that his pin appears to have come out more than it should since last visit .Does he need to come in to have checked?Please advise.

## 2021-05-22 NOTE — Telephone Encounter (Signed)
Returned the call to patient and gave recommendations per Dr Charlsie Merles, verbalized understanding.He has questions, should he be going back to work on Monday since appears pin in toe is moving? He is also requesting a refill of his pain medicine, almost out.Please advise.

## 2021-05-25 ENCOUNTER — Ambulatory Visit (INDEPENDENT_AMBULATORY_CARE_PROVIDER_SITE_OTHER): Payer: 59

## 2021-05-25 ENCOUNTER — Other Ambulatory Visit: Payer: Self-pay

## 2021-05-25 ENCOUNTER — Ambulatory Visit (INDEPENDENT_AMBULATORY_CARE_PROVIDER_SITE_OTHER): Payer: 59 | Admitting: Podiatry

## 2021-05-25 ENCOUNTER — Encounter: Payer: Self-pay | Admitting: Podiatry

## 2021-05-25 ENCOUNTER — Telehealth: Payer: Self-pay | Admitting: *Deleted

## 2021-05-25 DIAGNOSIS — Z09 Encounter for follow-up examination after completed treatment for conditions other than malignant neoplasm: Secondary | ICD-10-CM | POA: Insufficient documentation

## 2021-05-25 DIAGNOSIS — T84498D Other mechanical complication of other internal orthopedic devices, implants and grafts, subsequent encounter: Secondary | ICD-10-CM

## 2021-05-25 DIAGNOSIS — Z9889 Other specified postprocedural states: Secondary | ICD-10-CM

## 2021-05-25 DIAGNOSIS — T84498A Other mechanical complication of other internal orthopedic devices, implants and grafts, initial encounter: Secondary | ICD-10-CM | POA: Insufficient documentation

## 2021-05-25 NOTE — Telephone Encounter (Signed)
Patient is almost out of pain medicine. Requesting a refill. Please advise.

## 2021-05-25 NOTE — Telephone Encounter (Signed)
Patient has called back expressing his concerns about his sx foot, can someone please call. The Pin has come out a little further since Thursday. He also wanted to request pain meds as well.  Ammie to you mind calling him?

## 2021-05-25 NOTE — Telephone Encounter (Signed)
complete

## 2021-05-25 NOTE — Telephone Encounter (Signed)
Patient is coming in today at 4:30  to see Dr Stacie Acres to have his pin looked at,thinks it may be coming out,he is very concerned that it's coming out more since weekend.

## 2021-05-25 NOTE — Progress Notes (Signed)
This patient presents to the office with complaint of wire second toe left foot becoming  a problem since the pin is backing out of the toe.  Patient says there was no trauma and the pin was positioned properly one week ago during a visit with Dr.  Charlsie Merles.  He is a surgical patient who had an [place] bunionectomy, hammer toe repair second toe with pin fixation and second metatarsal osteotomy with screw fixation on 05/12/21.  He says his foot has been throbbing since he discontinued taking his pain medicine.  He denies any chills or fever.  He presents to the office for evaluation  and treatment of the surgical site.  Patient presents to the office with cam walker.  He presents to the office with his wife.    Neurovascular status is WNL.  The k-wire has backed out of the second toe.  The incision sites were healing with good wound coaption.  Swelling noted on the dorsum of left foot.     Pin irritation  Post of visit.  ROV.  Xray was taken revealing the K-wire position proximal to the PIPJ second toe right foot.  I consulted with Dr.  Logan Bores and we decided to try to push the wire back into the toe or remove the wire.  I had previously tried to push the wire back into the toe unsuccessfully.  Dr.  Logan Bores attempted to put the K-wire into the toe but was unable to move the wire.  Therefore he removed the pin.  Prior to the xray I removed the bandage that he was wearing and rebandaged the surgical foot with betadine/DSD and coban.  Care was taken to plantar flex the second toe in this bandage. He said the pain he had previously experienced had improved.  I told him to check with Dr.  Charlsie Merles for further instructions on foot care. RTC prn   Helane Gunther DPM

## 2021-05-25 NOTE — Telephone Encounter (Signed)
He is coming in at 4:30 to have Dr Stacie Acres to look at,can you add him,he ok'd

## 2021-05-26 NOTE — Telephone Encounter (Signed)
Taken care of. - Dr. Logan Bores

## 2021-05-28 ENCOUNTER — Other Ambulatory Visit: Payer: Self-pay | Admitting: Neurosurgery

## 2021-05-28 ENCOUNTER — Ambulatory Visit: Payer: 59 | Admitting: General Practice

## 2021-05-28 DIAGNOSIS — M48062 Spinal stenosis, lumbar region with neurogenic claudication: Secondary | ICD-10-CM

## 2021-06-05 NOTE — Progress Notes (Signed)
Cardiology Clinic Note   Patient Name: Stephen Scott Date of Encounter: 06/08/2021  Primary Care Provider:  Aliene Beams, MD Primary Cardiologist:  Peter Swaziland, MD  Patient Profile    Stephen Scott 60 year old male presents the clinic today for follow-up evaluation of his coronary artery disease and hyperlipidemia.  Past Medical History    Past Medical History:  Diagnosis Date   Anemia    as a small child   Anxiety    Arthritis    "lower spine; knees" (11/01/2017)   Chronic lower back pain    Depression    Family history of adverse reaction to anesthesia    "daughter PONV"   GERD (gastroesophageal reflux disease)    High cholesterol    History of kidney stones    Hypertension    Pneumonia 06/2017; 08/2017   walking pneumonia; treated   Spinal stenosis    "lower back" (11/01/2017)   Past Surgical History:  Procedure Laterality Date   CORONARY STENT INTERVENTION N/A 03/04/2020   Procedure: CORONARY STENT INTERVENTION;  Surgeon: Swaziland, Peter M, MD;  Location: [facility] INVASIVE CV LAB;  Service: Cardiovascular;  Laterality: N/A;   HAND RECONSTRUCTION Left 1989   "injured when washing dishes"   INTRAVASCULAR PRESSURE WIRE/FFR STUDY N/A 03/04/2020   Procedure: INTRAVASCULAR PRESSURE WIRE/FFR STUDY;  Surgeon: Swaziland, Peter M, MD;  Location: [facility] INVASIVE CV LAB;  Service: Cardiovascular;  Laterality: N/A;   INTRAVASCULAR ULTRASOUND/IVUS N/A 03/04/2020   Procedure: Intravascular Ultrasound/IVUS;  Surgeon: Swaziland, Peter M, MD;  Location: [facility] INVASIVE CV LAB;  Service: Cardiovascular;  Laterality: N/A;   JOINT REPLACEMENT     KNEE ARTHROSCOPY Right 2008   LEFT HEART CATH AND CORONARY ANGIOGRAPHY N/A 03/04/2020   Procedure: LEFT HEART CATH AND CORONARY ANGIOGRAPHY;  Surgeon: Swaziland, Peter M, MD;  Location: [facility] INVASIVE CV LAB;  Service: Cardiovascular;  Laterality: N/A;   TOTAL KNEE ARTHROPLASTY Right 11/01/2017   TOTAL KNEE ARTHROPLASTY Right 11/01/2017   Procedure: RIGHT TOTAL KNEE  ARTHROPLASTY;  Surgeon: Kathryne Hitch, MD;  Location: [facility] OR;  Service: Orthopedics;  Laterality: Right;   WISDOM TOOTH EXTRACTION  1981    Allergies  Allergies  Allergen Reactions   Iodides Anaphylaxis, Hives and Other (See Comments)    Immediate reaction, looks like chicken pox   Tetanus Toxoids Other (See Comments)    Swelling and tenderness in arm > Local reaction. Older version of tetanus shot, tolerates the newer version     History of Present Illness    Stephen Scott has a PMH of unstable angina, essential hypertension, HLD, coronary artery disease, chronic low back pain, chronic right knee pain, and internal fixator device.  He presented on 6/21 with unstable angina.  He underwent cardiac catheterization which showed severe circumflex disease.  He underwent successful PCI with DES.  He lost around 40 pounds with dietary changes.  He was going to cardiac rehab and felt well.  He reported that his stress level since December have been elevated.  He noted that his blood pressure had increased.  He had also not been eating well and had gained around 20 pounds back.  He has been eating more salty foods.  He indicated that during prior week on a stressful day his blood pressure was 170/100.  He felt bad with his increased blood pressure.  He reported headache and left temporal pain.  He felt flushed.  He also noted some burning in his throat and pains.  He denied jaw pain which was his  previous anginal equivalent.  He had not gone to cardiac rehab in a month.  Dr. Swaziland placed him on chlorthalidone 12.5 mg daily.  He was encouraged to make dietary and lifestyle modifications.  On follow-up after having chlorthalidone increased to 25 mg daily as blood pressure was 117-1 120/60-70.  He continued to try to eat better.  He reported that he was exercising again with walking and stationary biking.  He continued to have increased stress at work and with his personal life.  He reported seeing  a Veterinary surgeon.  His previous symptoms have improved.  He denied sublingual nitroglycerin use.  He did note one occasion of jaw pain.  He presents the clinic today for follow-up evaluation states he has noticed increased fatigue and palpitations over the last 2 weeks.  He reports that he has increased stress at work due to employee concentrations.  He underwent left foot surgery 4 weeks ago.  He has gained weight due to his inability to stay physically active related to his left foot and his back/sciatica pain.  We reviewed his previous angiography and anginal equivalents.  He does report increased caffeine and chocolate intake which she feels is contributing to his palpitations.  He presents with his wife who reports that he will come home from work sleep for 2 hours, snores and then is not able to sleep well at night.  I will order a sleep study, cardiac event monitor, echocardiogram, and have him continue to increase his physical activity as tolerated.  His Epworth sleep score is 7.  We will plan follow-up for 10 to 12 weeks.  He reports that he has been working with his PCP to help with medications for his depression.  Today he denies chest pain, shortness of breath, lower extremity edema, melena, hematuria, hemoptysis, diaphoresis, weakness, presyncope, syncope, orthopnea, and PND.   Home Medications    Prior to Admission medications   Medication Sig Start Date End Date Taking? Authorizing Provider  amLODipine (NORVASC) 10 MG tablet Take 10 mg by mouth daily.    [provider]  aspirin EC 81 MG tablet Take 1 tablet (81 mg total) by mouth daily. 02/29/20   Swaziland, Peter M, MD  atorvastatin (LIPITOR) 80 MG tablet TAKE 1 TABLET(80 MG) BY MOUTH DAILY 11/25/20   Swaziland, Peter M, MD  chlorthalidone (HYGROTON) 25 MG tablet Take 1 tablet (25 mg total) by mouth daily. 12/01/20 11/26/21  Swaziland, Peter M, MD  famotidine (PEPCID) 20 MG tablet Take 20 mg by mouth at bedtime as needed for heartburn.      [provider]  loratadine (CLARITIN) 10 MG tablet Take 10 mg by mouth daily as needed for allergies.     [provider]  losartan (COZAAR) 100 MG tablet Take 100 mg by mouth daily.    [provider]  nitroGLYCERIN (NITROSTAT) 0.4 MG SL tablet Place 1 tablet (0.4 mg total) under the tongue every 5 (five) minutes as needed for chest pain. 11/14/20 02/12/21  Swaziland, Peter M, MD  ondansetron (ZOFRAN) 4 MG tablet Take 1 tablet (4 mg total) by mouth every 8 (eight) hours as needed for nausea or vomiting. 05/11/21   Regal, Kirstie Peri, DPM  oxyCODONE-acetaminophen (PERCOCET) 10-325 MG tablet Take 1 tablet by mouth every 4 (four) hours as needed for pain. 05/11/21   Lenn Sink, DPM  Polyethyl Glycol-Propyl Glycol (SYSTANE OP) Place 1 drop into both eyes daily as needed (dry eyes).    [provider]  Family History    Family History  Problem Relation Age of Onset   Clotting disorder Mother    Pulmonary embolism Mother    Diabetes Mother    Hyperlipidemia Mother    Hypertension Mother    Heart disease Brother        stent   Heart attack Brother    He indicated that his mother is deceased. He indicated that his father is deceased. He indicated that only one of his two brothers is alive.  Social History    Social History   Socioeconomic History   Marital status: Married    Spouse name: Not on file   Number of children: 2   Years of education: Not on file   Highest education level: Not on file  Occupational History    Employer: [employer]  Tobacco Use   Smoking status: Never   Smokeless tobacco: Never  Vaping Use   Vaping Use: Never used  Substance and Sexual Activity   Alcohol use: Yes    Alcohol/week: 3.0 standard drinks    Types: 3 Glasses of wine per week   Drug use: No   Sexual activity: Not on file  Other Topics Concern   Not on file  Social History Narrative   Not on file   Social Determinants of Health    Financial Resource Strain: Not on file  Food Insecurity: Not on file  Transportation Needs: Not on file  Physical Activity: Not on file  Stress: Not on file  Social Connections: Not on file  Intimate Partner Violence: Not on file     Review of Systems    General:  No chills, fever, night sweats or weight changes.  Cardiovascular:  No chest pain, dyspnea on exertion, edema, orthopnea, palpitations, paroxysmal nocturnal dyspnea. Dermatological: No rash, lesions/masses Respiratory: No cough, dyspnea Urologic: No hematuria, dysuria Abdominal:   No nausea, vomiting, diarrhea, bright red blood per rectum, melena, or hematemesis Neurologic:  No visual changes, wkns, changes in mental status. All other systems reviewed and are otherwise negative except as noted above.  Physical Exam    VS:  BP 120/78 (BP Location: Right Arm)   Pulse 64   Ht 5\' 11"  (1.803 m)   Wt 272 lb 9.6 oz (123.7 kg)   SpO2 98%   BMI 38.02 kg/m  , BMI Body mass index is 38.02 kg/m. GEN: Well nourished, well developed, in no acute distress. HEENT: normal. Neck: Supple, no JVD, carotid bruits, or masses. Cardiac: RRR, no murmurs, rubs, or gallops. No clubbing, cyanosis, edema.  Radials/DP/PT 2+ and equal bilaterally.  Respiratory:  Respirations regular and unlabored, clear to auscultation bilaterally. GI: Soft, nontender, nondistended, BS + x 4. MS: no deformity or atrophy. Skin: warm and dry, no rash. Neuro:  Strength and sensation are intact. Psych: Normal affect.  Accessory Clinical Findings    Recent Labs: 11/14/2020: ALT 33; Hemoglobin 14.2; Platelets 216 12/01/2020: BUN 15; Creatinine, Ser 0.74; Potassium 3.7; Sodium 139   Recent Lipid Panel    Component Value Date/Time   CHOL 174 11/14/2020 1513   TRIG 113 11/14/2020 1513   HDL 52 11/14/2020 1513   CHOLHDL 3.3 11/14/2020 1513   LDLCALC 102 (H) 11/14/2020 1513    ECG personally reviewed by me today-normal sinus rhythm 64 bpm- No acute  changes  Cardiac catheterization 03/04/2020  Prox Cx to Mid Cx lesion is 80% stenosed. Post intervention, there is a 0% residual stenosis. A drug-eluting stent was successfully  placed using a STENT RESOLUTE ONYX 3.5X22. The left ventricular systolic function is normal. LV end diastolic pressure is normal. The left ventricular ejection fraction is 55-65% by visual estimate.   1. Single vessel obstructive CAD involving the LCx. 2. Normal LV function 3. Normal LVEDP 4. Successful PCI of the LCx using FFR and IVUS guidance with DES x 1.   Plan: anticipate same day DC. DAPT with ASA and Brilinta x 1 year.   Diagnostic Dominance: Right Intervention    Assessment & Plan   1.  Coronary artery disease-reports sensation of palpitations and throat/neck sensation.  Symptoms are made better with rest.  It appears that some of his symptoms may be related to deconditioning and increased weight.  Status post cardiac catheterization with DES to circumflex 6/21.  Anginal equivalent was jaw pain. Continue aspirin, atorvastatin, amlodipine, nitroglycerin Heart healthy low-sodium diet-salty 6 given Increase physical activity as tolerated Order echocardiogram Order cardiac event monitor  Essential hypertension-BP today 120/78.  Well-controlled at home. Continue chlorthalidone, losartan, amlodipine Heart healthy low-sodium diet-salty 6 given Increase physical activity as tolerated  Hyperlipidemia-11/14/2020: Cholesterol, Total 174; HDL 52; LDL Chol Calc (NIH) 102; Triglycerides 113 Continue atorvastatin, aspirin Heart healthy low-sodium high-fiber diet.   Increase physical activity as tolerated  Obesity-weight today 272.6 pounds.  Previously lost around 40 pounds which showed an improvement in his blood pressure.  Then had an increase of 20 pounds in the setting of poor diet and less physical activity. Continue heart healthy low-sodium diet Increase physical activity as tolerated-goal 150 minutes  of moderate physical activity per week.  Situational stress-reports that his stress level has increased since going back to work.  Snoring/daytime somnolence/fatigue-reports that he has less endurance and is noticing increased fatigue since going back to work. Epworth sleep scale 7, order split-night sleep study,   Disposition: Follow-up with Dr. Swaziland in 10 to 12 weeks   Thomasene Ripple. Kyriana Yankee NP-C    06/08/2021, 4:15 PM [facility] [address] Office [phone] Fax [phone]  Notice: This dictation was prepared with Dragon dictation along with smaller phrase technology. Any transcriptional errors that result from this process are unintentional and may not be corrected upon review.  I spent 14 minutes examining this patient, reviewing medications, and using patient centered shared decision making involving her cardiac care.  Prior to her visit I spent greater than 20 minutes reviewing her past medical history,  medications, and prior cardiac tests.

## 2021-06-08 ENCOUNTER — Encounter: Payer: Self-pay | Admitting: General Practice

## 2021-06-08 ENCOUNTER — Other Ambulatory Visit: Payer: Self-pay

## 2021-06-08 ENCOUNTER — Ambulatory Visit
Admission: RE | Admit: 2021-06-08 | Discharge: 2021-06-08 | Disposition: A | Discharge status: Home / Self Care | Payer: 59 | Source: Ambulatory Visit | Attending: Neurosurgery | Admitting: Neurosurgery

## 2021-06-08 ENCOUNTER — Telehealth: Payer: Self-pay | Admitting: Podiatry

## 2021-06-08 ENCOUNTER — Ambulatory Visit (INDEPENDENT_AMBULATORY_CARE_PROVIDER_SITE_OTHER): Payer: 59

## 2021-06-08 ENCOUNTER — Ambulatory Visit (INDEPENDENT_AMBULATORY_CARE_PROVIDER_SITE_OTHER): Payer: 59 | Admitting: General Practice

## 2021-06-08 ENCOUNTER — Encounter: Payer: Self-pay | Admitting: Podiatry

## 2021-06-08 VITALS — BP 120/78 | HR 64 | Ht 71.0 in | Wt 272.6 lb

## 2021-06-08 DIAGNOSIS — F439 Reaction to severe stress, unspecified: Secondary | ICD-10-CM

## 2021-06-08 DIAGNOSIS — I2510 Atherosclerotic heart disease of native coronary artery without angina pectoris: Secondary | ICD-10-CM

## 2021-06-08 DIAGNOSIS — I10 Essential (primary) hypertension: Secondary | ICD-10-CM | POA: Diagnosis not present

## 2021-06-08 DIAGNOSIS — R002 Palpitations: Secondary | ICD-10-CM

## 2021-06-08 DIAGNOSIS — M48062 Spinal stenosis, lumbar region with neurogenic claudication: Secondary | ICD-10-CM

## 2021-06-08 DIAGNOSIS — R0600 Dyspnea, unspecified: Secondary | ICD-10-CM

## 2021-06-08 DIAGNOSIS — R400 Somnolence: Secondary | ICD-10-CM

## 2021-06-08 DIAGNOSIS — R5383 Other fatigue: Secondary | ICD-10-CM

## 2021-06-08 DIAGNOSIS — Z9861 Coronary angioplasty status: Secondary | ICD-10-CM

## 2021-06-08 DIAGNOSIS — E785 Hyperlipidemia, unspecified: Secondary | ICD-10-CM

## 2021-06-08 DIAGNOSIS — R0609 Other forms of dyspnea: Secondary | ICD-10-CM

## 2021-06-08 NOTE — Telephone Encounter (Signed)
Stephen Scott returned to work on 05/27/2021, he would like a note with, what you think he should not do at work.   Other than no heavy lifting. Please advise? Patient would like to have note faxed over by 5:00pm 9/12.

## 2021-06-08 NOTE — Patient Instructions (Signed)
Medication Instructions:  The current medical regimen is effective;  continue present plan and medications as directed. Please refer to the Current Medication list given to you today.  *If you need a refill on your cardiac medications before your next appointment, please call your pharmacy*  Lab Work: NONE  Testing/Procedures: Echocardiogram - Your physician has requested that you have an echocardiogram. Echocardiography is a painless test that uses sound waves to create images of your heart. It provides your doctor with information about the size and shape of your heart and how well your heart's chambers and valves are working. This procedure takes approximately one hour. There are no restrictions for this procedure. This will be performed at our [facility] location - [address].  Your physician has recommended that you have a sleep study. This test records several body functions during sleep, including: brain activity, eye movement, oxygen and carbon dioxide blood levels, heart rate and rhythm, breathing rate and rhythm, the flow of air through your mouth and nose, snoring, body muscle movements, and chest and belly movement.  Your physician has requested you wear a ZIO patch monitor for 14 days. SEE ATTACHED DIRECTIONS  Follow-Up: Your next appointment:  10-12 week(s) In Person with Peter Swaziland, MD OR IF UNAVAILABLE JESSE CLEAVER, FNP-C   At [facility], you and your health needs are our priority.  As part of our continuing mission to provide you with exceptional heart care, we have created designated Provider Care Teams.  These Care Teams include your primary Cardiologist (physician) and Advanced Practice Providers (APPs -  Physician Assistants and Nurse Practitioners) who all work together to provide you with the care you need, when you need it.     Your physician has requested you wear a ZIO patch monitor for 14 days. This is a single patch monitor. [employer] supplies  one patch monitor per enrollment. Additional stickers are not available. Please do not apply patch if you will be having a Nuclear Stress Test,  Echocardiogram, Cardiac CT, MRI, or Chest Xray during the period you would be wearing the  monitor. The patch cannot be worn during these tests. You cannot remove and re-apply the  ZIO XT patch monitor.  Your ZIO patch monitor will be mailed 3 day USPS to your address on file. It may take 3-5 days  to receive your monitor after you have been enrolled.  Once you have received your monitor, please review the enclosed instructions. Your monitor  has already been registered assigning a specific monitor serial # to you.  Billing and Patient Assistance Program Information  We have supplied [employer] with any of your insurance information on file for billing purposes. [employer] offers a sliding scale Patient Assistance Program for patients that do not have  insurance, or whose insurance does not completely cover the cost of the ZIO monitor.  You must apply for the Patient Assistance Program to qualify for this discounted rate.  To apply, please call [employer] at [phone], select option 4, select option 2, ask to apply for  Patient Assistance Program. Meredeth Ide will ask your household income, and how many people  are in your household. They will quote your out-of-pocket cost based on that information.  [employer] will also be able to set up a 58-month, interest-free payment plan if needed.  Applying the monitor   Shave hair from upper left chest.  Hold abrader disc by orange tab. Rub abrader in 40 strokes over the upper left chest as  indicated in your monitor instructions.  Clean area with 4 enclosed alcohol pads. Let dry.  Apply patch as indicated in monitor instructions. Patch will be placed under collarbone on left  side of chest with arrow pointing upward.  Rub patch adhesive wings for 2 minutes. Remove white label marked "1". Remove the white  label  marked "2". Rub patch adhesive wings for 2 additional minutes.  While looking in a mirror, press and release button in center of patch. A small green light will  flash 3-4 times. This will be your only indicator that the monitor has been turned on.  Do not shower for the first 24 hours. You may shower after the first 24 hours.  Press the button if you feel a symptom. You will hear a small click. Record Date, Time and  Symptom in the Patient Logbook.  When you are ready to remove the patch, follow instructions on the last 2 pages of Patient  Logbook. Stick patch monitor onto the last page of Patient Logbook.  Place Patient Logbook in the blue and white box. Use locking tab on box and tape box closed  securely. The blue and white box has prepaid postage on it. Please place it in the mailbox as  soon as possible. Your physician should have your test results approximately 7 days after the  monitor has been mailed back to [facility].  Call [facility] Customer Care at [phone] if you have questions regarding  your ZIO XT patch monitor. Call them immediately if you see an orange light blinking on your  monitor.  If your monitor falls off in less than 4 days, contact our Monitor department at [phone].  If your monitor becomes loose or falls off after 4 days call [employer] at [phone] for  suggestions on securing your monitor

## 2021-06-08 NOTE — Progress Notes (Unsigned)
Patient enrolled for [employer] to mail a 14 day ZIO XT monitor to his address on file. 

## 2021-06-09 ENCOUNTER — Encounter: Payer: Self-pay | Admitting: Podiatry

## 2021-06-09 ENCOUNTER — Telehealth: Payer: Self-pay | Admitting: *Deleted

## 2021-06-09 NOTE — Telephone Encounter (Signed)
Prior Authorization for split night sleep study sent to [facility] via web portal. Tracking Number [id number].

## 2021-06-09 NOTE — Telephone Encounter (Signed)
No prolonged standing and should try to elevate at times with shoe gear modifictions

## 2021-06-09 NOTE — Telephone Encounter (Signed)
-----   Message from Alyson Ingles, LPN sent at 0/34/9611  3:41 PM EDT ----- Regarding: split night Sleep study entered [facility] done   Thank you! -M

## 2021-06-12 ENCOUNTER — Other Ambulatory Visit: Payer: Self-pay | Admitting: General Practice

## 2021-06-12 DIAGNOSIS — R400 Somnolence: Secondary | ICD-10-CM

## 2021-06-12 DIAGNOSIS — R0683 Snoring: Secondary | ICD-10-CM

## 2021-06-12 NOTE — Telephone Encounter (Signed)
Denial received from [facility] for split night sleep study. Patient has no co morbidities. HST scheduled. No PA is required. Patient notified via phone and MyChart.

## 2021-06-17 ENCOUNTER — Encounter: Payer: 59 | Admitting: Podiatry

## 2021-06-18 ENCOUNTER — Ambulatory Visit (INDEPENDENT_AMBULATORY_CARE_PROVIDER_SITE_OTHER): Payer: 59 | Admitting: Podiatry

## 2021-06-18 ENCOUNTER — Encounter: Payer: Self-pay | Admitting: Podiatry

## 2021-06-18 ENCOUNTER — Ambulatory Visit (INDEPENDENT_AMBULATORY_CARE_PROVIDER_SITE_OTHER): Payer: 59

## 2021-06-18 ENCOUNTER — Other Ambulatory Visit: Payer: Self-pay

## 2021-06-18 DIAGNOSIS — Z9889 Other specified postprocedural states: Secondary | ICD-10-CM | POA: Diagnosis not present

## 2021-06-18 NOTE — Progress Notes (Signed)
Subjective:   Patient ID: Stephen Scott, male   DOB: 60 y.o.   MRN: [id number]   HPI Patient states overall doing well but I am concerned about elevation of the second toe and hoping the swelling goes down it will go down also.  Overall I am pleased and I did lose my opinion several weeks ago   ROS      Objective:  Physical Exam  Neuro vascular status intact negative Denna Haggard' sign noted with patient's left second toe in good alignment and mildly elevated with the hallux healing well range of motion of the joint good wound edges healing well     Assessment:  Overall doing well with second digit left mildly elevated     Plan:  H&P reviewed both conditions and I have recommended immobilization and I dispensed fascial brace to lower the second toe that will help to control swelling across the ankle joint.  I then dispensed ankle compression stocking to use secondarily advised on elevation and support shoes and will be seen back to recheck again next 6 weeks  X-rays indicate osteotomy is healing very well good alignment noted everything is in good alignment

## 2021-06-24 DIAGNOSIS — R002 Palpitations: Secondary | ICD-10-CM

## 2021-06-30 ENCOUNTER — Telehealth: Payer: Self-pay | Admitting: Radiology

## 2021-06-30 ENCOUNTER — Ambulatory Visit (HOSPITAL_COMMUNITY): Payer: 59 | Attending: General Practice

## 2021-06-30 ENCOUNTER — Other Ambulatory Visit: Payer: Self-pay

## 2021-06-30 DIAGNOSIS — R0609 Other forms of dyspnea: Secondary | ICD-10-CM | POA: Diagnosis present

## 2021-06-30 DIAGNOSIS — R5383 Other fatigue: Secondary | ICD-10-CM | POA: Diagnosis not present

## 2021-06-30 LAB — ECHOCARDIOGRAM COMPLETE
Area-P 1/2: 3.5 cm2
S' Lateral: 2.4 cm

## 2021-06-30 NOTE — Telephone Encounter (Signed)
Patient was in office for his echo. I was asked to come look at his Zio monitor. It is currently blinking red. He doesn't know the exact day it started blinking but thinks he was got a full 4 days. He will take off and mail back the monitor. He is willing to redo if there wasn't enough information

## 2021-07-08 ENCOUNTER — Telehealth: Payer: Self-pay

## 2021-07-08 DIAGNOSIS — R400 Somnolence: Secondary | ICD-10-CM

## 2021-07-08 DIAGNOSIS — R0683 Snoring: Secondary | ICD-10-CM

## 2021-07-08 MED ORDER — APIXABAN 5 MG PO TABS: 5.0000 mg | ORAL_TABLET | Freq: Two times a day (BID) | ORAL | 3 refills | Status: DC

## 2021-07-08 MED ORDER — METOPROLOL SUCCINATE ER 25 MG PO TB24: ORAL_TABLET | ORAL | 3 refills | Status: DC

## 2021-07-08 NOTE — Telephone Encounter (Signed)
Spoke to patient monitor results given.Advised to stop aspirin.Start Eliquis 5 mg twice a day.Start Toprol XL 25 mg daily.Dr.Jordan advised ok to have upcoming spinal surgery.You will have to hold Eliquis 3 days prior.Advised to have surgeon fax a surgical clearance.Dr.Jordan recommended a sleep study.Scheduler will call back with appointment.Advised to keep appointment as planned with Edd Fabian PA 11/21 at 8:45 am.

## 2021-07-08 NOTE — Telephone Encounter (Signed)
Clinical pharmacist to review Eliquis.  Patient was started on Eliquis today after recent heart monitor showed 25% A. fib burden.

## 2021-07-08 NOTE — Telephone Encounter (Signed)
Patient with diagnosis of afib on Eliquis (started today) for anticoagulation.    Procedure: L3-4 lumbar laminectomy Date of procedure: TBD  CHA2DS2-VASc Score = 2  This indicates a 2.2% annual risk of stroke. The patient's score is based upon: CHF History: 0 HTN History: 1 Diabetes History: 0 Stroke History: 0 Vascular Disease History: 1 Age Score: 0 Gender Score: 0   CrCl >169mL/min Platelet count 216K  Per office protocol, patient can hold Eliquis for 3 days prior to procedure.

## 2021-07-08 NOTE — Telephone Encounter (Signed)
   [facility] Pre-operative Risk Assessment    Patient Name: Stephen Scott  DOB: [birth year] MRN: [id number]  HEARTCARE STAFF:  - IMPORTANT!!!!!! Under Visit Info/Reason for Call, type in Other and utilize the format Clearance MM/DD/YY or Clearance TBD. Do not use dashes or single digits. - Please review there is not already an duplicate clearance open for this procedure. - If request is for dental extraction, please clarify the # of teeth to be extracted. - If the patient is currently at the dentist's office, call Pre-Op Callback Staff (MA/nurse) to input urgent request.  - If the patient is not currently in the dentist office, please route to the Pre-Op pool.  Request for surgical clearance:  What type of surgery is being performed? L3-4 Lumbar Laminectomy  When is this surgery scheduled? TBD  What type of clearance is required (medical clearance vs. Pharmacy clearance to hold med vs. Both)? Both  Are there any medications that need to be held prior to surgery and how long? Eliquis  Practice name and name of physician performing surgery? [facility]; Elaina Hoops  What is the office phone number? [phone]   7.   What is the office fax number? [phone]   8.   Anesthesia type (None, local, MAC, general) ? General   Orvan July 07/08/2021, 10:56 AM  _________________________________________________________________   (provider comments below)

## 2021-07-09 NOTE — Telephone Encounter (Signed)
   Primary Cardiologist: Peter Swaziland, MD  Chart reviewed as part of pre-operative protocol coverage. Given past medical history and time since last visit, based on ACC/AHA guidelines, Stephen Scott would be at acceptable risk for the planned procedure without further cardiovascular testing.   Patient with diagnosis of afib on Eliquis (started today) for anticoagulation.     Procedure: L3-4 lumbar laminectomy Date of procedure: TBD   CHA2DS2-VASc Score = 2  This indicates a 2.2% annual risk of stroke. The patient's score is based upon: CHF History: 0 HTN History: 1 Diabetes History: 0 Stroke History: 0 Vascular Disease History: 1 Age Score: 0 Gender Score: 0    CrCl >126mL/min Platelet count 216K   Per office protocol, patient can hold Eliquis for 3 days prior to procedure.   I will route this recommendation to the requesting party via Epic fax function and remove from pre-op pool.  Please call with questions.  Thomasene Ripple. Octavian Godek NP-C    07/09/2021, 1:48 PM [facility] [address] Office [phone] Fax [phone]

## 2021-07-13 ENCOUNTER — Ambulatory Visit (HOSPITAL_BASED_OUTPATIENT_CLINIC_OR_DEPARTMENT_OTHER): Payer: 59 | Admitting: Cardiovascular Disease

## 2021-07-24 ENCOUNTER — Ambulatory Visit (INDEPENDENT_AMBULATORY_CARE_PROVIDER_SITE_OTHER): Payer: 59 | Admitting: Podiatry

## 2021-07-24 ENCOUNTER — Ambulatory Visit (INDEPENDENT_AMBULATORY_CARE_PROVIDER_SITE_OTHER): Payer: 59

## 2021-07-24 ENCOUNTER — Other Ambulatory Visit: Payer: Self-pay

## 2021-07-24 ENCOUNTER — Encounter: Payer: Self-pay | Admitting: Podiatry

## 2021-07-24 DIAGNOSIS — T84498D Other mechanical complication of other internal orthopedic devices, implants and grafts, subsequent encounter: Secondary | ICD-10-CM | POA: Diagnosis not present

## 2021-07-24 DIAGNOSIS — Z09 Encounter for follow-up examination after completed treatment for conditions other than malignant neoplasm: Secondary | ICD-10-CM

## 2021-07-24 DIAGNOSIS — Z9889 Other specified postprocedural states: Secondary | ICD-10-CM | POA: Diagnosis not present

## 2021-07-24 NOTE — Progress Notes (Signed)
Subjective:   Patient ID: Stephen Scott, male   DOB: 60 y.o.   MRN: [id number]   HPI Patient presents stating he is doing well but still getting quite a bit of swelling and he is due to have back surgery soon neuro   ROS      Objective:  Physical Exam  Vascular status intact negative Denna Haggard' sign noted wound edges are coapted well there is moderate edema in the forefoot the patient has been active and attempting to wear dress shoes at times     Assessment:  Overall doing well but does have moderate edema in his foot     Plan:  H&P discussed edema continue with elevation compression and keeping the second toe down with excellent range of motion first MPJ.  Gradual increase in activities and I would like to check him back again in the next 6 weeks depending on the status with his back surgery  X-rays indicate that there is good healing osteotomy second toe slightly elevated but overall good position

## 2021-07-29 ENCOUNTER — Other Ambulatory Visit: Payer: Self-pay | Admitting: Neurosurgery

## 2021-07-30 ENCOUNTER — Other Ambulatory Visit: Payer: Self-pay

## 2021-07-30 ENCOUNTER — Ambulatory Visit (HOSPITAL_BASED_OUTPATIENT_CLINIC_OR_DEPARTMENT_OTHER): Payer: 59 | Attending: General Practice | Admitting: Cardiovascular Disease

## 2021-07-30 DIAGNOSIS — R0683 Snoring: Secondary | ICD-10-CM

## 2021-07-30 DIAGNOSIS — R400 Somnolence: Secondary | ICD-10-CM

## 2021-08-14 NOTE — Progress Notes (Signed)
Surgical Instructions    Your procedure is scheduled on 08/24/21.  Report to [facility] Main Entrance "A" at 9:30 A.M., then check in with the Admitting office.  Call this number if you have problems the morning of surgery:  [phone]   If you have any questions prior to your surgery date call [phone]: Open Monday-Friday 8am-4pm    Remember:  Do not eat or drink after midnight the night before your surgery      Take these medicines the morning of surgery with A SIP OF WATER  amLODipine (NORVASC)  atorvastatin (LIPITOR) metoprolol succinate (TOPROL XL)  venlafaxine XR (EFFEXOR-XR)  IF NEEDED: acetaminophen (TYLENOL)  gabapentin (NEURONTIN) loratadine (CLARITIN)  ondansetron (ZOFRAN) oxyCODONE-acetaminophen (PERCOCET) Polyethyl Glycol-Propyl Glycol (SYSTANE OP)  As of today, STOP taking any Aspirin (unless otherwise instructed by your surgeon) Aleve, Naproxen, Ibuprofen, Motrin, Advil, Goody's, BC's, all herbal medications, fish oil, and all vitamins.  Please hold apixaban (ELIQUIS) for 3 days prior to surgery. Your last dose will be 08/20/21.    After your COVID test   You are not required to quarantine however you are required to wear a well-fitting mask when you are out and around people not in your household.  If your mask becomes wet or soiled, replace with a new one.  Wash your hands often with soap and water for 20 seconds or clean your hands with an alcohol-based hand sanitizer that contains at least 60% alcohol.  Do not share personal items.  Notify your provider: if you are in close contact with someone who has COVID  or if you develop a fever of 100.4 or greater, sneezing, cough, sore throat, shortness of breath or body aches.             Do not wear jewelry or makeup Do not wear lotions, powders, perfumes/colognes, or deodorant. Men may shave face and neck. Do not bring valuables to the hospital. DO Not wear nail polish, gel polish, artificial  nails, or any other type of covering on natural nails including finger and toenails. If patients have artificial nails, gel coating, etc. that need to be removed by a nail salon, please have this removed prior to surgery or surgery may need to be canceled/delayed if the surgeon/ anesthesia feels like the patient is unable to be adequately monitored.             [facility] is not responsible for any belongings or valuables.  Do NOT Smoke (Tobacco/Vaping)  24 hours prior to your procedure  If you use a CPAP at night, you may bring your mask for your overnight stay.   Contacts, glasses, hearing aids, dentures or partials may not be worn into surgery, please bring cases for these belongings   For patients admitted to the hospital, discharge time will be determined by your treatment team.   Patients discharged the day of surgery will not be allowed to drive home, and someone needs to stay with them for 24 hours.  NO VISITORS WILL BE ALLOWED IN PRE-OP WHERE PATIENTS ARE PREPPED FOR SURGERY.  ONLY 1 SUPPORT PERSON MAY BE PRESENT IN THE WAITING ROOM WHILE YOU ARE IN SURGERY.  IF YOU ARE TO BE ADMITTED, ONCE YOU ARE IN YOUR ROOM YOU WILL BE ALLOWED TWO (2) VISITORS. 1 (ONE) VISITOR MAY STAY OVERNIGHT BUT MUST ARRIVE TO THE ROOM BY 8pm.  Minor children may have two parents present. Special consideration for safety and communication needs will be reviewed on a case by case  basis.  Special instructions:    Oral Hygiene is also important to reduce your risk of infection.  Remember - BRUSH YOUR TEETH THE MORNING OF SURGERY WITH YOUR REGULAR TOOTHPASTE   - Preparing For Surgery  Before surgery, you can play an important role. Because skin is not sterile, your skin needs to be as free of germs as possible. You can reduce the number of germs on your skin by washing with CHG (chlorahexidine gluconate) Soap before surgery.  CHG is an antiseptic cleaner which kills germs and bonds with the skin to  continue killing germs even after washing.     Please do not use if you have an allergy to CHG or antibacterial soaps. If your skin becomes reddened/irritated stop using the CHG.  Do not shave (including legs and underarms) for at least 48 hours prior to first CHG shower. It is OK to shave your face.  Please follow these instructions carefully.     Shower the NIGHT BEFORE SURGERY and the MORNING OF SURGERY with CHG Soap.   If you chose to wash your hair, wash your hair first as usual with your normal shampoo. After you shampoo, rinse your hair and body thoroughly to remove the shampoo.  Then [employer] and genitals (private parts) with your normal soap and rinse thoroughly to remove soap.  After that Use CHG Soap as you would any other liquid soap. You can apply CHG directly to the skin and wash gently with a scrungie or a clean washcloth.   Apply the CHG Soap to your body ONLY FROM THE NECK DOWN.  Do not use on open wounds or open sores. Avoid contact with your eyes, ears, mouth and genitals (private parts). Wash Face and genitals (private parts)  with your normal soap.   Wash thoroughly, paying special attention to the area where your surgery will be performed.  Thoroughly rinse your body with warm water from the neck down.  DO NOT shower/wash with your normal soap after using and rinsing off the CHG Soap.  Pat yourself dry with a CLEAN TOWEL.  Wear CLEAN PAJAMAS to bed the night before surgery  Place CLEAN SHEETS on your bed the night before your surgery  DO NOT SLEEP WITH PETS.   Day of Surgery: Take a shower with CHG soap. Wear Clean/Comfortable clothing the morning of surgery Do not apply any deodorants/lotions.   Remember to brush your teeth WITH YOUR REGULAR TOOTHPASTE.   Please read over the following fact sheets that you were given.

## 2021-08-15 NOTE — Progress Notes (Signed)
Cardiology Clinic Note   Patient Name: Isabella Leathem Date of Encounter: 08/17/2021  Primary Care Provider:  Caren Macadam, MD Primary Cardiologist:  Peter Martinique, MD  Patient Profile    Abduallah Doolittle 60 year old male presents the clinic today for follow-up evaluation of his coronary artery disease and hyperlipidemia.  Also, evaluation for preoperative cardiac evaluation.  Past Medical History    Past Medical History:  Diagnosis Date   Anemia    as a small child   Anxiety    Arthritis    "lower spine; knees" (11/01/2017)   Chronic lower back pain    Depression    Family history of adverse reaction to anesthesia    "daughter PONV"   GERD (gastroesophageal reflux disease)    High cholesterol    History of kidney stones    Hypertension    Pneumonia 06/2017; 08/2017   walking pneumonia; treated   Spinal stenosis    "lower back" (11/01/2017)   Past Surgical History:  Procedure Laterality Date   CORONARY STENT INTERVENTION N/A 03/04/2020   Procedure: CORONARY STENT INTERVENTION;  Surgeon: Martinique, Peter M, MD;  Location: [facility];  Service: Cardiovascular;  Laterality: N/A;   HAND RECONSTRUCTION Left 1989   "injured when washing dishes"   INTRAVASCULAR PRESSURE WIRE/FFR STUDY N/A 03/04/2020   Procedure: INTRAVASCULAR PRESSURE WIRE/FFR STUDY;  Surgeon: Martinique, Peter M, MD;  Location: [facility];  Service: Cardiovascular;  Laterality: N/A;   INTRAVASCULAR ULTRASOUND/IVUS N/A 03/04/2020   Procedure: Intravascular Ultrasound/IVUS;  Surgeon: Martinique, Peter M, MD;  Location: [facility];  Service: Cardiovascular;  Laterality: N/A;   JOINT REPLACEMENT     KNEE ARTHROSCOPY Right 2008   LEFT HEART CATH AND CORONARY ANGIOGRAPHY N/A 03/04/2020   Procedure: LEFT HEART CATH AND CORONARY ANGIOGRAPHY;  Surgeon: Martinique, Peter M, MD;  Location: [facility];  Service: Cardiovascular;  Laterality: N/A;   TOTAL KNEE ARTHROPLASTY Right 11/01/2017   TOTAL KNEE ARTHROPLASTY  Right 11/01/2017   Procedure: RIGHT TOTAL KNEE ARTHROPLASTY;  Surgeon: Mcarthur Rossetti, MD;  Location: [facility];  Service: Orthopedics;  Laterality: Right;   WISDOM TOOTH EXTRACTION  1981    Allergies  Allergies  Allergen Reactions   Iodides Anaphylaxis, Hives and Other (See Comments)    Immediate reaction, looks like chicken pox   Tetanus Toxoids Other (See Comments)    Swelling and tenderness in arm > Local reaction. Older version of tetanus shot, tolerates the newer version     History of Present Illness    Ashtian Bohning has a PMH of unstable angina, essential hypertension, HLD, coronary artery disease, chronic low back pain, chronic right knee pain, and internal fixator device.  He presented on 6/21 with unstable angina.  He underwent cardiac catheterization which showed severe circumflex disease.  He underwent successful PCI with DES.  He lost around 40 pounds with dietary changes.  He was going to cardiac rehab and felt well.   He reported that his stress level since December have been elevated.  He noted that his blood pressure had increased.  He had also not been eating well and had gained around 20 pounds back.  He has been eating more salty foods.  He indicated that during prior week on a stressful day his blood pressure was 170/100.  He felt bad with his increased blood pressure.  He reported headache and left temporal pain.  He felt flushed.  He also noted some burning in his throat and pains.  He denied jaw pain which was his previous anginal equivalent.  He had not gone to cardiac rehab in a month.  Dr. Swaziland placed him on chlorthalidone 12.5 mg daily.  He was encouraged to make dietary and lifestyle modifications.   On follow-up after having chlorthalidone increased to 25 mg daily as blood pressure was 117-1 120/60-70.  He continued to try to eat better.  He reported that he was exercising again with walking and stationary biking.  He continued to have increased stress at work  and with his personal life.  He reported seeing a Veterinary surgeon.  His previous symptoms have improved.  He denied sublingual nitroglycerin use.  He did note one occasion of jaw pain.   He presented to the clinic 06/08/2021 for follow-up evaluation stated he had noticed increased fatigue and palpitations over the last 2 weeks.  He reported that he had increased stress at work due to employee concentrations.  He underwent left foot surgery 4 weeks ago.  He had gained weight due to his inability to stay physically active related to his left foot and his back/sciatica pain.  We reviewed his previous angiography and anginal equivalents.  He did report increased caffeine and chocolate intake which she felt was contributing to his palpitations.  He presented with his wife who reported that he would come home from work sleep for 2 hours, snore and then was not able to sleep well at night.  I  ordered a sleep study, cardiac event monitor, echocardiogram, and asked him to continue to increase his physical activity as tolerated.  His Epworth sleep score is 7.  We will plan follow-up for 10 to 12 weeks.  He reports that he has been working with his PCP to help with medications for his depression.  His echocardiogram showed an EF of 60-65%, G1 DD, mildly dilated left atria and no significant valvular abnormalities.  His cardiac event monitor showed sinus rhythm and paroxysmal atrial fibrillation.  He was started on metoprolol and apixaban.   He presents to the clinic today for follow-up evaluation states he notices occasional episodes of breakthrough palpitations.  He does report that with his metoprolol he has noticed much less frequent palpitations.  He reports compliance with his apixaban and denies bleeding issues.  We reviewed his echocardiogram and cardiac event monitor results.  He expressed understanding.  We reviewed his CHA2DS2-VASc score and stroke risk.  I will have him take an extra half dose of metoprolol for  extended periods of palpitations, avoid triggers, increase physical activity as tolerated, and follow-up in 6 months.  Today he denies chest pain, shortness of breath, lower extremity edema, melena, hematuria, hemoptysis, diaphoresis, weakness, presyncope, syncope, orthopnea, and PND.  Home Medications    Prior to Admission medications   Medication Sig Start Date End Date Taking? Authorizing Provider  acetaminophen (TYLENOL) 500 MG tablet Take 500-1,000 mg by mouth every 6 (six) hours as needed for moderate pain.    [provider]  amLODipine (NORVASC) 10 MG tablet Take 10 mg by mouth daily.    [provider]  apixaban (ELIQUIS) 5 MG TABS tablet Take 1 tablet (5 mg total) by mouth 2 (two) times daily. 07/08/21   Swazil[place], Peter M, MD  atorvastatin (LIPITOR) 80 MG tablet TAKE 1 TABLET(80 MG) BY MOUTH DAILY 11/25/20   [place]azil[place], Peter M, MD  chlorthalidone (HYGROTON) 25 MG tablet Take 1 tablet (25 mg total) by mouth daily. 12/01/20 11/26/21  [place]azil[place], Peter M, MD  gabapentin (NEURONTIN)  300 MG capsule Take 300 mg by mouth daily as needed (pain). 06/05/21   [provider]  loratadine (CLARITIN) 10 MG tablet Take 10 mg by mouth daily as needed for allergies.     [provider]  losartan (COZAAR) 100 MG tablet Take 100 mg by mouth daily.    [provider]  metoprolol succinate (TOPROL XL) 25 MG 24 hr tablet Take 25 mg every morning 07/08/21   Martinique, Peter M, MD  nitroGLYCERIN (NITROSTAT) 0.4 MG SL tablet Place 1 tablet (0.4 mg total) under the tongue every 5 (five) minutes as needed for chest pain. 11/14/20 08/12/21  Martinique, Peter M, MD  ondansetron (ZOFRAN) 4 MG tablet Take 1 tablet (4 mg total) by mouth every 8 (eight) hours as needed for nausea or vomiting. 05/11/21   Regal, Tamala Fothergill, DPM  oxyCODONE-acetaminophen (PERCOCET) 10-325 MG tablet Take 1 tablet by mouth every 4 (four) hours as needed for pain. 05/11/21   Wallene Huh, DPM  Polyethyl Glycol-Propyl Glycol  (SYSTANE OP) Place 1 drop into both eyes daily as needed (dry eyes).    [provider]  venlafaxine XR (EFFEXOR-XR) 150 MG 24 hr capsule Take 150 mg by mouth daily with breakfast.    [provider]    Family History    Family History  Problem Relation Age of Onset   Clotting disorder Mother    Pulmonary embolism Mother    Diabetes Mother    Hyperlipidemia Mother    Hypertension Mother    Heart disease Brother        stent   Heart attack Brother    He indicated that his mother is deceased. He indicated that his father is deceased. He indicated that only one of his two brothers is alive.  Social History    Social History   Socioeconomic History   Marital status: Married    Spouse name: Not on file   Number of children: 2   Years of education: Not on file   Highest education level: Not on file  Occupational History    Employer: [employer]  Tobacco Use   Smoking status: Never   Smokeless tobacco: Never  Vaping Use   Vaping Use: Never used  Substance and Sexual Activity   Alcohol use: Yes    Alcohol/week: 3.0 standard drinks    Types: 3 Glasses of wine per week   Drug use: No   Sexual activity: Not on file  Other Topics Concern   Not on file  Social History Narrative   Not on file   Social Determinants of Health   Financial Resource Strain: Not on file  Food Insecurity: Not on file  Transportation Needs: Not on file  Physical Activity: Not on file  Stress: Not on file  Social Connections: Not on file  Intimate Partner Violence: Not on file     Review of Systems    General:  No chills, fever, night sweats or weight changes.  Cardiovascular:  No chest pain, dyspnea on exertion, edema, orthopnea, palpitations, paroxysmal nocturnal dyspnea. Dermatological: No rash, lesions/masses Respiratory: No cough, dyspnea Urologic: No hematuria, dysuria Abdominal:   No nausea, vomiting, diarrhea, bright red blood per rectum, melena, or  hematemesis Neurologic:  No visual changes, wkns, changes in mental status. All other systems reviewed and are otherwise negative except as noted above.  Physical Exam    VS:  BP 114/64   Pulse 69   Ht 5\' 11"  (1.803  m)   Wt 272 lb (123.4 kg)   BMI 37.94 kg/m  , BMI Body mass index is 37.94 kg/m. GEN: Well nourished, well developed, in no acute distress. HEENT: normal. Neck: Supple, no JVD, carotid bruits, or masses. Cardiac: RRR, no murmurs, rubs, or gallops. No clubbing, cyanosis, edema.  Radials/DP/PT 2+ and equal bilaterally.  Respiratory:  Respirations regular and unlabored, clear to auscultation bilaterally. GI: Soft, nontender, nondistended, BS + x 4. MS: no deformity or atrophy. Skin: warm and dry, no rash. Neuro:  Strength and sensation are intact. Psych: Normal affect.  Accessory Clinical Findings    Recent Labs: 11/14/2020: ALT 33; Hemoglobin 14.2; Platelets 216 12/01/2020: BUN 15; Creatinine, Ser 0.74; Potassium 3.7; Sodium 139   Recent Lipid Panel    Component Value Date/Time   CHOL 174 11/14/2020 1513   TRIG 113 11/14/2020 1513   HDL 52 11/14/2020 1513   CHOLHDL 3.3 11/14/2020 1513   LDLCALC 102 (H) 11/14/2020 1513    ECG personally reviewed by me today-normal sinus rhythm 69 bpm no ST or T wave deviation.  Cardiac catheterization 03/04/2020   Prox Cx to Mid Cx lesion is 80% stenosed. Post intervention, there is a 0% residual stenosis. A drug-eluting stent was successfully placed using a STENT RESOLUTE ONYX 3.5X22. The left ventricular systolic function is normal. LV end diastolic pressure is normal. The left ventricular ejection fraction is 55-65% by visual estimate.   1. Single vessel obstructive CAD involving the LCx. 2. Normal LV function 3. Normal LVEDP 4. Successful PCI of the LCx using FFR and IVUS guidance with DES x 1.   Plan: anticipate same day DC. DAPT with ASA and Brilinta x 1 year.    Diagnostic Dominance: Right Intervention      Echocardiogram 06/30/2021 IMPRESSIONS     1. Left ventricular ejection fraction, by estimation, is 60 to 65%. The  left ventricle has normal function. The left ventricle has no regional  wall motion abnormalities. Left ventricular diastolic parameters are  consistent with Grade I diastolic  dysfunction (impaired relaxation).   2. Right ventricular systolic function is normal. The right ventricular  size is normal. Tricuspid regurgitation signal is inadequate for assessing  PA pressure.   3. Left atrial size was mildly dilated.   4. The mitral valve is normal in structure. No evidence of mitral valve  regurgitation. No evidence of mitral stenosis.   5. The aortic valve is normal in structure. Aortic valve regurgitation is  not visualized. No aortic stenosis is present.   6. The inferior vena cava is normal in size with greater than 50%  respiratory variability, suggesting right atrial pressure of 3 mmHg.   Comparison(s): No prior Echocardiogram.   Cardiac event monitor 07/07/2021 Normal sinus rhythm Paroxysmal Atrial fibrillation. burden of 25%. rate 51-191 with average of 98 bpm. one episode of aberrancy.     Patch Wear Time:  5 days and 8 hours (2022-09-28T10:09:29-0400 to 2022-10-03T18:51:11-398)   Patient had a min HR of 40 bpm, max HR of 197 bpm, and avg HR of 88 bpm. Predominant underlying rhythm was Sinus Rhythm. 1 run of Ventricular Tachycardia occurred lasting 7 beats with a max rate of 197 bpm (avg 174 bpm). Atrial Fibrillation occurred (25%  burden), ranging from 51-191 bpm (avg of 98 bpm), the longest lasting 1 hour 38 mins with an avg rate of 98 bpm. Isolated SVEs were rare (<1.0%, 1307), SVE Couplets were rare (<1.0%, 28), and SVE Triplets were rare (<1.0%, 5). Isolated VEs were  rare  (<1.0%, 860), VE Couplets were rare (<1.0%, 1), and VE Triplets were rare (<1.0%, 1). Ventricular Bigeminy and Trigeminy were present.   Assessment & Plan   1.  Paroxysmal atrial  fibrillation-continues to notice intermittent periods of palpitations occasionally.  Cardiac event monitor showed 25% A. fib burden and paroxysmal atrial fibrillation along with normal sinus rhythm.  Details above.  Underwent sleep study.  He was contacted and told that he had a period that was not recorded.  He is planning to reschedule the study. Continue metoprolol, apixaban May take an extra half dose of metoprolol for extended periods of palpitations lasting greater than 15-20 minutes, once daily. Heart healthy low-sodium diet Maintain physical activity Avoid triggers caffeine, chocolate, EtOH, dehydration etc.  Coronary artery disease-reports sensation of palpitations and throat/neck sensation.  Describes the sensation of atrial fibrillation like taking off on amusement park ride.  Symptoms are made better with rest and with addition of metoprolol.  It appears that some of his symptoms may be related to deconditioning and increased weight.  Status post cardiac catheterization with DES to circumflex 6/21.  Anginal equivalent was jaw pain. Continue aspirin, atorvastatin, amlodipine, nitroglycerin Heart healthy low-sodium diet-salty 6 given Increase physical activity as tolerated   Essential hypertension-BP today 114/64.  Well-controlled at home. Continue chlorthalidone, losartan, amlodipine Heart healthy low-sodium diet-salty 6 given Increase physical activity as tolerated   Hyperlipidemia-11/14/2020: Cholesterol, Total 174; HDL 52; LDL Chol Calc (NIH) 102; Triglycerides 113 Continue atorvastatin, aspirin Heart healthy low-sodium high-fiber diet.   Increase physical activity as tolerated   Obesity-weight today 272pounds.  Previously lost around 40 pounds which showed an improvement in his blood pressure.  Then had an increase of 20 pounds in the setting of poor diet and less physical activity. Continue heart healthy low-sodium diet Increase physical activity as tolerated-goal 150 minutes  of moderate physical activity per week.  Preoperative cardiac evaluation-laminectomy, Dr. Kary Kos, 08/24/2021  Primary Cardiologist: Peter Martinique, MD   Chart reviewed as part of pre-operative protocol coverage. Given past medical history and time since last visit, based on ACC/AHA guidelines, Jhonatan Taing would be at acceptable risk for the planned procedure without further cardiovascular testing.    Patient with diagnosis of afib on Eliquis (started today) for anticoagulation.     Procedure: L3-4 lumbar laminectomy Date of procedure: TBD   CHA2DS2-VASc Score = 2  This indicates a 2.2% annual risk of stroke. The patient's score is based upon: CHF History: 0 HTN History: 1 Diabetes History: 0 Stroke History: 0 Vascular Disease History: 1 Age Score: 0 Gender Score: 0    CrCl >142mL/min Platelet count 216K   Per office protocol, patient can hold Eliquis for 3 days prior to procedure.    I will route this recommendation to the requesting party via Epic fax function and remove from pre-op pool.  Disposition: Follow-up with Dr. Martinique after sleep study.  Jossie Ng. Karrina Lye NP-C    08/17/2021, 9:11 AM [place] [address] Office [phone] Fax [phone]  Notice: This dictation was prepared with Dragon dictation along with smaller phrase technology. Any transcriptional errors that result from this process are unintentional and may not be corrected upon review.  I spent 14 minutes examining this patient, reviewing medications, and using patient centered shared decision making involving her cardiac care.  Prior to her visit I spent greater than 20 minutes reviewing her past medical history,  medications, and prior cardiac tests.

## 2021-08-17 ENCOUNTER — Ambulatory Visit (INDEPENDENT_AMBULATORY_CARE_PROVIDER_SITE_OTHER): Payer: 59 | Admitting: General Practice

## 2021-08-17 ENCOUNTER — Encounter: Payer: Self-pay | Admitting: General Practice

## 2021-08-17 ENCOUNTER — Encounter (HOSPITAL_COMMUNITY): Payer: Self-pay

## 2021-08-17 ENCOUNTER — Encounter (HOSPITAL_COMMUNITY)
Admission: RE | Admit: 2021-08-17 | Discharge: 2021-08-17 | Disposition: A | Discharge status: Home / Self Care | Payer: 59 | Source: Ambulatory Visit | Attending: Neurosurgery | Admitting: Neurosurgery

## 2021-08-17 ENCOUNTER — Other Ambulatory Visit: Payer: Self-pay

## 2021-08-17 VITALS — BP 114/64 | HR 69 | Ht 71.0 in | Wt 272.0 lb

## 2021-08-17 VITALS — BP 127/80 | HR 70 | Temp 97.9°F | Resp 18 | Ht 71.0 in | Wt 271.3 lb

## 2021-08-17 DIAGNOSIS — I2510 Atherosclerotic heart disease of native coronary artery without angina pectoris: Secondary | ICD-10-CM | POA: Diagnosis not present

## 2021-08-17 DIAGNOSIS — Z01818 Encounter for other preprocedural examination: Secondary | ICD-10-CM

## 2021-08-17 DIAGNOSIS — Z01812 Encounter for preprocedural laboratory examination: Secondary | ICD-10-CM | POA: Insufficient documentation

## 2021-08-17 DIAGNOSIS — Z01810 Encounter for preprocedural cardiovascular examination: Secondary | ICD-10-CM | POA: Diagnosis not present

## 2021-08-17 DIAGNOSIS — I10 Essential (primary) hypertension: Secondary | ICD-10-CM | POA: Diagnosis not present

## 2021-08-17 DIAGNOSIS — Z9861 Coronary angioplasty status: Secondary | ICD-10-CM

## 2021-08-17 DIAGNOSIS — I480 Paroxysmal atrial fibrillation: Secondary | ICD-10-CM

## 2021-08-17 DIAGNOSIS — E785 Hyperlipidemia, unspecified: Secondary | ICD-10-CM

## 2021-08-17 HISTORY — DX: Headache, unspecified: R51.9

## 2021-08-17 LAB — BASIC METABOLIC PANEL
Anion gap: 11 (ref 5–15)
BUN: 25 mg/dL — ABNORMAL HIGH (ref 8–23)
CO2: 25 mmol/L (ref 22–32)
Calcium: 9 mg/dL (ref 8.9–10.3)
Chloride: 103 mmol/L (ref 98–111)
Creatinine, Ser: 0.88 mg/dL (ref 0.61–1.24)
GFR, Estimated: >60 mL/min (ref >60)
Glucose, Bld: 128 mg/dL — ABNORMAL HIGH (ref 70–99)
Potassium: 3.9 mmol/L (ref 3.5–5.1)
Sodium: 139 mmol/L (ref 135–145)

## 2021-08-17 LAB — CBC
HCT: 45.4 % (ref 39.0–52.0)
Hemoglobin: 14.7 g/dL (ref 13.0–17.0)
MCH: 29.3 pg (ref 26.0–34.0)
MCHC: 32.4 g/dL (ref 30.0–36.0)
MCV: 90.6 fL (ref 80.0–100.0)
Platelets: 203 10*3/uL (ref 150–400)
RBC: 5.01 MIL/uL (ref 4.22–5.81)
RDW: 13.6 % (ref 11.5–15.5)
WBC: 8.2 10*3/uL (ref 4.0–10.5)
nRBC: 0 % (ref 0.0–0.2)

## 2021-08-17 LAB — GLUCOSE, CAPILLARY: Glucose-Capillary: 147 mg/dL — ABNORMAL HIGH (ref 70–99)

## 2021-08-17 LAB — SURGICAL PCR SCREEN
MRSA, PCR: NEGATIVE
Staphylococcus aureus: NEGATIVE

## 2021-08-17 MED ORDER — METOPROLOL SUCCINATE ER 25 MG PO TB24: ORAL_TABLET | ORAL | 3 refills | Status: DC

## 2021-08-17 MED ORDER — APIXABAN 5 MG PO TABS: 5.0000 mg | ORAL_TABLET | Freq: Two times a day (BID) | ORAL | 3 refills | Status: DC

## 2021-08-17 NOTE — Patient Instructions (Addendum)
Medication Instructions:  Stop taking eliquis for the 3 days prior to your procedure.  Continue with metoprolol succinate 25 mg every morning. Take metoprolol succinate 12.5 mg (one-half tablet) if you have breakthrough palpitations that last longer than 15-20 minutes. Do this only one time per day as needed.  *If you need a refill on your cardiac medications before your next appointment, please call your pharmacy*   Lab Work: None   Testing/Procedures: None    Follow-Up: At [facility], you and your health needs are our priority.  As part of our continuing mission to provide you with exceptional heart care, we have created designated Provider Care Teams.  These Care Teams include your primary Cardiologist (physician) and Advanced Practice Providers (APPs -  Physician Assistants and Nurse Practitioners) who all work together to provide you with the care you need, when you need it.  We recommend signing up for the patient portal called "MyChart".  Sign up information is provided on this After Visit Summary.  MyChart is used to connect with patients for Virtual Visits (Telemedicine).  Patients are able to view lab/test results, encounter notes, upcoming appointments, etc.  Non-urgent messages can be sent to your provider as well.   To learn more about what you can do with MyChart, go to [email or web address].    Your next appointment:   6 month(s)  The format for your next appointment:   In Person  Provider:   Edd Fabian, FNP       Other Instructions Remember to repeat your sleep study  Avoid triggers of atrial fibrillation (avoid caffeine, chocolate, alcohol, dehydration)

## 2021-08-17 NOTE — Progress Notes (Signed)
PCP - Aliene Beams Cardiologist - Peter Swaziland Received cardiac clearance 08/17/21  Chest x-ray - n/a EKG - 06/08/21 ECHO - 06/30/21 Cardiac Cath - 03/04/20  Sleep Study - Currently set up to take a sleep study  Blood Thinner Instructions: Last dose of Eliquis 08/20/21   ERAS Protcol - yes, no drink ordered or given   COVID TEST- Appointment made for 08/21/21 (outpatient in bed)   Anesthesia review: yes, heart history Received cardiac clearance on 08/17/21 (see note from Edd Fabian, NP)  Patient denies shortness of breath, fever, cough and chest pain at PAT appointment   All instructions explained to the patient, with a verbal understanding of the material. Patient agrees to go over the instructions while at home for a better understanding. Patient also instructed to self quarantine after being tested for COVID-19. The opportunity to ask questions was provided.

## 2021-08-17 NOTE — Progress Notes (Signed)
Surgical Instructions    Your procedure is scheduled on Monday, November 28th.  Report to [facility] Main Entrance "A" at 9:30 A.M., then check in with the Admitting office.  Call this number if you have problems the morning of surgery:  [phone]   If you have any questions prior to your surgery date call [phone]: Open Monday-Friday 8am-4pm    Remember:  Do not eat after midnight the night before your surgery  You may drink clear liquids until 8:30 the morning of your surgery.   Clear liquids allowed are: Water, Non-Citrus Juices (without pulp), Carbonated Beverages, Clear Tea, Black Coffee ONLY (NO MILK, CREAM OR POWDERED CREAMER of any kind), and Gatorade     Take these medicines the morning of surgery with A SIP OF WATER  amLODipine (NORVASC)  atorvastatin (LIPITOR) metoprolol succinate (TOPROL XL)  venlafaxine XR (EFFEXOR-XR)  IF NEEDED: acetaminophen (TYLENOL)  gabapentin (NEURONTIN) loratadine (CLARITIN)  ondansetron (ZOFRAN) oxyCODONE-acetaminophen (PERCOCET) Polyethyl Glycol-Propyl Glycol (SYSTANE OP) eye drops  As of today, STOP taking any Aspirin (unless otherwise instructed by your surgeon) Aleve, Naproxen, Ibuprofen, Motrin, Advil, Goody's, BC's, all herbal medications, fish oil, and all vitamins.  Please hold apixaban (ELIQUIS) for 3 days prior to surgery. Your last dose will be 08/20/21.    DAY OF SURGERY:         Do not wear jewelry  Do not wear lotions, powders, colognes, or deodorant. Men may shave face and neck. Do not bring valuables to the hospital.             [facility] is not responsible for any belongings or valuables.  Do NOT Smoke (Tobacco/Vaping)  24 hours prior to your procedure  If you use a CPAP at night, you may bring your mask for your overnight stay.   Contacts, glasses, hearing aids, dentures or partials may not be worn into surgery, please bring cases for these belongings   For patients admitted to the hospital,  discharge time will be determined by your treatment team.   Patients discharged the day of surgery will not be allowed to drive home, and someone needs to stay with them for 24 hours.  NO VISITORS WILL BE ALLOWED IN PRE-OP WHERE PATIENTS ARE PREPPED FOR SURGERY.  ONLY 1 SUPPORT PERSON MAY BE PRESENT IN THE WAITING ROOM WHILE YOU ARE IN SURGERY.  IF YOU ARE TO BE ADMITTED, ONCE YOU ARE IN YOUR ROOM YOU WILL BE ALLOWED TWO (2) VISITORS. 1 (ONE) VISITOR MAY STAY OVERNIGHT BUT MUST ARRIVE TO THE ROOM BY 8pm.  Minor children may have two parents present. Special consideration for safety and communication needs will be reviewed on a case by case basis.  Special instructions:    Oral Hygiene is also important to reduce your risk of infection.  Remember - BRUSH YOUR TEETH THE MORNING OF SURGERY WITH YOUR REGULAR TOOTHPASTE   [place]- Preparing For Surgery  Before surgery, you can play an important role. Because skin is not sterile, your skin needs to be as free of germs as possible. You can reduce the number of germs on your skin by washing with CHG (chlorahexidine gluconate) Soap before surgery.  CHG is an antiseptic cleaner which kills germs and bonds with the skin to continue killing germs even after washing.     Please do not use if you have an allergy to CHG or antibacterial soaps. If your skin becomes reddened/irritated stop using the CHG.  Do not shave (including legs and underarms) for  at least 48 hours prior to first CHG shower. It is OK to shave your face.  Please follow these instructions carefully.     Shower the NIGHT BEFORE SURGERY and the MORNING OF SURGERY with CHG Soap.   If you chose to wash your hair, wash your hair first as usual with your normal shampoo. After you shampoo, rinse your hair and body thoroughly to remove the shampoo.  Then [employer] and genitals (private parts) with your normal soap and rinse thoroughly to remove soap.  After that Use CHG Soap as you would any  other liquid soap. You can apply CHG directly to the skin and wash gently with a scrungie or a clean washcloth.   Apply the CHG Soap to your body ONLY FROM THE NECK DOWN.  Do not use on open wounds or open sores. Avoid contact with your eyes, ears, mouth and genitals (private parts). Wash Face and genitals (private parts)  with your normal soap.   Wash thoroughly, paying special attention to the area where your surgery will be performed.  Thoroughly rinse your body with warm water from the neck down.  DO NOT shower/wash with your normal soap after using and rinsing off the CHG Soap.  Pat yourself dry with a CLEAN TOWEL.  Wear CLEAN PAJAMAS to bed the night before surgery  Place CLEAN SHEETS on your bed the night before your surgery  DO NOT SLEEP WITH PETS.   Day of Surgery: Take a shower with CHG soap. Wear Clean/Comfortable clothing the morning of surgery Do not apply any deodorants/lotions.   Remember to brush your teeth WITH YOUR REGULAR TOOTHPASTE.   Please read over the following fact sheets that you were given.

## 2021-08-18 NOTE — Anesthesia Preprocedure Evaluation (Addendum)
Anesthesia Evaluation  Patient identified by MRN, date of birth, ID band Patient awake    Reviewed: Allergy & Precautions, NPO status , Patient's Chart, lab work & pertinent test results, reviewed documented beta blocker date and time   Airway Mallampati: II  TM Distance: >3 FB Neck ROM: Full    Dental  (+) Dental Advisory Given, Chipped, Missing,    Pulmonary sleep apnea ,    Pulmonary exam normal breath sounds clear to auscultation       Cardiovascular hypertension, Pt. on medications and Pt. on home beta blockers (-) angina+ CAD and + Cardiac Stents  Normal cardiovascular exam+ dysrhythmias Atrial Fibrillation  Rhythm:Regular Rate:Normal     Neuro/Psych  Headaches, PSYCHIATRIC DISORDERS Anxiety Depression Spinal stenosis   Neuromuscular disease    GI/Hepatic Neg liver ROS, GERD  Medicated,  Endo/Other  Obesity   Renal/GU negative Renal ROS     Musculoskeletal  (+) Arthritis ,   Abdominal   Peds  Hematology  (+) Blood dyscrasia (Eliquis), ,   Anesthesia Other Findings Day of surgery medications reviewed with the patient.  Reproductive/Obstetrics                            Anesthesia Physical Anesthesia Plan  ASA: 3  Anesthesia Plan: General   Post-op Pain Management:    Induction: Intravenous  PONV Risk Score and Plan: 3 and Midazolam, Ondansetron and Dexamethasone  Airway Management Planned: Oral ETT  Additional Equipment:   Intra-op Plan:   Post-operative Plan: Extubation in OR  Informed Consent: I have reviewed the patients History and Physical, chart, labs and discussed the procedure including the risks, benefits and alternatives for the proposed anesthesia with the patient or authorized representative who has indicated his/her understanding and acceptance.     Dental advisory given  Plan Discussed with: CRNA  Anesthesia Plan Comments: (PAT note by Karoline Caldwell,  PA-C: Follows with cardiology for history of HTN, HLD, CAD. He presented June 2021 with unstable angina. He underwent cardiac catheterization which showed severe circumflex disease. He underwent successful PCI with DES.  More recently he was evaluated for fatigue and palpitations.  Echo 06/30/2021 showed an EF of 60-65%, G1 DD, mildly dilated left atria and no significant valvular abnormalities.  Event monitor 06/08/2021 showed sinus rhythm with paroxysmal atrial fibrillation.  He was started on metoprolol and apixaban.  He was last seen 08/17/2021 for preop evaluation.  He was cleared for surgery at that time and advised to hold Eliquis 3 days prior to procedure.  Concern for OSA.  Recent sleep study had issue with data recording.  Repeat study being planned.  Preop labs reviewed, unremarkable.   EKG 06/08/21: NSR. Rate 64.  Event monitor 06/08/2021: .Normal sinus rhythm .Paroxysmal Atrial fibrillation. burden of 25%. rate 51-191 with average of 98 bpm. one episode of aberrancy.  TTE 06/30/21: 1. Left ventricular ejection fraction, by estimation, is 60 to 65%. The  left ventricle has normal function. The left ventricle has no regional  wall motion abnormalities. Left ventricular diastolic parameters are  consistent with Grade I diastolic  dysfunction (impaired relaxation).  2. Right ventricular systolic function is normal. The right ventricular  size is normal. Tricuspid regurgitation signal is inadequate for assessing  PA pressure.  3. Left atrial size was mildly dilated.  4. The mitral valve is normal in structure. No evidence of mitral valve  regurgitation. No evidence of mitral stenosis.  5. The aortic valve is  normal in structure. Aortic valve regurgitation is  not visualized. No aortic stenosis is present.  6. The inferior vena cava is normal in size with greater than 50%  respiratory variability, suggesting right atrial pressure of 3 mmHg.   Cath and PCI 03/04/20: 1. Single  vessel obstructive CAD involving the LCx. 2. Normal LV function 3. Normal LVEDP 4. Successful PCI of the LCx using FFR and IVUS guidance with DES x 1.  Plan: anticipate same day DC. DAPT with ASA and Brilinta x 1 year.    )       Anesthesia Quick Evaluation

## 2021-08-18 NOTE — Progress Notes (Signed)
Anesthesia Chart Review:  Follows with cardiology for history of HTN, HLD, CAD.  He presented June 2021 with unstable angina.  He underwent cardiac catheterization which showed severe circumflex disease.  He underwent successful PCI with DES.  More recently he was evaluated for fatigue and palpitations.  Echo 06/30/2021 showed an EF of 60-65%, G1 DD, mildly dilated left atria and no significant valvular abnormalities.  Event monitor 06/08/2021 showed sinus rhythm with paroxysmal atrial fibrillation.  He was started on metoprolol and apixaban.  He was last seen 08/17/2021 for preop evaluation.  He was cleared for surgery at that time and advised to hold Eliquis 3 days prior to procedure.  Concern for OSA.  Recent sleep study had issue with data recording.  Repeat study being planned.  Preop labs reviewed, unremarkable.   EKG 06/08/21: NSR. Rate 64.  Event monitor 06/08/2021: Normal sinus rhythm Paroxysmal Atrial fibrillation. burden of 25%. rate 51-191 with average of 98 bpm. one episode of aberrancy.  TTE 06/30/21:  1. Left ventricular ejection fraction, by estimation, is 60 to 65%. The  left ventricle has normal function. The left ventricle has no regional  wall motion abnormalities. Left ventricular diastolic parameters are  consistent with Grade I diastolic  dysfunction (impaired relaxation).   2. Right ventricular systolic function is normal. The right ventricular  size is normal. Tricuspid regurgitation signal is inadequate for assessing  PA pressure.   3. Left atrial size was mildly dilated.   4. The mitral valve is normal in structure. No evidence of mitral valve  regurgitation. No evidence of mitral stenosis.   5. The aortic valve is normal in structure. Aortic valve regurgitation is  not visualized. No aortic stenosis is present.   6. The inferior vena cava is normal in size with greater than 50%  respiratory variability, suggesting right atrial pressure of 3 mmHg.   Cath and PCI  03/04/20: 1. Single vessel obstructive CAD involving the LCx. 2. Normal LV function 3. Normal LVEDP 4. Successful PCI of the LCx using FFR and IVUS guidance with DES x 1.   Plan: anticipate same day DC. DAPT with ASA and Brilinta x 1 year.      Zannie Cove [facility] Short Stay Center/Anesthesiology Phone [phone] 08/18/2021 10:51 AM

## 2021-08-21 ENCOUNTER — Other Ambulatory Visit (HOSPITAL_COMMUNITY): Admission: RE | Admit: 2021-08-21 | Payer: 59 | Source: Ambulatory Visit

## 2021-08-21 NOTE — Progress Notes (Signed)
Pt missed pre-op covid test. Called pt and he is unable to make it in today for testing. Pt will need to be tested DOS. Pt told to arrive at 11am.   Viviano Simas, RN

## 2021-08-24 ENCOUNTER — Other Ambulatory Visit: Payer: Self-pay

## 2021-08-24 ENCOUNTER — Ambulatory Visit (HOSPITAL_COMMUNITY)
Admission: RE | Disposition: A | Discharge status: Home / Self Care | Payer: Self-pay | Source: Home / Self Care | Attending: Neurosurgery

## 2021-08-24 ENCOUNTER — Ambulatory Visit (HOSPITAL_COMMUNITY): Payer: 59

## 2021-08-24 ENCOUNTER — Encounter (HOSPITAL_COMMUNITY): Payer: Self-pay | Admitting: Neurosurgery

## 2021-08-24 ENCOUNTER — Ambulatory Visit (HOSPITAL_COMMUNITY): Payer: 59 | Admitting: Physician Assistant

## 2021-08-24 ENCOUNTER — Ambulatory Visit (HOSPITAL_COMMUNITY): Payer: 59 | Admitting: Certified Registered Nurse Anesthetist

## 2021-08-24 ENCOUNTER — Ambulatory Visit (HOSPITAL_COMMUNITY)
Admission: RE | Admit: 2021-08-24 | Discharge: 2021-08-25 | Disposition: A | Discharge status: Home / Self Care | Payer: 59 | Attending: Neurosurgery | Admitting: Neurosurgery

## 2021-08-24 ENCOUNTER — Other Ambulatory Visit: Payer: Self-pay | Admitting: Neurosurgery

## 2021-08-24 DIAGNOSIS — Z419 Encounter for procedure for purposes other than remedying health state, unspecified: Secondary | ICD-10-CM

## 2021-08-24 DIAGNOSIS — M48061 Spinal stenosis, lumbar region without neurogenic claudication: Secondary | ICD-10-CM | POA: Diagnosis present

## 2021-08-24 DIAGNOSIS — R519 Headache, unspecified: Secondary | ICD-10-CM | POA: Diagnosis not present

## 2021-08-24 DIAGNOSIS — I4891 Unspecified atrial fibrillation: Secondary | ICD-10-CM | POA: Insufficient documentation

## 2021-08-24 DIAGNOSIS — D759 Disease of blood and blood-forming organs, unspecified: Secondary | ICD-10-CM | POA: Diagnosis not present

## 2021-08-24 DIAGNOSIS — M1990 Unspecified osteoarthritis, unspecified site: Secondary | ICD-10-CM | POA: Diagnosis not present

## 2021-08-24 DIAGNOSIS — G709 Myoneural disorder, unspecified: Secondary | ICD-10-CM | POA: Insufficient documentation

## 2021-08-24 DIAGNOSIS — M5416 Radiculopathy, lumbar region: Secondary | ICD-10-CM | POA: Insufficient documentation

## 2021-08-24 DIAGNOSIS — I10 Essential (primary) hypertension: Secondary | ICD-10-CM | POA: Insufficient documentation

## 2021-08-24 DIAGNOSIS — Z955 Presence of coronary angioplasty implant and graft: Secondary | ICD-10-CM | POA: Insufficient documentation

## 2021-08-24 DIAGNOSIS — F32A Depression, unspecified: Secondary | ICD-10-CM | POA: Diagnosis not present

## 2021-08-24 DIAGNOSIS — F419 Anxiety disorder, unspecified: Secondary | ICD-10-CM | POA: Diagnosis not present

## 2021-08-24 DIAGNOSIS — K219 Gastro-esophageal reflux disease without esophagitis: Secondary | ICD-10-CM | POA: Diagnosis not present

## 2021-08-24 DIAGNOSIS — Z20822 Contact with and (suspected) exposure to covid-19: Secondary | ICD-10-CM | POA: Insufficient documentation

## 2021-08-24 DIAGNOSIS — I2510 Atherosclerotic heart disease of native coronary artery without angina pectoris: Secondary | ICD-10-CM | POA: Insufficient documentation

## 2021-08-24 DIAGNOSIS — M48062 Spinal stenosis, lumbar region with neurogenic claudication: Secondary | ICD-10-CM | POA: Diagnosis present

## 2021-08-24 DIAGNOSIS — Z7901 Long term (current) use of anticoagulants: Secondary | ICD-10-CM | POA: Insufficient documentation

## 2021-08-24 DIAGNOSIS — G4730 Sleep apnea, unspecified: Secondary | ICD-10-CM | POA: Insufficient documentation

## 2021-08-24 HISTORY — PX: LUMBAR LAMINECTOMY/DECOMPRESSION MICRODISCECTOMY: SHX5026

## 2021-08-24 LAB — SARS CORONAVIRUS 2 BY RT PCR (HOSPITAL ORDER, PERFORMED IN ~~LOC~~ HOSPITAL LAB): SARS Coronavirus 2: NEGATIVE

## 2021-08-24 SURGERY — LUMBAR LAMINECTOMY/DECOMPRESSION MICRODISCECTOMY 1 LEVEL
Anesthesia: General | Site: Back | Laterality: Bilateral

## 2021-08-24 MED ORDER — SUGAMMADEX SODIUM 500 MG/5ML IV SOLN
INTRAVENOUS | Status: AC
  Filled 2021-08-24: qty 5

## 2021-08-24 MED ORDER — ACETAMINOPHEN 500 MG PO TABS: 500.0000 mg | ORAL_TABLET | Freq: Four times a day (QID) | ORAL | Status: DC | PRN

## 2021-08-24 MED ORDER — ORAL CARE MOUTH RINSE: 15.0000 mL | Freq: Once | OROMUCOSAL | Status: AC

## 2021-08-24 MED ORDER — PANTOPRAZOLE SODIUM 40 MG PO TBEC
40.0000 mg | DELAYED_RELEASE_TABLET | Freq: Every day | ORAL | Status: DC
  Administered 2021-08-24 – 2021-08-25 (×2): 40 mg via ORAL
  Filled 2021-08-24 (×2): qty 1

## 2021-08-24 MED ORDER — ROCURONIUM BROMIDE 10 MG/ML (PF) SYRINGE
PREFILLED_SYRINGE | INTRAVENOUS | Status: AC
  Filled 2021-08-24: qty 10

## 2021-08-24 MED ORDER — PANTOPRAZOLE SODIUM 40 MG IV SOLR: 40.0000 mg | Freq: Every day | INTRAVENOUS | Status: DC

## 2021-08-24 MED ORDER — ONDANSETRON HCL 4 MG/2ML IJ SOLN
INTRAMUSCULAR | Status: DC | PRN
  Administered 2021-08-24: 4 mg via INTRAVENOUS

## 2021-08-24 MED ORDER — LOSARTAN POTASSIUM 50 MG PO TABS
100.0000 mg | ORAL_TABLET | Freq: Every day | ORAL | Status: DC
  Administered 2021-08-25: 100 mg via ORAL
  Filled 2021-08-24: qty 2

## 2021-08-24 MED ORDER — CEFAZOLIN SODIUM-DEXTROSE 2-4 GM/100ML-% IV SOLN
2.0000 g | INTRAVENOUS | Status: AC
  Administered 2021-08-24: 17:00:00 2 g via INTRAVENOUS

## 2021-08-24 MED ORDER — EPHEDRINE SULFATE-NACL 50-0.9 MG/10ML-% IV SOSY
PREFILLED_SYRINGE | INTRAVENOUS | Status: DC | PRN
  Administered 2021-08-24 (×3): 5 mg via INTRAVENOUS

## 2021-08-24 MED ORDER — LACTATED RINGERS IV SOLN: INTRAVENOUS | Status: DC

## 2021-08-24 MED ORDER — DEXAMETHASONE SODIUM PHOSPHATE 10 MG/ML IJ SOLN
INTRAMUSCULAR | Status: AC
  Filled 2021-08-24: qty 1

## 2021-08-24 MED ORDER — THROMBIN (RECOMBINANT) 5000 UNITS EX SOLR
CUTANEOUS | Status: AC
  Filled 2021-08-24: qty 5000

## 2021-08-24 MED ORDER — CHLORHEXIDINE GLUCONATE CLOTH 2 % EX PADS: 6.0000 | MEDICATED_PAD | Freq: Once | CUTANEOUS | Status: DC

## 2021-08-24 MED ORDER — GABAPENTIN 300 MG PO CAPS: 300.0000 mg | ORAL_CAPSULE | Freq: Every day | ORAL | Status: DC | PRN

## 2021-08-24 MED ORDER — ROCURONIUM BROMIDE 10 MG/ML (PF) SYRINGE
PREFILLED_SYRINGE | INTRAVENOUS | Status: DC | PRN
  Administered 2021-08-24: 70 mg via INTRAVENOUS

## 2021-08-24 MED ORDER — LIDOCAINE 2% (20 MG/ML) 5 ML SYRINGE
INTRAMUSCULAR | Status: AC
  Filled 2021-08-24: qty 5

## 2021-08-24 MED ORDER — EPINEPHRINE 1 MG/10ML IJ SOSY
PREFILLED_SYRINGE | INTRAMUSCULAR | Status: AC
  Filled 2021-08-24: qty 10

## 2021-08-24 MED ORDER — ACETAMINOPHEN 325 MG PO TABS
650.0000 mg | ORAL_TABLET | ORAL | Status: DC | PRN
  Administered 2021-08-24 – 2021-08-25 (×2): 650 mg via ORAL
  Filled 2021-08-24 (×2): qty 2

## 2021-08-24 MED ORDER — ACETAMINOPHEN 500 MG PO TABS: 1000.0000 mg | ORAL_TABLET | Freq: Once | ORAL | Status: AC

## 2021-08-24 MED ORDER — CEFAZOLIN SODIUM-DEXTROSE 2-4 GM/100ML-% IV SOLN
INTRAVENOUS | Status: AC
  Filled 2021-08-24: qty 100

## 2021-08-24 MED ORDER — CHLORHEXIDINE GLUCONATE 0.12 % MT SOLN: 15.0000 mL | Freq: Once | OROMUCOSAL | Status: AC

## 2021-08-24 MED ORDER — CYCLOBENZAPRINE HCL 10 MG PO TABS
10.0000 mg | ORAL_TABLET | Freq: Three times a day (TID) | ORAL | Status: DC | PRN
  Administered 2021-08-24 – 2021-08-25 (×2): 10 mg via ORAL
  Filled 2021-08-24 (×2): qty 1

## 2021-08-24 MED ORDER — SODIUM CHLORIDE 0.9% FLUSH: 3.0000 mL | Freq: Two times a day (BID) | INTRAVENOUS | Status: DC

## 2021-08-24 MED ORDER — HYDROMORPHONE HCL 1 MG/ML IJ SOLN: 0.5000 mg | INTRAMUSCULAR | Status: DC | PRN

## 2021-08-24 MED ORDER — FENTANYL CITRATE (PF) 250 MCG/5ML IJ SOLN
INTRAMUSCULAR | Status: AC
  Filled 2021-08-24: qty 5

## 2021-08-24 MED ORDER — LIDOCAINE-EPINEPHRINE 1 %-1:100000 IJ SOLN
INTRAMUSCULAR | Status: AC
  Filled 2021-08-24: qty 1

## 2021-08-24 MED ORDER — 0.9 % SODIUM CHLORIDE (POUR BTL) OPTIME
TOPICAL | Status: DC | PRN
  Administered 2021-08-24: 17:00:00 1000 mL

## 2021-08-24 MED ORDER — PHENOL 1.4 % MT LIQD: 1.0000 | OROMUCOSAL | Status: DC | PRN

## 2021-08-24 MED ORDER — CHLORHEXIDINE GLUCONATE 0.12 % MT SOLN
OROMUCOSAL | Status: AC
  Administered 2021-08-24: 12:00:00 15 mL via OROMUCOSAL
  Filled 2021-08-24: qty 15

## 2021-08-24 MED ORDER — THROMBIN 5000 UNITS EX SOLR
CUTANEOUS | Status: DC | PRN
  Administered 2021-08-24: 17:00:00 10000 [IU] via TOPICAL

## 2021-08-24 MED ORDER — ONDANSETRON HCL 4 MG/2ML IJ SOLN: 4.0000 mg | Freq: Four times a day (QID) | INTRAMUSCULAR | Status: DC | PRN

## 2021-08-24 MED ORDER — ACETAMINOPHEN 500 MG PO TABS
ORAL_TABLET | ORAL | Status: AC
  Administered 2021-08-24: 12:00:00 1000 mg via ORAL
  Filled 2021-08-24: qty 2

## 2021-08-24 MED ORDER — LACTATED RINGERS IV SOLN: INTRAVENOUS | Status: DC | PRN

## 2021-08-24 MED ORDER — BUPIVACAINE HCL (PF) 0.25 % IJ SOLN
INTRAMUSCULAR | Status: AC
  Filled 2021-08-24: qty 30

## 2021-08-24 MED ORDER — LIDOCAINE 2% (20 MG/ML) 5 ML SYRINGE
INTRAMUSCULAR | Status: DC | PRN
  Administered 2021-08-24: 100 mg via INTRAVENOUS

## 2021-08-24 MED ORDER — FENTANYL CITRATE (PF) 100 MCG/2ML IJ SOLN
25.0000 ug | INTRAMUSCULAR | Status: DC | PRN
  Administered 2021-08-24: 19:00:00 50 ug via INTRAVENOUS

## 2021-08-24 MED ORDER — PROPOFOL 10 MG/ML IV BOLUS
INTRAVENOUS | Status: AC
  Filled 2021-08-24: qty 20

## 2021-08-24 MED ORDER — AMLODIPINE BESYLATE 5 MG PO TABS
10.0000 mg | ORAL_TABLET | Freq: Every day | ORAL | Status: DC
  Administered 2021-08-25: 10 mg via ORAL
  Filled 2021-08-24: qty 2

## 2021-08-24 MED ORDER — OXYCODONE HCL 5 MG PO TABS
10.0000 mg | ORAL_TABLET | ORAL | Status: DC | PRN
  Administered 2021-08-24 – 2021-08-25 (×5): 10 mg via ORAL
  Filled 2021-08-24 (×5): qty 2

## 2021-08-24 MED ORDER — KETAMINE HCL 10 MG/ML IJ SOLN
INTRAMUSCULAR | Status: DC | PRN
  Administered 2021-08-24: 20 mg via INTRAVENOUS

## 2021-08-24 MED ORDER — SODIUM CHLORIDE 0.9 % IV SOLN: 250.0000 mL | INTRAVENOUS | Status: DC

## 2021-08-24 MED ORDER — MIDAZOLAM HCL 5 MG/5ML IJ SOLN
INTRAMUSCULAR | Status: DC | PRN
  Administered 2021-08-24 (×2): 1 mg via INTRAVENOUS

## 2021-08-24 MED ORDER — PROMETHAZINE HCL 25 MG/ML IJ SOLN: 6.2500 mg | INTRAMUSCULAR | Status: DC | PRN

## 2021-08-24 MED ORDER — PHENYLEPHRINE HCL-NACL 20-0.9 MG/250ML-% IV SOLN
INTRAVENOUS | Status: DC | PRN
  Administered 2021-08-24: 40 ug/min via INTRAVENOUS

## 2021-08-24 MED ORDER — DEXAMETHASONE SODIUM PHOSPHATE 10 MG/ML IJ SOLN
INTRAMUSCULAR | Status: DC | PRN
  Administered 2021-08-24: 10 mg via INTRAVENOUS

## 2021-08-24 MED ORDER — VENLAFAXINE HCL ER 75 MG PO CP24
150.0000 mg | ORAL_CAPSULE | Freq: Every day | ORAL | Status: DC
  Administered 2021-08-25: 150 mg via ORAL
  Filled 2021-08-24: qty 2

## 2021-08-24 MED ORDER — ATORVASTATIN CALCIUM 80 MG PO TABS
80.0000 mg | ORAL_TABLET | Freq: Every day | ORAL | Status: DC
  Administered 2021-08-25: 80 mg via ORAL
  Filled 2021-08-24: qty 1

## 2021-08-24 MED ORDER — HEMOSTATIC AGENTS (NO CHARGE) OPTIME
TOPICAL | Status: DC | PRN
  Administered 2021-08-24: 1 via TOPICAL

## 2021-08-24 MED ORDER — SUGAMMADEX SODIUM 500 MG/5ML IV SOLN
INTRAVENOUS | Status: DC | PRN
  Administered 2021-08-24: 200 mg via INTRAVENOUS

## 2021-08-24 MED ORDER — CEFAZOLIN SODIUM-DEXTROSE 2-4 GM/100ML-% IV SOLN
2.0000 g | Freq: Three times a day (TID) | INTRAVENOUS | Status: AC
  Administered 2021-08-24 – 2021-08-25 (×2): 2 g via INTRAVENOUS
  Filled 2021-08-24 (×2): qty 100

## 2021-08-24 MED ORDER — PHENYLEPHRINE 40 MCG/ML (10ML) SYRINGE FOR IV PUSH (FOR BLOOD PRESSURE SUPPORT)
PREFILLED_SYRINGE | INTRAVENOUS | Status: DC | PRN
  Administered 2021-08-24 (×2): 40 ug via INTRAVENOUS

## 2021-08-24 MED ORDER — CHLORTHALIDONE 25 MG PO TABS
25.0000 mg | ORAL_TABLET | Freq: Every day | ORAL | Status: DC
  Administered 2021-08-25: 25 mg via ORAL
  Filled 2021-08-24: qty 1

## 2021-08-24 MED ORDER — FENTANYL CITRATE (PF) 100 MCG/2ML IJ SOLN
INTRAMUSCULAR | Status: AC
  Filled 2021-08-24: qty 2

## 2021-08-24 MED ORDER — SODIUM CHLORIDE 0.9% FLUSH: 3.0000 mL | INTRAVENOUS | Status: DC | PRN

## 2021-08-24 MED ORDER — MIDAZOLAM HCL 2 MG/2ML IJ SOLN
INTRAMUSCULAR | Status: AC
  Filled 2021-08-24: qty 2

## 2021-08-24 MED ORDER — FENTANYL CITRATE (PF) 250 MCG/5ML IJ SOLN
INTRAMUSCULAR | Status: DC | PRN
  Administered 2021-08-24: 100 ug via INTRAVENOUS
  Administered 2021-08-24: 50 ug via INTRAVENOUS

## 2021-08-24 MED ORDER — LORATADINE 10 MG PO TABS: 10.0000 mg | ORAL_TABLET | Freq: Every day | ORAL | Status: DC | PRN

## 2021-08-24 MED ORDER — ONDANSETRON HCL 4 MG PO TABS: 4.0000 mg | ORAL_TABLET | Freq: Four times a day (QID) | ORAL | Status: DC | PRN

## 2021-08-24 MED ORDER — METOPROLOL SUCCINATE ER 25 MG PO TB24: 12.5000 mg | ORAL_TABLET | Freq: Every day | ORAL | Status: DC | PRN

## 2021-08-24 MED ORDER — METOPROLOL SUCCINATE ER 25 MG PO TB24
25.0000 mg | ORAL_TABLET | Freq: Every day | ORAL | Status: DC
  Administered 2021-08-25: 25 mg via ORAL
  Filled 2021-08-24: qty 1

## 2021-08-24 MED ORDER — ALUM & MAG HYDROXIDE-SIMETH 200-200-20 MG/5ML PO SUSP: 30.0000 mL | Freq: Four times a day (QID) | ORAL | Status: DC | PRN

## 2021-08-24 MED ORDER — SUCCINYLCHOLINE CHLORIDE 200 MG/10ML IV SOSY
PREFILLED_SYRINGE | INTRAVENOUS | Status: AC
  Filled 2021-08-24: qty 10

## 2021-08-24 MED ORDER — ONDANSETRON HCL 4 MG/2ML IJ SOLN
INTRAMUSCULAR | Status: AC
  Filled 2021-08-24: qty 2

## 2021-08-24 MED ORDER — MENTHOL 3 MG MT LOZG: 1.0000 | LOZENGE | OROMUCOSAL | Status: DC | PRN

## 2021-08-24 MED ORDER — KETAMINE HCL 50 MG/5ML IJ SOSY
PREFILLED_SYRINGE | INTRAMUSCULAR | Status: AC
  Filled 2021-08-24: qty 5

## 2021-08-24 MED ORDER — BUPIVACAINE HCL (PF) 0.25 % IJ SOLN
INTRAMUSCULAR | Status: DC | PRN
  Administered 2021-08-24: 10 mL

## 2021-08-24 MED ORDER — ACETAMINOPHEN 650 MG RE SUPP: 650.0000 mg | RECTAL | Status: DC | PRN

## 2021-08-24 MED ORDER — PROPOFOL 10 MG/ML IV BOLUS
INTRAVENOUS | Status: DC | PRN
  Administered 2021-08-24: 130 mg via INTRAVENOUS

## 2021-08-24 MED ORDER — POLYETHYL GLYCOL-PROPYL GLYCOL 0.4-0.3 % OP GEL
Freq: Every day | OPHTHALMIC | Status: DC | PRN
  Filled 2021-08-24: qty 10

## 2021-08-24 MED ORDER — LIDOCAINE-EPINEPHRINE 1 %-1:100000 IJ SOLN
INTRAMUSCULAR | Status: DC | PRN
  Administered 2021-08-24: 10 mL

## 2021-08-24 SURGICAL SUPPLY — 53 items
BAG COUNTER SPONGE SURGICOUNT (BAG) ×2 IMPLANT
BAG SURGICOUNT SPONGE COUNTING (BAG) ×1
BAND RUBBER #18 3X1/16 STRL (MISCELLANEOUS) ×6 IMPLANT
BENZOIN TINCTURE PRP APPL 2/3 (GAUZE/BANDAGES/DRESSINGS) ×3 IMPLANT
BLADE SURG 11 STRL SS (BLADE) ×3 IMPLANT
BUR CUTTER 7.0 ROUND (BURR) ×3 IMPLANT
BUR MATCHSTICK NEURO 3.0 LAGG (BURR) ×3 IMPLANT
CANISTER SUCT 3000ML PPV (MISCELLANEOUS) ×3 IMPLANT
CARTRIDGE OIL MAESTRO DRILL (MISCELLANEOUS) ×1 IMPLANT
CLOSURE WOUND 1/2 X4 (GAUZE/BANDAGES/DRESSINGS) ×1
DERMABOND ADVANCED (GAUZE/BANDAGES/DRESSINGS) ×2
DERMABOND ADVANCED .7 DNX12 (GAUZE/BANDAGES/DRESSINGS) ×1 IMPLANT
DIFFUSER DRILL AIR PNEUMATIC (MISCELLANEOUS) ×3 IMPLANT
DRAPE HALF SHEET 40X57 (DRAPES) ×6 IMPLANT
DRAPE LAPAROTOMY 100X72X124 (DRAPES) ×3 IMPLANT
DRAPE MICROSCOPE LEICA (MISCELLANEOUS) ×3 IMPLANT
DRAPE SURG 17X23 STRL (DRAPES) ×3 IMPLANT
DRSG OPSITE 4X5.5 SM (GAUZE/BANDAGES/DRESSINGS) ×3 IMPLANT
DRSG OPSITE POSTOP 4X6 (GAUZE/BANDAGES/DRESSINGS) ×3 IMPLANT
DURAPREP 26ML APPLICATOR (WOUND CARE) ×3 IMPLANT
ELECT BLADE 4.0 EZ CLEAN MEGAD (MISCELLANEOUS) ×3
ELECT REM PT RETURN 9FT ADLT (ELECTROSURGICAL) ×3
ELECTRODE BLDE 4.0 EZ CLN MEGD (MISCELLANEOUS) ×1 IMPLANT
ELECTRODE REM PT RTRN 9FT ADLT (ELECTROSURGICAL) ×1 IMPLANT
EVACUATOR 1/8 PVC DRAIN (DRAIN) ×3 IMPLANT
GAUZE 4X4 16PLY ~~LOC~~+RFID DBL (SPONGE) IMPLANT
GAUZE SPONGE 4X4 12PLY STRL (GAUZE/BANDAGES/DRESSINGS) ×3 IMPLANT
GLOVE EXAM NITRILE XL STR (GLOVE) ×3 IMPLANT
GLOVE SURG ENC MOIS LTX SZ7 (GLOVE) ×3 IMPLANT
GLOVE SURG ENC MOIS LTX SZ8 (GLOVE) ×3 IMPLANT
GLOVE SURG UNDER LTX SZ8.5 (GLOVE) ×3 IMPLANT
GLOVE SURG UNDER POLY LF SZ7 (GLOVE) ×3 IMPLANT
GOWN STRL REUS W/ TWL LRG LVL3 (GOWN DISPOSABLE) ×1 IMPLANT
GOWN STRL REUS W/ TWL XL LVL3 (GOWN DISPOSABLE) ×2 IMPLANT
GOWN STRL REUS W/TWL 2XL LVL3 (GOWN DISPOSABLE) IMPLANT
GOWN STRL REUS W/TWL LRG LVL3 (GOWN DISPOSABLE) ×2
GOWN STRL REUS W/TWL XL LVL3 (GOWN DISPOSABLE) ×4
KIT BASIN OR (CUSTOM PROCEDURE TRAY) ×3 IMPLANT
KIT TURNOVER KIT B (KITS) ×3 IMPLANT
NEEDLE HYPO 22GX1.5 SAFETY (NEEDLE) ×3 IMPLANT
NEEDLE SPNL 22GX3.5 QUINCKE BK (NEEDLE) ×3 IMPLANT
NS IRRIG 1000ML POUR BTL (IV SOLUTION) ×3 IMPLANT
OIL CARTRIDGE MAESTRO DRILL (MISCELLANEOUS) ×3
PACK LAMINECTOMY NEURO (CUSTOM PROCEDURE TRAY) ×3 IMPLANT
SPONGE SURGIFOAM ABS GEL SZ50 (HEMOSTASIS) ×3 IMPLANT
STRIP CLOSURE SKIN 1/2X4 (GAUZE/BANDAGES/DRESSINGS) ×2 IMPLANT
SUT VIC AB 0 CT1 18XCR BRD8 (SUTURE) ×1 IMPLANT
SUT VIC AB 0 CT1 8-18 (SUTURE) ×2
SUT VIC AB 2-0 CT1 18 (SUTURE) ×3 IMPLANT
SUT VICRYL 4-0 PS2 18IN ABS (SUTURE) ×3 IMPLANT
TOWEL GREEN STERILE (TOWEL DISPOSABLE) ×3 IMPLANT
TOWEL GREEN STERILE FF (TOWEL DISPOSABLE) ×3 IMPLANT
WATER STERILE IRR 1000ML POUR (IV SOLUTION) ×3 IMPLANT

## 2021-08-24 NOTE — H&P (Signed)
Stephen Scott is an 60 y.o. male.   Chief Complaint: Back bilateral hip and leg pain neurogenic claudication HPI: 60 year old back bilateral hip and leg pain neurogenic claudication work-up revealed severe spinal stenosis with virtual complete block at L3-4.  Due to patient progression of clinical syndrome imaging findings and failed conservative treatment I recommended bilateral decompressive laminectomy at L3-4.  I have extensively gone over the risks and benefits of that operation with him as well as perioperative course expectations of outcome and alternatives to surgery and he understands and agrees to proceed forward.  Past Medical History:  Diagnosis Date   Anemia    as a small child   Anxiety    Arthritis    "lower spine; knees" (11/01/2017)   Chronic lower back pain    Depression    GERD (gastroesophageal reflux disease)    Headache    High cholesterol    History of kidney stones    Hypertension    Pneumonia 06/2017; 08/2017   walking pneumonia; treated   Spinal stenosis    "lower back" (11/01/2017)    Past Surgical History:  Procedure Laterality Date   CORONARY STENT INTERVENTION N/A 03/04/2020   Procedure: CORONARY STENT INTERVENTION;  Surgeon: Swaziland, Peter M, MD;  Location: [facility] INVASIVE CV LAB;  Service: Cardiovascular;  Laterality: N/A;   EYE SURGERY Bilateral 2018   Cataracts removed by Dr. Nile Riggs   FOOT SURGERY Left 04/2021   HAND RECONSTRUCTION Left 1989   "injured when washing dishes"   INTRAVASCULAR PRESSURE WIRE/FFR STUDY N/A 03/04/2020   Procedure: INTRAVASCULAR PRESSURE WIRE/FFR STUDY;  Surgeon: Swaziland, Peter M, MD;  Location: [facility] INVASIVE CV LAB;  Service: Cardiovascular;  Laterality: N/A;   INTRAVASCULAR ULTRASOUND/IVUS N/A 03/04/2020   Procedure: Intravascular Ultrasound/IVUS;  Surgeon: Swaziland, Peter M, MD;  Location: [facility] INVASIVE CV LAB;  Service: Cardiovascular;  Laterality: N/A;   JOINT REPLACEMENT     KNEE ARTHROSCOPY Right 2008   LEFT HEART CATH AND  CORONARY ANGIOGRAPHY N/A 03/04/2020   Procedure: LEFT HEART CATH AND CORONARY ANGIOGRAPHY;  Surgeon: Swaziland, Peter M, MD;  Location: [facility] INVASIVE CV LAB;  Service: Cardiovascular;  Laterality: N/A;   TOTAL KNEE ARTHROPLASTY Right 11/01/2017   TOTAL KNEE ARTHROPLASTY Right 11/01/2017   Procedure: RIGHT TOTAL KNEE ARTHROPLASTY;  Surgeon: Kathryne Hitch, MD;  Location: [facility] OR;  Service: Orthopedics;  Laterality: Right;   WISDOM TOOTH EXTRACTION  1981    Family History  Problem Relation Age of Onset   Clotting disorder Mother    Pulmonary embolism Mother    Diabetes Mother    Hyperlipidemia Mother    Hypertension Mother    Heart disease Brother        stent   Heart attack Brother    Social History:  reports that he has never smoked. He has never used smokeless tobacco. He reports current alcohol use of about 1.0 - 2.0 standard drink per week. He reports that he does not use drugs.  Allergies:  Allergies  Allergen Reactions   Iodides Anaphylaxis, Hives and Other (See Comments)    Immediate reaction, looks like chicken pox   Tetanus Toxoids Other (See Comments)    Swelling and tenderness in arm > Local reaction. Older version of tetanus shot, tolerates the newer version     Medications Prior to Admission  Medication Sig Dispense Refill   acetaminophen (TYLENOL) 500 MG tablet Take 500-1,000 mg by mouth every 6 (six) hours as needed for moderate pain.     amLODipine (  NORVASC) 10 MG tablet Take 10 mg by mouth daily.     atorvastatin (LIPITOR) 80 MG tablet TAKE 1 TABLET(80 MG) BY MOUTH DAILY 90 tablet 3   chlorthalidone (HYGROTON) 25 MG tablet Take 1 tablet (25 mg total) by mouth daily. 90 tablet 3   gabapentin (NEURONTIN) 300 MG capsule Take 300 mg by mouth daily as needed (pain).     loratadine (CLARITIN) 10 MG tablet Take 10 mg by mouth daily as needed for allergies.      losartan (COZAAR) 100 MG tablet Take 100 mg by mouth daily.     metoprolol succinate (TOPROL XL) 25 MG 24  hr tablet Take 25 mg every morning. If breakthrough palpitations last longer than 15-20 minutes, take 1/2 dose (one time per day). 90 tablet 3   nitroGLYCERIN (NITROSTAT) 0.4 MG SL tablet Place 1 tablet (0.4 mg total) under the tongue every 5 (five) minutes as needed for chest pain. 90 tablet 3   Polyethyl Glycol-Propyl Glycol (SYSTANE OP) Place 1 drop into both eyes daily as needed (dry eyes).     venlafaxine XR (EFFEXOR-XR) 150 MG 24 hr capsule Take 150 mg by mouth daily with breakfast.     apixaban (ELIQUIS) 5 MG TABS tablet Take 1 tablet (5 mg total) by mouth 2 (two) times daily. 180 tablet 3    Results for orders placed or performed during the hospital encounter of 08/24/21 (from the past 48 hour(s))  SARS Coronavirus 2 by RT PCR (hospital order, performed in [facility] hospital lab) Nasopharyngeal Nasopharyngeal Swab     Status: None   Collection Time: 08/24/21 11:13 AM   Specimen: Nasopharyngeal Swab  Result Value Ref Range   SARS Coronavirus 2 NEGATIVE NEGATIVE    Comment: (NOTE) SARS-CoV-2 target nucleic acids are NOT DETECTED.  The SARS-CoV-2 RNA is generally detectable in upper and lower respiratory specimens during the acute phase of infection. The lowest concentration of SARS-CoV-2 viral copies this assay can detect is 250 copies / mL. A negative result does not preclude SARS-CoV-2 infection and should not be used as the sole basis for treatment or other patient management decisions.  A negative result may occur with improper specimen collection / handling, submission of specimen other than nasopharyngeal swab, presence of viral mutation(s) within the areas targeted by this assay, and inadequate number of viral copies (<250 copies / mL). A negative result must be combined with clinical observations, patient history, and epidemiological information.  Fact Sheet for Patients:   BoilerBrush.com.cy  Fact Sheet for Healthcare  Providers: https://pope.com/  This test is not yet approved or  cleared by the [place] FDA and has been authorized for detection and/or diagnosis of SARS-CoV-2 by FDA under an Emergency Use Authorization (EUA).  This EUA will remain in effect (meaning this test can be used) for the duration of the COVID-19 declaration under Section 564(b)(1) of the Act, 21 U.S.C. section 360bbb-3(b)(1), unless the authorization is terminated or revoked sooner.  Performed at [facility], [address], [place], [place] [address]    No results found.  Review of Systems  Musculoskeletal:  Positive for back pain.  Neurological:  Positive for numbness.   Blood pressure (!) 172/62, pulse 69, temperature 98.3 F (36.8 C), resp. rate 18, height 5\' 11"  (1.803 m), weight 120.2 kg, SpO2 97 %. Physical Exam HENT:     Head: Normocephalic.     Right Ear: Tympanic membrane normal.     Nose: Nose normal.  Mouth/Throat:     Mouth: Mucous membranes are moist.  Eyes:     Pupils: Pupils are equal, round, and reactive to light.  Cardiovascular:     Rate and Rhythm: Normal rate.  Pulmonary:     Effort: Pulmonary effort is normal.  Abdominal:     General: Abdomen is flat.  Musculoskeletal:        General: Normal range of motion.  Skin:    General: Skin is warm.  Neurological:     Mental Status: He is alert.     Comments: Strength 5-5 iliopsoas, quads, hamstrings, gastrocs, into tibialis, and EHL.     Assessment/Plan 60 year old presents for bilateral decompressive laminectomy L3-4  Mariam Dollar, MD 08/24/2021, 3:49 PM

## 2021-08-24 NOTE — Transfer of Care (Addendum)
Immediate Anesthesia Transfer of Care Note  Patient: Stephen Scott  Procedure(s) Performed: LAMINECTOMY, FORAMINOTOMY L34 (Bilateral: Back)  Patient Location: PACU  Anesthesia Type:General  Level of Consciousness: awake and alert   Airway & Oxygen Therapy: Patient Spontanous Breathing  Post-op Assessment: Report given to RN and Post -op Vital signs reviewed and stable  Post vital signs: Reviewed and stable  Last Vitals:  Vitals Value Taken Time  BP 125/94 08/24/21 1836  Temp    Pulse 106 08/24/21 1839  Resp 26 08/24/21 1839  SpO2 96 % 08/24/21 1839  Vitals shown include unvalidated device data.  Last Pain: There were no vitals filed for this visit.       Complications: No notable events documented.

## 2021-08-24 NOTE — Anesthesia Postprocedure Evaluation (Signed)
Anesthesia Post Note  Patient: Stephen Scott  Procedure(s) Performed: LAMINECTOMY, FORAMINOTOMY L34 (Bilateral: Back)     Patient location during evaluation: PACU Anesthesia Type: General Level of consciousness: awake and alert, patient cooperative and oriented Pain management: pain level controlled Vital Signs Assessment: post-procedure vital signs reviewed and stable Respiratory status: spontaneous breathing, nonlabored ventilation and respiratory function stable Cardiovascular status: blood pressure returned to baseline and stable (NSR) Postop Assessment: no apparent nausea or vomiting Anesthetic complications: no   No notable events documented.  Last Vitals:  Vitals:   08/24/21 1850 08/24/21 1905  BP: 135/66 125/60  Pulse: 97 68  Resp: 20 15  Temp:    SpO2: 96% 96%    Last Pain:  Vitals:   08/24/21 1905  PainSc: 6     LLE Motor Response: Purposeful movement (08/24/21 1905) LLE Sensation: Full sensation (08/24/21 1905) RLE Motor Response: Purposeful movement (08/24/21 1905) RLE Sensation: Full sensation (08/24/21 1905)      Erling Cruz. Gina Leblond

## 2021-08-24 NOTE — Anesthesia Procedure Notes (Signed)
Procedure Name: Intubation Date/Time: 08/24/2021 4:50 PM Performed by: Janene Harvey, CRNA Pre-anesthesia Checklist: Patient identified, Emergency Drugs available, Suction available and Patient being monitored Patient Re-evaluated:Patient Re-evaluated prior to induction Oxygen Delivery Method: Circle system utilized Preoxygenation: Pre-oxygenation with 100% oxygen Induction Type: IV induction Ventilation: Oral airway inserted - appropriate to patient size and Two handed mask ventilation required Laryngoscope Size: Mac and 4 Grade View: Grade II Tube type: Oral Tube size: 7.5 mm Number of attempts: 1 Airway Equipment and Method: Stylet and Oral airway Placement Confirmation: ETT inserted through vocal cords under direct vision, positive ETCO2 and breath sounds checked- equal and bilateral Secured at: 23 cm Tube secured with: Tape Dental Injury: Teeth and Oropharynx as per pre-operative assessment

## 2021-08-24 NOTE — Op Note (Signed)
Preoperative diagnosis: Lumbar spinal stenosis bilateral L3-L4 radiculopathies with neurogenic claudication  Postoperative diagnosis: Same  Procedure: Bilateral decompressive lumbar laminectomies and foraminotomies of the L3 and L4 nerve roots with partial facetectomies at L3-4  Surgeon: Donalee Citrin  Assistant: Julien Girt  Anesthesia: General  EBL: Minimal  HPI: Patient is 60 year old gentleman with progressive worsening back pain but predominantly hip and leg pain numbness tingling his legs and feet and difficulty walking.  Work-up revealed progressive and severe spinal stenosis at L3-4 and due to patient's progression of clinical syndrome imaging findings and failed conservative treatment I recommended bilateral decompressive laminectomy at L3-4 with foraminotomies.  I extensively went over the risks and benefits of that procedure and operation with him as well as perioperative course expectations of outcome and alternatives of surgery and he understood and agreed to proceed forward.  Operative procedure: Patient brought into the OR was Duson general anesthesia positioned prone the Wilson frame.  His back was prepped and draped in routine sterile fashion.  Preoperative x-ray localized the appropriate level so after infiltration of 10 cc lidocaine with epi midline incision was made and Bovie electrocautery was used take down the subcutaneous tissue and subperiosteal dissection was carried lamina of L3 and L4 confirmed by intraoperative x-ray.  Spinous process at L3 was then removed central decompression was begun.  There was marked hourglass compression of thecal sac at L3-4 this was all teased off of the dura allowing foraminotomies of the L3 and L4 nerve root under biting the medial facet and performing partial facetectomies all decompressing central canal and both L3 and L4 nerve roots bilaterally.  At the end decompression both the thecal sac and 3 and IV nerve roots were all widely  decompressed I did inspect the disc base it was spondylitic but not significantly ruptured or herniating and secondary the fact that I performed a bilateral decompressive laminectomy elected not to cut into the disc base.  So then this was packed with Gelfoam Hemovac drain was placed Marcaine was injected in the fascia and the wound was closed in layers with interrupted Vicryl and a running 4 subcuticular.  Dermabond benzoin Steri-Strips and a sterile dressing was applied patient recovery room in stable condition.  At the end the case all needle counts and sponge counts were correct.

## 2021-08-25 ENCOUNTER — Encounter (HOSPITAL_COMMUNITY): Payer: Self-pay | Admitting: Neurosurgery

## 2021-08-25 DIAGNOSIS — M48062 Spinal stenosis, lumbar region with neurogenic claudication: Secondary | ICD-10-CM | POA: Diagnosis not present

## 2021-08-25 MED ORDER — HYDROCODONE-ACETAMINOPHEN 5-325 MG PO TABS: 1.0000 | ORAL_TABLET | ORAL | 0 refills | Status: DC | PRN

## 2021-08-25 MED ORDER — METHOCARBAMOL 500 MG PO TABS: 500.0000 mg | ORAL_TABLET | Freq: Four times a day (QID) | ORAL | 0 refills | Status: DC

## 2021-08-25 NOTE — Evaluation (Addendum)
Occupational Therapy Evaluation Patient Details Name: Stephen Scott MRN: [id number] DOB: Jan 29, 1961 Today's Date: 08/25/2021   History of Present Illness 60 y.o. male s/p bilateral decompressive lumbar laminectomies and formainectomies of L3-L4. PMH includes anxiety, depression, GERD, HTN, and R total knee arthroplasty   Clinical Impression   Pt independent at baseline with ADLs and functional mobility, lives with spouse who is available 24/7 at d/c. Currently, pt requires min guard -min A for ADLs, min A for bed mobility using log rolling technique, and min guard for transfers. Pain limiting pt's ability to perform bed mobility towards end of session, educated pt on compensatory strategies for dressing, toileting, showering, and reviewed back precautions with pt. Pt and spouse verbalized and demonstrated understanding. Pt limited by decreased activity tolerance, balance, pain, and ROM at this time, however has no acute OT needs, will s/o. Recommend safe d/c home with assistance.     Recommendations for follow up therapy are one component of a multi-disciplinary discharge planning process, led by the attending physician.  Recommendations may be updated based on patient status, additional functional criteria and insurance authorization.   Follow Up Recommendations  No OT follow up    Assistance Recommended at Discharge Intermittent Supervision/Assistance  Functional Status Assessment  Patient has had a recent decline in their functional status and demonstrates the ability to make significant improvements in function in a reasonable and predictable amount of time.  Equipment Recommendations  None recommended by OT;Other (comment) (pt has all  DME needed)    Recommendations for Other Services PT consult     Precautions / Restrictions Precautions Precautions: Back Precaution Booklet Issued: Yes (comment) Required Braces or Orthoses: Other Brace (no brace needed per  MD) Restrictions Weight Bearing Restrictions: No      Mobility Bed Mobility Overal bed mobility: Needs Assistance Bed Mobility: Sit to Sidelying;Sidelying to Sit   Sidelying to sit: Min assist     Sit to sidelying: Min assist General bed mobility comments: needed verbal and tactile cues for log rolling technique    Transfers Overall transfer level: Needs assistance Equipment used: None Transfers: Sit to/from Stand Sit to Stand: Min guard                  Balance Overall balance assessment: Mild deficits observed, not formally tested                                         ADL either performed or assessed with clinical judgement   ADL Overall ADL's : Needs assistance/impaired Eating/Feeding: Set up;Sitting   Grooming: Set up;Sitting   Upper Body Bathing: Minimal assistance;Sitting   Lower Body Bathing: Minimal assistance;Sitting/lateral leans   Upper Body Dressing : Minimal assistance;Sitting   Lower Body Dressing: Minimal assistance;Moderate assistance;Sit to/from stand   Toilet Transfer: Nature conservation officer;Ambulation   Toileting- Clothing Manipulation and Hygiene: Min guard;Sit to/from stand       Functional mobility during ADLs: Min guard;Minimal assistance       Vision   Vision Assessment?: No apparent visual deficits     Perception     Praxis      Pertinent Vitals/Pain Pain Assessment: Faces Pain Score: 3  Faces Pain Scale: Hurts little more Pain Location: Low back Pain Descriptors / Indicators: Constant;Discomfort;Grimacing Pain Intervention(s): Limited activity within patient's tolerance;Monitored during session;Repositioned     Hand Dominance     Extremity/Trunk Assessment Upper Extremity  Assessment Upper Extremity Assessment: Overall WFL for tasks assessed   Lower Extremity Assessment Lower Extremity Assessment: Defer to PT evaluation   Cervical / Trunk Assessment Cervical / Trunk Assessment: Back  Surgery   Communication Communication Communication: No difficulties   Cognition Arousal/Alertness: Awake/alert Behavior During Therapy: WFL for tasks assessed/performed Overall Cognitive Status: Within Functional Limits for tasks assessed                                 General Comments: wife notes pt is perseverative in conversation and more "chatty" when on pain meds     General Comments  wife present throughout session.    Exercises     Shoulder Instructions      Home Living Family/patient expects to be discharged to:: Private residence Living Arrangements: Spouse/significant other Available Help at Discharge: Family Type of Home: House Home Access: Stairs to enter Secretary/administrator of Steps: 5   Home Layout: Able to live on main level with bedroom/bathroom;Multi-level Alternate Level Stairs-Number of Steps: 10+   Bathroom Shower/Tub: Producer, television/film/video: Handicapped height Bathroom Accessibility: Yes   Home Equipment: Shower seat - built in;BSC/3in1          Prior Functioning/Environment Prior Level of Function : Independent/Modified Independent;Working/employed;Driving             Mobility Comments: independent at baseline ADLs Comments: independent at baseline        OT Problem List: Decreased strength;Decreased range of motion;Impaired balance (sitting and/or standing);Pain      OT Treatment/Interventions:      OT Goals(Current goals can be found in the care plan section) Acute Rehab OT Goals Patient Stated Goal: return PLOF OT Goal Formulation: With patient/family Time For Goal Achievement: 09/08/21 Potential to Achieve Goals: Good  OT Frequency:     Barriers to D/C:            Co-evaluation              AM-PAC OT "6 Clicks" Daily Activity     Outcome Measure Help from another person eating meals?: None Help from another person taking care of personal grooming?: None Help from another person  toileting, which includes using toliet, bedpan, or urinal?: A Little Help from another person bathing (including washing, rinsing, drying)?: A Little Help from another person to put on and taking off regular upper body clothing?: None Help from another person to put on and taking off regular lower body clothing?: A Little 6 Click Score: 21   End of Session Equipment Utilized During Treatment: Gait belt Nurse Communication: Mobility status  Activity Tolerance: Patient tolerated treatment well Patient left: in bed;with family/visitor present;with call bell/phone within reach;Other (comment) (MD in room)  OT Visit Diagnosis: Unsteadiness on feet (R26.81);Other abnormalities of gait and mobility (R26.89);Pain                Time: 1610-9604 OT Time Calculation (min): 42 min Charges:  OT General Charges $OT Visit: 1 Visit OT Evaluation $OT Eval Low Complexity: 1 Low OT Treatments $Self Care/Home Management : 23-37 mins  Alfonzo Beers, OTD, OTR/L Acute Rehab [phone]   Mayer Masker 08/25/2021, 9:41 AM

## 2021-08-25 NOTE — Plan of Care (Signed)
Patient alert and oriented, voiding adequately, skin clean, dry and intact without evidence of skin break down, or symptoms of complications - no redness or edema noted, only slight tenderness at site.  Patient states pain is manageable at time of discharge. Patient has an appointment with MD in 2 weeks 

## 2021-08-25 NOTE — Discharge Instructions (Signed)
Wound Care Keep the incision clean and dry remove the outer dressing in 2 days, leave the Steri-Strips intact. Wrap with Saran wrap for showers only Do not put any creams, lotions, or ointments on incision. Leave steri-strips on back.  They will fall off by themselves.  Activity Walk each and every day, increasing distance each day. No lifting greater than 5 lbs.  No lifting no bending no twisting no driving or riding a car unless coming back and forth to see me.  Diet Resume your normal diet.   Return to Work Will be discussed at you follow up appointment.  Call Your Doctor If Any of These Occur Redness, drainage, or swelling at the wound.  Temperature greater than 101 degrees. Severe pain not relieved by pain medication. Incision starts to come apart.  Follow Up Appt Call today for appointment in 1-2 weeks ([phone]) or for problems.  If you have any hardware placed in your spine, you will need an x-ray before your appointment.   

## 2021-08-25 NOTE — Evaluation (Signed)
Physical Therapy Evaluation and Discharge Patient Details Name: Stephen Scott MRN: [id number] DOB: 1961-04-29 Today's Date: 08/25/2021  History of Present Illness  60 y.o. male s/p bilateral decompressive lumbar laminectomies and formainectomies of L3-L4. PMH includes anxiety, depression, GERD, HTN, and R total knee arthroplasty  Clinical Impression  Pt presents to acute PT s/p the above mentioned surgery. Pt grossly min guard with log rolling from supine > sidelying > sit at the beginning of the session and progressed to mod I towards the end. Demonstrated sit > stand with mod I and good scooting technique to adjust towards EOB. Ambulated 500 ft. W/ mod I. Min guard ascending and descending 10 steps w/ step to and alternating patterns. Pt and spouse educated on post-surgery precautions, activity progression, and car transfers. Pt and spouse verbalized understanding. No further questions at end of session and education complete. Will s/o at this time. If needs change, please reconsult.    Recommendations for follow up therapy are one component of a multi-disciplinary discharge planning process, led by the attending physician.  Recommendations may be updated based on patient status, additional functional criteria and insurance authorization.  Follow Up Recommendations No PT follow up    Assistance Recommended at Discharge PRN  Functional Status Assessment Patient has had a recent decline in their functional status and demonstrates the ability to make significant improvements in function in a reasonable and predictable amount of time.  Equipment Recommendations  None recommended by PT    Recommendations for Other Services       Precautions / Restrictions Precautions Precautions: Back;Fall Precaution Booklet Issued: Yes (comment) Precaution Comments: Reviewed handout and pt was cued for precautions during functional mobility Required Braces or Orthoses: Other Brace (no brace needed per  MD) Restrictions Weight Bearing Restrictions: No      Mobility  Bed Mobility Overal bed mobility: Needs Assistance Bed Mobility: Sidelying to Sit;Rolling Rolling: Min guard Sidelying to sit: Min guard     Sit to sidelying: Min assist General bed mobility comments: needed verbal and tactile cues for log rolling technique; moved bed rail down and adjusted bed to simulate home environment    Transfers Overall transfer level: Needs assistance Equipment used: None Transfers: Sit to/from Stand Sit to Stand: Min guard;Modified independent (Device/Increase time)           General transfer comment: Pt was minguard at the beginning of the session and progressed to modI by the end. Pt required verbal cueing for larger BOS during Sit > stand. Pt demonstrated good scoot out to EOB and stood from raised bed height    Ambulation/Gait Ambulation/Gait assistance: Supervision;Modified independent (Device/Increase time) Gait Distance (Feet): 500 Feet Assistive device: None Gait Pattern/deviations: Wide base of support Gait velocity: decreased Gait velocity interpretation: 1.31 - 2.62 ft/sec, indicative of limited community ambulator   General Gait Details: Initially supervision at beginning of the session, progressed to Mod I. Pt ambulated with a slow, steady pace.  Stairs Stairs: Yes Stairs assistance: Min guard Stair Management: One rail Left;Forwards;Step to pattern;Alternating pattern Number of Stairs: 10 General stair comments: Pt verbally cued for sequencing on step to pattern. Pt showed determination to attempt alternating pattern while descending.  Wheelchair Mobility    Modified Rankin (Stroke Patients Only)       Balance Overall balance assessment: No apparent balance deficits (not formally assessed)  Pertinent Vitals/Pain Pain Assessment: 0-10 Pain Score: 7  Faces Pain Scale: Hurts little more Pain  Location: Low back at the incision Pain Descriptors / Indicators: Discomfort;Operative site guarding;Burning Pain Intervention(s): Monitored during session    Home Living Family/patient expects to be discharged to:: Private residence Living Arrangements: Spouse/significant other Available Help at Discharge: Family;Available 24 hours/day Type of Home: House Home Access: Stairs to enter Entrance Stairs-Rails: Left Entrance Stairs-Number of Steps: 5 Alternate Level Stairs-Number of Steps: 10+ Home Layout: Able to live on main level with bedroom/bathroom;Multi-level Home Equipment: Shower seat - built in;BSC/3in1 (Adjustable bed)      Prior Function Prior Level of Function : Independent/Modified Independent;Working/employed;Driving             Mobility Comments: independent at baseline ADLs Comments: independent at baseline     Hand Dominance        Extremity/Trunk Assessment   Upper Extremity Assessment Upper Extremity Assessment: Overall WFL for tasks assessed    Lower Extremity Assessment Lower Extremity Assessment: Overall WFL for tasks assessed    Cervical / Trunk Assessment Cervical / Trunk Assessment: Back Surgery  Communication   Communication: No difficulties  Cognition Arousal/Alertness: Awake/alert Behavior During Therapy: WFL for tasks assessed/performed Overall Cognitive Status: Within Functional Limits for tasks assessed                                 General Comments: wife notes pt is perseverative in conversation and more "chatty" when on pain meds        General Comments General comments (skin integrity, edema, etc.): wife present throughout session.    Exercises     Assessment/Plan    PT Assessment Patient does not need any further PT services  PT Problem List         PT Treatment Interventions      PT Goals (Current goals can be found in the Care Plan section)  Acute Rehab PT Goals Patient Stated Goal: Get back to  work PT Goal Formulation: All assessment and education complete, DC therapy    Frequency     Barriers to discharge        Co-evaluation               AM-PAC PT "6 Clicks" Mobility  Outcome Measure Help needed turning from your back to your side while in a flat bed without using bedrails?: A Little Help needed moving from lying on your back to sitting on the side of a flat bed without using bedrails?: A Little Help needed moving to and from a bed to a chair (including a wheelchair)?: None Help needed standing up from a chair using your arms (e.g., wheelchair or bedside chair)?: None Help needed to walk in hospital room?: None Help needed climbing 3-5 steps with a railing? : A Little 6 Click Score: 21    End of Session Equipment Utilized During Treatment: Gait belt Activity Tolerance: Patient tolerated treatment well Patient left: in bed;with call bell/phone within reach;with family/visitor present (sitting EOB awaiting RN to pull drain) Nurse Communication: Mobility status PT Visit Diagnosis: Other abnormalities of gait and mobility (R26.89);Pain Pain - part of body:  (Back)    Time: [id number] PT Time Calculation (min) (ACUTE ONLY): 39 min   Charges:   PT Evaluation $PT Eval Low Complexity: 1 Low PT Treatments $Gait Training: 23-37 mins        [place] Arshad Oberholzer, SPT  Jaia Alonge  08/25/2021, 11:06 AM

## 2021-08-25 NOTE — Discharge Summary (Signed)
Physician Discharge Summary  Patient ID: Stephen Scott MRN: [id number] DOB/AGE: [birth year] 60 y.o.  Admit date: 08/24/2021 Discharge date: 08/25/2021  Admission Diagnoses: Lumbar spinal stenosis bilateral L3-L4 radiculopathies with neurogenic claudication     Discharge Diagnoses: same   Discharged Condition: good  Hospital Course: The patient was admitted on 08/24/2021 and taken to the operating room where the patient underwent lami L3-4. The patient tolerated the procedure well and was taken to the recovery room and then to the floor in stable condition. The hospital course was routine. There were no complications. The wound remained clean dry and intact. Pt had appropriate back soreness. No complaints of leg pain or new N/T/W. The patient remained afebrile with stable vital signs, and tolerated a regular diet. The patient continued to increase activities, and pain was well controlled with oral pain medications.   Consults: None  Significant Diagnostic Studies:  Results for orders placed or performed during the hospital encounter of 08/24/21  SARS Coronavirus 2 by RT PCR (hospital order, performed in [facility] hospital lab) Nasopharyngeal Nasopharyngeal Swab   Specimen: Nasopharyngeal Swab  Result Value Ref Range   SARS Coronavirus 2 NEGATIVE NEGATIVE    DG Lumbar Spine 2-3 Views  Result Date: 08/24/2021 CLINICAL DATA:  Initial evaluation for L3-4 laminectomy, localization. EXAM: LUMBAR SPINE - 2-3 VIEW COMPARISON:  Prior radiograph from 11/10/2016 as well as previous MRI from 06/08/2021. FINDINGS: Sagittal views of the lumbar spine are provided. First image demonstrates a localization normal position posterior to the L3 spinous process at the level of the L3-4 interspace. Second image demonstrates a surgical trocar positioned at the L3-4 interspace. Vertebral bodies normally aligned with preservation of the normal lumbar lordosis. Vertebral body height maintained. Underlying  multilevel spondylosis, better evaluated on prior MRI. IMPRESSION: Intraoperative localization with a surgical instrument positioned at the L3-4 interspace. Electronically Signed   By: Jeannine Boga M.D.   On: 08/24/2021 19:24   DG Foot 2 Views Left  Result Date: 07/27/2021 Please see detailed radiograph report in office note.   Antibiotics:  Anti-infectives (From admission, onward)    Start     Dose/Rate Route Frequency Ordered Stop   08/25/21 0000  ceFAZolin (ANCEF) IVPB 2g/100 mL premix        2 g 200 mL/hr over 30 Minutes Intravenous Every 8 hours 08/24/21 1940 08/25/21 0725   08/24/21 1136  ceFAZolin (ANCEF) 2-4 GM/100ML-% IVPB       Note to Pharmacy: Gregery Na   : cabinet override      08/24/21 1136 08/24/21 1709   08/24/21 1130  ceFAZolin (ANCEF) IVPB 2g/100 mL premix        2 g 200 mL/hr over 30 Minutes Intravenous On call to O.R. 08/24/21 1126 08/24/21 1710       Discharge Exam: Blood pressure 121/73, pulse 72, temperature 97.9 F (36.6 C), temperature source Oral, resp. rate 18, height 5\' 11"  (1.803 m), weight 120.2 kg, SpO2 94 %. Neurologic: Grossly normal Ambulating well  Discharge Medications:   Allergies as of 08/25/2021       Reactions   Iodides Anaphylaxis, Hives, Other (See Comments)   Immediate reaction, looks like chicken pox   Tetanus Toxoids Other (See Comments)   Swelling and tenderness in arm > Local reaction. Older version of tetanus shot, tolerates the newer version         Medication List     STOP taking these medications    apixaban 5 MG Tabs tablet Commonly known as: ELIQUIS  TAKE these medications    acetaminophen 500 MG tablet Commonly known as: TYLENOL Take 500-1,000 mg by mouth every 6 (six) hours as needed for moderate pain.   amLODipine 10 MG tablet Commonly known as: NORVASC Take 10 mg by mouth daily.   atorvastatin 80 MG tablet Commonly known as: LIPITOR TAKE 1 TABLET(80 MG) BY MOUTH DAILY    chlorthalidone 25 MG tablet Commonly known as: HYGROTON Take 1 tablet (25 mg total) by mouth daily.   gabapentin 300 MG capsule Commonly known as: NEURONTIN Take 300 mg by mouth daily as needed (pain).   HYDROcodone-acetaminophen 5-325 MG tablet Commonly known as: NORCO/VICODIN Take 1 tablet by mouth every 4 (four) hours as needed for moderate pain.   loratadine 10 MG tablet Commonly known as: CLARITIN Take 10 mg by mouth daily as needed for allergies.   losartan 100 MG tablet Commonly known as: COZAAR Take 100 mg by mouth daily.   methocarbamol 500 MG tablet Commonly known as: Robaxin Take 1 tablet (500 mg total) by mouth 4 (four) times daily.   metoprolol succinate 25 MG 24 hr tablet Commonly known as: Toprol XL Take 25 mg every morning. If breakthrough palpitations last longer than 15-20 minutes, take 1/2 dose (one time per day).   nitroGLYCERIN 0.4 MG SL tablet Commonly known as: NITROSTAT Place 1 tablet (0.4 mg total) under the tongue every 5 (five) minutes as needed for chest pain.   SYSTANE OP Place 1 drop into both eyes daily as needed (dry eyes).   venlafaxine XR 150 MG 24 hr capsule Commonly known as: EFFEXOR-XR Take 150 mg by mouth daily with breakfast.        Disposition: home   Final Dx: lami L3-4  Discharge Instructions      Remove dressing in 72 hours   Complete by: As directed    Call MD for:  difficulty breathing, headache or visual disturbances   Complete by: As directed    Call MD for:  hives   Complete by: As directed    Call MD for:  persistant nausea and vomiting   Complete by: As directed    Call MD for:  redness, tenderness, or signs of infection (pain, swelling, redness, odor or green/yellow discharge around incision site)   Complete by: As directed    Call MD for:  severe uncontrolled pain   Complete by: As directed    Call MD for:  temperature >100.4   Complete by: As directed    Diet - low sodium heart healthy   Complete by:  As directed    Driving Restrictions   Complete by: As directed    No driving for 2 weeks, no riding in the car for 1 week   Increase activity slowly   Complete by: As directed    Lifting restrictions   Complete by: As directed    No lifting more than 8 lbs          Signed: Tiana Loft [place] 08/25/2021, 8:25 AM

## 2021-08-26 MED FILL — Thrombin (Recombinant) For Soln 5000 Unit: CUTANEOUS | Qty: 2 | Status: AC

## 2021-08-31 NOTE — Progress Notes (Deleted)
Cardiology Office Note   Date:  08/31/2021   ID:  Stephen Scott, DOB September 04, 1961, MRN [id number]  PCP:  Aliene BeamsHagler, Rachel, MD  Cardiologist:   Tylie Golonka Swazil[place]Jordan, MD   No chief complaint on file.    History of Present Illness: Stephen Scott is a 60 y.o. male who is seen for follow up of CAD and Afib.  He has a history of HTN and HLD. He presented in June 2021 with unstable angina. He underwent cardiac cath showing severe LCx disease successfully treated with DES. Initially he did very well with this. Lost about 40 lbs with dietary changes. Was doing Cardiac Rehab. Felt very well.   Since December he reported he has been under a lot of stress at work. Notes his BP has gone up. Has not been eating as well and has gained back about 20 lbs. Notes BP has been running high. Eating more salty food. He was started on chlorthalidone.  He presented to the clinic 06/08/2021 for follow-up evaluation stated he had noticed increased fatigue and palpitations.  He reported that he had increased stress at work due to employee enteractions.  He underwent left foot surgery. He had gained weight due to his inability to stay physically active related to his left foot and his back/sciatica pain.  sleep study was ordered.    His echocardiogram showed an EF of 60-65%, G1 DD, mildly dilated left atria and no significant valvular abnormalities.  His cardiac event monitor showed sinus rhythm and paroxysmal atrial fibrillation.  He was started on metoprolol and apixaban.   He did undergo lumbar surgery in November without complications.      Past Medical History:  Diagnosis Date   Anemia    as a small child   Anxiety    Arthritis    "lower spine; knees" (11/01/2017)   Chronic lower back pain    Depression    GERD (gastroesophageal reflux disease)    Headache    High cholesterol    History of kidney stones    Hypertension    Pneumonia 06/2017; 08/2017   walking pneumonia; treated   Spinal stenosis    "lower  back" (11/01/2017)    Past Surgical History:  Procedure Laterality Date   CORONARY STENT INTERVENTION N/A 03/04/2020   Procedure: CORONARY STENT INTERVENTION;  Surgeon: SwazilandJordan, Joedy Eickhoff M, MD;  Location: [facility];  Service: Cardiovascular;  Laterality: N/A;   EYE SURGERY Bilateral 2018   Cataracts removed by Dr. Nile RiggsShapiro   FOOT SURGERY Left 04/2021   HAND RECONSTRUCTION Left 1989   "injured when washing dishes"   INTRAVASCULAR PRESSURE WIRE/FFR STUDY N/A 03/04/2020   Procedure: INTRAVASCULAR PRESSURE WIRE/FFR STUDY;  Surgeon: SwazilandJordan, Gwendelyn Lanting M, MD;  [facility];  Service: Cardiovascular;  Laterality: N/A;   INTRAVASCULAR ULTRASOUND/IVUS N/A 03/04/2020   Procedure: Intravascular Ultrasound/IVUS;  Surgeon: SwazilandJordan, Lilyauna Miedema M, MD;  [facility];  Service: Cardiovascular;  Laterality: N/A;   JOINT REPLACEMENT     KNEE ARTHROSCOPY Right 2008   LEFT HEART CATH AND CORONARY ANGIOGRAPHY N/A 03/04/2020   Procedure: LEFT HEART CATH AND CORONARY ANGIOGRAPHY;  Surgeon: SwazilandJordan, Romi Rathel M, MD;  [facility];  Service: Cardiovascular;  Laterality: N/A;   LUMBAR LAMINECTOMY/DECOMPRESSION MICRODISCECTOMY Bilateral 08/24/2021   Procedure: LAMINECTOMY, FORAMINOTOMY L34;  Surgeon: Donalee Citrinram, Gary, MD;  [facility];  Service: Neurosurgery;  Laterality: Bilateral;  3C   TOTAL KNEE ARTHROPLASTY Right 11/01/2017   TOTAL KNEE ARTHROPLASTY Right  11/01/2017   Procedure: RIGHT TOTAL KNEE ARTHROPLASTY;  Surgeon: Kathryne Hitch, MD;  Location: [facility] OR;  Service: Orthopedics;  Laterality: Right;   WISDOM TOOTH EXTRACTION  1981     Current Outpatient Medications  Medication Sig Dispense Refill   acetaminophen (TYLENOL) 500 MG tablet Take 500-1,000 mg by mouth every 6 (six) hours as needed for moderate pain.     amLODipine (NORVASC) 10 MG tablet Take 10 mg by mouth daily.     atorvastatin (LIPITOR) 80 MG tablet TAKE 1 TABLET(80 MG) BY MOUTH DAILY 90 tablet 3    chlorthalidone (HYGROTON) 25 MG tablet Take 1 tablet (25 mg total) by mouth daily. 90 tablet 3   gabapentin (NEURONTIN) 300 MG capsule Take 300 mg by mouth daily as needed (pain).     HYDROcodone-acetaminophen (NORCO/VICODIN) 5-325 MG tablet Take 1 tablet by mouth every 4 (four) hours as needed for moderate pain. 20 tablet 0   loratadine (CLARITIN) 10 MG tablet Take 10 mg by mouth daily as needed for allergies.      losartan (COZAAR) 100 MG tablet Take 100 mg by mouth daily.     methocarbamol (ROBAXIN) 500 MG tablet Take 1 tablet (500 mg total) by mouth 4 (four) times daily. 45 tablet 0   metoprolol succinate (TOPROL XL) 25 MG 24 hr tablet Take 25 mg every morning. If breakthrough palpitations last longer than 15-20 minutes, take 1/2 dose (one time per day). 90 tablet 3   nitroGLYCERIN (NITROSTAT) 0.4 MG SL tablet Place 1 tablet (0.4 mg total) under the tongue every 5 (five) minutes as needed for chest pain. 90 tablet 3   Polyethyl Glycol-Propyl Glycol (SYSTANE OP) Place 1 drop into both eyes daily as needed (dry eyes).     venlafaxine XR (EFFEXOR-XR) 150 MG 24 hr capsule Take 150 mg by mouth daily with breakfast.     No current facility-administered medications for this visit.    Allergies:   Iodides and Tetanus toxoids    Social History:  The patient  reports that he has never smoked. He has never used smokeless tobacco. He reports current alcohol use of about 1.0 - 2.0 standard drink per week. He reports that he does not use drugs.   Family History:  The patient's family history includes Clotting disorder in his mother; Diabetes in his mother; Heart attack in his brother; Heart disease in his brother; Hyperlipidemia in his mother; Hypertension in his mother; Pulmonary embolism in his mother.    ROS:  Please see the history of present illness.   Otherwise, review of systems are positive for none.   All other systems are reviewed and negative.    PHYSICAL EXAM: VS:  There were no vitals  taken for this visit. , BMI There is no height or weight on file to calculate BMI. GEN: Well nourished, overweight, in no acute distress  HEENT: normal  Neck: no JVD, carotid bruits, or masses Cardiac: RRR; no murmurs, rubs, or gallops,no edema  Respiratory:  clear to auscultation bilaterally, normal work of breathing GI: soft, nontender, nondistended, + BS MS: no deformity or atrophy  Skin: warm and dry, no rash Neuro:  Strength and sensation are intact Psych: euthymic mood, full affect   EKG:  EKG is not ordered today.     Recent Labs: 11/14/2020: ALT 33 08/17/2021: BUN 25; Creatinine, Ser 0.88; Hemoglobin 14.7; Platelets 203; Potassium 3.9; Sodium 139    Lipid Panel    Component Value Date/Time   CHOL 174 11/14/2020  1513   TRIG 113 11/14/2020 1513   HDL 52 11/14/2020 1513   CHOLHDL 3.3 11/14/2020 1513   LDLCALC 102 (H) 11/14/2020 1513     Wt Readings from Last 3 Encounters:  08/24/21 265 lb (120.2 kg)  08/17/21 271 lb 4.8 oz (123.1 kg)  08/17/21 272 lb (123.4 kg)      Other studies Reviewed: Additional studies/ records that were reviewed today include:   Labs dated 02/21/20: cholesterol 204, triglycerides 133, HDL 44, LDL 136. CBC, chemistries and TSH  Normal. Dated 03/04/20: normal CBC.  Labs dated 05/05/20: A1c 5.2%. cholesterol 119, triglycerides 138, HDL 34, LDL 61. Normal CMET Dated 03/04/21: A1c 6%. Cholesterol 168, triglycerides 136, HDL 47, LDL 97. CMET and TSH normal  Cardiac cath 03/04/20:  CORONARY STENT INTERVENTION  INTRAVASCULAR PRESSURE WIRE/FFR STUDY  Intravascular Ultrasound/IVUS  LEFT HEART CATH AND CORONARY ANGIOGRAPHY  Conclusion    Prox Cx to Mid Cx lesion is 80% stenosed. Post intervention, there is a 0% residual stenosis. A drug-eluting stent was successfully placed using a STENT RESOLUTE ONYX 3.5X22. The left ventricular systolic function is normal. LV end diastolic pressure is normal. The left ventricular ejection fraction is 55-65% by  visual estimate.   1. Single vessel obstructive CAD involving the LCx. 2. Normal LV function 3. Normal LVEDP 4. Successful PCI of the LCx using FFR and IVUS guidance with DES x 1.   Plan: anticipate same day DC. DAPT with ASA and Brilinta x 1 year.    Echocardiogram 06/30/2021 IMPRESSIONS     1. Left ventricular ejection fraction, by estimation, is 60 to 65%. The  left ventricle has normal function. The left ventricle has no regional  wall motion abnormalities. Left ventricular diastolic parameters are  consistent with Grade I diastolic  dysfunction (impaired relaxation).   2. Right ventricular systolic function is normal. The right ventricular  size is normal. Tricuspid regurgitation signal is inadequate for assessing  PA pressure.   3. Left atrial size was mildly dilated.   4. The mitral valve is normal in structure. No evidence of mitral valve  regurgitation. No evidence of mitral stenosis.   5. The aortic valve is normal in structure. Aortic valve regurgitation is  not visualized. No aortic stenosis is present.   6. The inferior vena cava is normal in size with greater than 50%  respiratory variability, suggesting right atrial pressure of 3 mmHg.   Comparison(s): No prior Echocardiogram.    Cardiac event monitor 07/07/2021 Normal sinus rhythm Paroxysmal Atrial fibrillation. burden of 25%. rate 51-191 with average of 98 bpm. one episode of aberrancy.     Patch Wear Time:  5 days and 8 hours (2022-09-28T10:09:29-0400 to 2022-10-03T18:51:11-398)   Patient had a min HR of 40 bpm, max HR of 197 bpm, and avg HR of 88 bpm. Predominant underlying rhythm was Sinus Rhythm. 1 run of Ventricular Tachycardia occurred lasting 7 beats with a max rate of 197 bpm (avg 174 bpm). Atrial Fibrillation occurred (25%  burden), ranging from 51-191 bpm (avg of 98 bpm), the longest lasting 1 hour 38 mins with an avg rate of 98 bpm. Isolated SVEs were rare (<1.0%, 1307), SVE Couplets were rare  (<1.0%, 28), and SVE Triplets were rare (<1.0%, 5). Isolated VEs were rare  (<1.0%, 860), VE Couplets were rare (<1.0%, 1), and VE Triplets were rare (<1.0%, 1). Ventricular Bigeminy and Trigeminy were present.   ASSESSMENT AND PLAN:  1.  CAD s/p DES of LCx in June 2021. He is having  some atypical symptoms more related to stress and elevated BP. Stressed importance of healthy eating, stress management, and exercise. Symptoms have improved with better BP control. For now will monitor symptoms and really focus on lifestyle modification. 2 . HTN. Reinforce lifestyle modification with diet, weight loss, low sodium. Will continue chlorthalidone 25 mg daily. Check BMET today 3. Hypercholesterolemia. LDL 136>>61>>102. on lipitor 80 mg daily . I suspect increase LDL related to poor diet and weight gain. Will reinforce healthy eating and weight loss. Repeat lipids in 3 months. 4. Paroxysmal Afib. [place] vasc score of ... 5. Obesity encourage continued lifestyle modification.  6. Situational stress. Patient is seeking counseling.  7. Family history of CAD.     Current medicines are reviewed at length with the patient today.  The patient does not have concerns regarding medicines.  The following changes have been made:  See above  Labs/ tests ordered today include:BMET  No orders of the defined types were placed in this encounter.    Disposition:   FU 3 months with fasting lab.  Signed, Adamariz Gillott Martinique, MD  08/31/2021 3:56 PM    [facility] [address], [place], [place], [address] Phone [phone], Fax [phone]

## 2021-09-04 ENCOUNTER — Ambulatory Visit: Payer: 59 | Admitting: Cardiology

## 2021-12-21 ENCOUNTER — Telehealth: Payer: Self-pay | Admitting: Cardiology

## 2021-12-21 MED ORDER — APIXABAN 5 MG PO TABS: 5.0000 mg | ORAL_TABLET | Freq: Two times a day (BID) | ORAL | 1 refills | Status: DC

## 2021-12-21 NOTE — Telephone Encounter (Signed)
He has been on Eliquis for paroxysmal AFib. This was held for spinal surgery but should have been resumed. Clearly needs OV to review symptoms ? ?Vivan Agostino [place] MD, [facility] ? ?

## 2021-12-21 NOTE — Telephone Encounter (Signed)
**Note De-Identified Jaterrius Ricketson Obfuscation** Eliquis is no longer on the pts med list. ?Forwarding to Dr Swaziland and his nurse for advisement. ?

## 2021-12-21 NOTE — Telephone Encounter (Signed)
New Message: ? ? ? ? ?Patient's wife called. She says patient needs a prior authorization for his Eliquis. Please to [facility] on [address] and [address] ?

## 2021-12-21 NOTE — Telephone Encounter (Signed)
Pt c/o of Chest Pain: STAT if CP now or developed within 24 hours ? ?1. Are you having CP right now? Not at this time, he at work ? ?2. Are you experiencing any other symptoms (ex. SOB, nausea, vomiting, sweating)? Shortness of breath when doing, heart rate been going up around 100r  ? ?3. How long have you been experiencing CP? yesterday ? ?4. Is your CP continuous or coming and going? comes and goes ? ?5. Have you taken Nitroglycerin? Yes- wife said patient needs to be seen- made an appointment for Thursday(12-24-29), offered him an appointment for Tuesday(12-22-21)- could not get off work- please call to evaluate ?? ? ?

## 2021-12-21 NOTE — Telephone Encounter (Signed)
Spoke with patient's wife -- she reports he told her yesterday afternoon he has been having chest pain. Yesterday was the first time he mentioned it but she reports he has been coming home from work for a few weeks exhausted/irritable. He used NTG yesterday afternoon - she thinks he got relief - took nap - cooked dinner - but then was exhausted after. She states his presentation now is similar to 2021 that preceded his stent.  ? ?He has a new job and has troubles getting off work - is not supposed to take off for the 1st 6 months - was scheduled for 3/30 as he told wife he may could make an early AM appointment  ? ?Patient has been out of Eliquis for about 1 month ?Med was d/c'ed by neurosurgery team 08/25/21 ?Patient did not follow up with Dr. Saintclair Halsted post-op ?BP monitor at home indicates irregular rhtyhm  ? ?Wife states new insurance requires PAs for lots of his meds, so he has been having trouble getting some.. there is already a phone note in the chart that Eliquis needs a PA but since it is not on med list, need MD to reviwe ? ?Advised wife would route message to Martinique MD, Saintclair Halsted MD and nurse ? ?Asked that she call or MyChart back if patient cannot make visit as this is important for him to be seen  ?

## 2021-12-21 NOTE — Telephone Encounter (Signed)
Spoke with patient's wife. Relayed that [place] MD advised to resume Eliquis. Rx(s) sent to pharmacy electronically. Message sent to [facility] LPN about PA for this.  ? ?Advised he should be seen this week and to call if scheduled appointment will not work out.  ?

## 2021-12-22 NOTE — Progress Notes (Signed)
? ?Office Visit  ?  ?Patient Name: Stephen Scott ?Date of Encounter: 12/24/2021 ? ?PCP:  Caren Macadam, MD ?  ?[place]  ?Cardiologist:  Peter Martinique, MD  ?Advanced Practice Provider:  No care team member to display ?Electrophysiologist:  None  ? ? ?Chief Complaint  ?  ?Stephen Scott is a 61 y.o. male with a hx of CAD (02/2020 PCI/DES to Cx), hyperlipidemia, paroxysmal atrial fibrillation, hypertension  presents today for chest pain ? ?Past Medical History  ?  ?Past Medical History:  ?Diagnosis Date  ? Anemia   ? as a small child  ? Anxiety   ? Arthritis   ? "lower spine; knees" (11/01/2017)  ? Chronic lower back pain   ? Depression   ? GERD (gastroesophageal reflux disease)   ? Headache   ? High cholesterol   ? History of kidney stones   ? Hypertension   ? Pneumonia 06/2017; 08/2017  ? walking pneumonia; treated  ? Spinal stenosis   ? "lower back" (11/01/2017)  ? ?Past Surgical History:  ?Procedure Laterality Date  ? CORONARY STENT INTERVENTION N/A 03/04/2020  ? Procedure: CORONARY STENT INTERVENTION;  Surgeon: Martinique, Peter M, MD;  Location: [place] CV LAB;  Service: Cardiovascular;  Laterality: N/A;  ? EYE SURGERY Bilateral 2018  ? Cataracts removed by Dr. Gershon Crane  ? FOOT SURGERY Left 04/2021  ? HAND RECONSTRUCTION Left 1989  ? "injured when washing dishes"  ? INTRAVASCULAR PRESSURE WIRE/FFR STUDY N/A 03/04/2020  ? Procedure: INTRAVASCULAR PRESSURE WIRE/FFR STUDY;  Surgeon: Martinique, Peter M, MD;  Location: [place] CV LAB;  Service: Cardiovascular;  Laterality: N/A;  ? INTRAVASCULAR ULTRASOUND/IVUS N/A 03/04/2020  ? Procedure: Intravascular Ultrasound/IVUS;  Surgeon: Martinique, Peter M, MD;  Location: [place] CV LAB;  Service: Cardiovascular;  Laterality: N/A;  ? JOINT REPLACEMENT    ? KNEE ARTHROSCOPY Right 2008  ? LEFT HEART CATH AND CORONARY ANGIOGRAPHY N/A 03/04/2020  ? Procedure: LEFT HEART CATH AND CORONARY ANGIOGRAPHY;  Surgeon: Martinique, Peter M, MD;  Location: [place] CV  LAB;  Service: Cardiovascular;  Laterality: N/A;  ? LUMBAR LAMINECTOMY/DECOMPRESSION MICRODISCECTOMY Bilateral 08/24/2021  ? Procedure: LAMINECTOMY, FORAMINOTOMY L34;  Surgeon: Kary Kos, MD;  Location: [facility];  Service: Neurosurgery;  Laterality: Bilateral;  3C  ? TOTAL KNEE ARTHROPLASTY Right 11/01/2017  ? TOTAL KNEE ARTHROPLASTY Right 11/01/2017  ? Procedure: RIGHT TOTAL KNEE ARTHROPLASTY;  Surgeon: Mcarthur Rossetti, MD;  Location: [facility];  Service: Orthopedics;  Laterality: Right;  ? [place]  ? ? ?Allergies ? ?Allergies  ?Allergen Reactions  ? Iodides Anaphylaxis, Hives and Other (See Comments)  ?  Immediate reaction, looks like chicken pox  ? Tetanus Toxoids Other (See Comments)  ?  Swelling and tenderness in arm > Local reaction. Older version of tetanus shot, tolerates the newer version   ? ? ?History of Present Illness  ?  ?Stephen Scott is a 61 y.o. male with a hx of CAD (02/2020 PCI/DES to Cx), hyperlipidemia, paroxysmal atrial fibrillation, hypertension  last seen 08/17/21 by Coletta Memos, NP. ? ?Chlorthalidone previously initiated and uptitrated for elevated BP. He has had significant weight with diet and exercise changes. When seen 05/2021 he noted increased dyspnea, lack of sleep. Echo performed echocardiogram performed revealing LVEF 60 to 123456, grade 1 diastolic dysfunction, mildly dilated atria, no significant valvular abnormalities.  Cardiac event monitor showed sinus rhythm with paroxysmal atrial fibrillation.  He was started on metoprolol and apixaban. ? ?He had a  laminectomy late November and recovered at home for 1 month. Tells me since last seen his episodes of atrial fibrillation have gotten worse and more frequent. Notes may be triggered by more recent stress with new job. Has been trying to reduce alcohol and caffeine intake to prevent. Palpitations feel like heart is pounding and an almost burning like sensation in his neck as well as some jaw pain. Feels somewhat  similar to his anginal equivalent. He took 2 nitroglycerin on Sunday with some relief. He has not taken any extra Metoprolol. He has been taking Eliquis only once per day as he was unaware to take twice per day. Notes his depression has been more notable recently and recently was off his Effexor for 1 month as he did not pick up refill, resumed 2 days ago.  ? ?EKGs/Labs/Other Studies Reviewed:  ? ?The following studies were reviewed today: ?Cardiac catheterization 03/04/2020 ?  ?Prox Cx to Mid Cx lesion is 80% stenosed. ?Post intervention, there is a 0% residual stenosis. ?A drug-eluting stent was successfully placed using a STENT RESOLUTE ONYX 3.5X22. ?The left ventricular systolic function is normal. ?LV end diastolic pressure is normal. ?The left ventricular ejection fraction is 55-65% by visual estimate. ?  ?1. Single vessel obstructive CAD involving the LCx. ?2. Normal LV function ?3. Normal LVEDP ?4. Successful PCI of the LCx using FFR and IVUS guidance with DES x 1. ?  ?Plan: anticipate same day DC. DAPT with ASA and Brilinta x 1 year.  ?  ?Diagnostic ?Dominance: Right ?Intervention ?  ?  ?Echocardiogram 06/30/2021 ?IMPRESSIONS  ? ? ? 1. Left ventricular ejection fraction, by estimation, is 60 to 65%. The  ?left ventricle has normal function. The left ventricle has no regional  ?wall motion abnormalities. Left ventricular diastolic parameters are  ?consistent with Grade I diastolic  ?dysfunction (impaired relaxation).  ? 2. Right ventricular systolic function is normal. The right ventricular  ?size is normal. Tricuspid regurgitation signal is inadequate for assessing  ?PA pressure.  ? 3. Left atrial size was mildly dilated.  ? 4. The mitral valve is normal in structure. No evidence of mitral valve  ?regurgitation. No evidence of mitral stenosis.  ? 5. The aortic valve is normal in structure. Aortic valve regurgitation is  ?not visualized. No aortic stenosis is present.  ? 6. The inferior vena cava is normal in  size with greater than 50%  ?respiratory variability, suggesting right atrial pressure of 3 mmHg.  ? ?Comparison(s): No prior Echocardiogram.  ?  ?Cardiac event monitor 07/07/2021 ?Normal sinus rhythm ?Paroxysmal Atrial fibrillation. burden of 25%. rate 51-191 with average of 98 bpm. one episode of aberrancy. ?  ?  ?Patch Wear Time:  5 days and 8 hours (2022-09-28T10:09:29-0400 to 2022-10-03T18:51:11-398) ?  ?Patient had a min HR of 40 bpm, max HR of 197 bpm, and avg HR of 88 bpm. Predominant underlying rhythm was Sinus Rhythm. 1 run of Ventricular Tachycardia occurred lasting 7 beats with a max rate of 197 bpm (avg 174 bpm). Atrial Fibrillation occurred (25% ? burden), ranging from 51-191 bpm (avg of 98 bpm), the longest lasting 1 hour 38 mins with an avg rate of 98 bpm. Isolated SVEs were rare (<1.0%, 1307), SVE Couplets were rare (<1.0%, 28), and SVE Triplets were rare (<1.0%, 5). Isolated VEs were rare  ?(<1.0%, 860), VE Couplets were rare (<1.0%, 1), and VE Triplets were rare (<1.0%, 1). Ventricular Bigeminy and Trigeminy were present.  ?  ?EKG:  EKG is  ordered today.  The ekg ordered today demonstrates SR 70 bpm  ? ?Recent Labs: ?08/17/2021: BUN 25; Creatinine, Ser 0.88; Hemoglobin 14.7; Platelets 203; Potassium 3.9; Sodium 139  ?Recent Lipid Panel ?   ?Component Value Date/Time  ? CHOL 174 11/14/2020 1513  ? TRIG 113 11/14/2020 1513  ? HDL 52 11/14/2020 1513  ? CHOLHDL 3.3 11/14/2020 1513  ? LDLCALC 102 (H) 11/14/2020 1513  ? ? ?Risk Assessment/Calculations:  ? ?CHA2DS2-VASc Score = 2  ? This indicates a 2.2% annual risk of stroke. ?The patient's score is based upon: ?CHF History: 0 ?HTN History: 1 ?Diabetes History: 0 ?Stroke History: 0 ?Vascular Disease History: 1 ?Age Score: 0 ?Gender Score: 0 ?  ?Home Medications  ? ?Current Meds  ?Medication Sig  ? acetaminophen (TYLENOL) 500 MG tablet Take 500-1,000 mg by mouth every 6 (six) hours as needed for moderate pain.  ? apixaban (ELIQUIS) 5 MG TABS tablet  Take 1 tablet (5 mg total) by mouth 2 (two) times daily.  ? atorvastatin (LIPITOR) 80 MG tablet TAKE 1 TABLET(80 MG) BY MOUTH DAILY  ? chlorthalidone (HYGROTON) 25 MG tablet Take 1 tablet (25 mg total) by

## 2021-12-22 NOTE — Telephone Encounter (Signed)
**Note De-Identified Maleki Hippe Obfuscation** I attempted this Eliquis PA several times without success so I called the pharmacist at [facility] pharmacy to be sure I have the correct ins. information and was advised that a PA is not required and that the pt picked his Eliquis up yesterday. ?She states that the pts insurance did pay and that the pts cost was $40/30 day supply. ?

## 2021-12-23 NOTE — Telephone Encounter (Signed)
Patient has appointment with Gillian Shields NP 12/24/21 at 8:25 am at [facility] location. ?

## 2021-12-23 NOTE — Telephone Encounter (Signed)
See previous telephone note. 

## 2021-12-24 ENCOUNTER — Telehealth (HOSPITAL_BASED_OUTPATIENT_CLINIC_OR_DEPARTMENT_OTHER): Payer: Self-pay

## 2021-12-24 ENCOUNTER — Encounter (HOSPITAL_BASED_OUTPATIENT_CLINIC_OR_DEPARTMENT_OTHER): Payer: Self-pay | Admitting: Family

## 2021-12-24 ENCOUNTER — Telehealth: Payer: Self-pay | Admitting: *Deleted

## 2021-12-24 ENCOUNTER — Ambulatory Visit (INDEPENDENT_AMBULATORY_CARE_PROVIDER_SITE_OTHER): Payer: Managed Care, Other (non HMO) | Admitting: Family

## 2021-12-24 VITALS — BP 120/70 | HR 70 | Ht 71.0 in | Wt 270.4 lb

## 2021-12-24 DIAGNOSIS — R079 Chest pain, unspecified: Secondary | ICD-10-CM

## 2021-12-24 DIAGNOSIS — G4730 Sleep apnea, unspecified: Secondary | ICD-10-CM

## 2021-12-24 DIAGNOSIS — D6859 Other primary thrombophilia: Secondary | ICD-10-CM | POA: Diagnosis not present

## 2021-12-24 DIAGNOSIS — I480 Paroxysmal atrial fibrillation: Secondary | ICD-10-CM

## 2021-12-24 LAB — CBC
Hematocrit: 47 % (ref 37.5–51.0)
Hemoglobin: 15.3 g/dL (ref 13.0–17.7)
MCH: 28.4 pg (ref 26.6–33.0)
MCHC: 32.6 g/dL (ref 31.5–35.7)
MCV: 87 fL (ref 79–97)
Platelets: 242 10*3/uL (ref 150–450)
RBC: 5.39 x10E6/uL (ref 4.14–5.80)
RDW: 14.8 % (ref 11.6–15.4)
WBC: 8.1 10*3/uL (ref 3.4–10.8)

## 2021-12-24 LAB — COMPREHENSIVE METABOLIC PANEL
ALT: 28 IU/L (ref 0–44)
AST: 22 IU/L (ref 0–40)
Albumin/Globulin Ratio: 1.9 (ref 1.2–2.2)
Albumin: 4.7 g/dL (ref 3.8–4.8)
Alkaline Phosphatase: 89 IU/L (ref 44–121)
BUN/Creatinine Ratio: 18 (ref 10–24)
BUN: 15 mg/dL (ref 8–27)
Bilirubin Total: 0.4 mg/dL (ref 0.0–1.2)
CO2: 21 mmol/L (ref 20–29)
Calcium: 9.5 mg/dL (ref 8.6–10.2)
Chloride: 99 mmol/L (ref 96–106)
Creatinine, Ser: 0.83 mg/dL (ref 0.76–1.27)
Globulin, Total: 2.5 g/dL (ref 1.5–4.5)
Glucose: 156 mg/dL — ABNORMAL HIGH (ref 70–99)
Potassium: 3.8 mmol/L (ref 3.5–5.2)
Sodium: 140 mmol/L (ref 134–144)
Total Protein: 7.2 g/dL (ref 6.0–8.5)
eGFR: 99 mL/min/{1.73_m2} (ref >59)

## 2021-12-24 LAB — D-DIMER, QUANTITATIVE: D-DIMER: 0.25 mg/L FEU (ref 0.00–0.49)

## 2021-12-24 MED ORDER — METOPROLOL TARTRATE 25 MG PO TABS: 12.5000 mg | ORAL_TABLET | ORAL | 2 refills | Status: DC | PRN

## 2021-12-24 MED ORDER — NITROGLYCERIN 0.4 MG SL SUBL: 0.4000 mg | SUBLINGUAL_TABLET | SUBLINGUAL | 3 refills | Status: DC | PRN

## 2021-12-24 MED ORDER — METOPROLOL SUCCINATE ER 25 MG PO TB24: 25.0000 mg | ORAL_TABLET | Freq: Every day | ORAL | 5 refills | Status: DC

## 2021-12-24 MED ORDER — APIXABAN 5 MG PO TABS: 5.0000 mg | ORAL_TABLET | Freq: Two times a day (BID) | ORAL | 3 refills | Status: DC

## 2021-12-24 NOTE — Patient Instructions (Addendum)
Medication Instructions:  ?Your physician has recommended you make the following change in your medication: ? ?CHANGE Metoprolol Succinate to 50mg  daily  ? ?START Metoprolol Tartrate 12.5mg  (half tablet) as needed for palpitations ?*This is a faster acting beta blocker to help lower heart rate* ? ?Change: Eliquis 5mg  twice per day  ? ?*If you need a refill on your cardiac medications before your next appointment, please call your pharmacy* ? ? ?Lab Work: ?Your physician recommends that you return for lab work in: lipid panel, CMP, TSH, CBC, d-dimer ? ?If you have labs (blood work) drawn today and your tests are completely normal, you will receive your results only by: ?MyChart Message (if you have MyChart) OR ?A paper copy in the mail ?If you have any lab test that is abnormal or we need to change your treatment, we will call you to review the results. ? ? ?Testing/Procedures: ? ?Your Physician has requested you have a Myocardial Perfusion Imaging Study.  ? ? ?Please arrive 15 minutes prior to your appointment time for registration and insurance purposes.  ? ?The test will take approximately 3 to 4 hours to complete; you may bring reading material. If someone comes with you to your appointment, they will need to remain in the main lobby due to limited testing space in the testing area. **If you are pregnant or breastfeeding, please notify the nuclear lab prior to your appointment**  ? ?How to prepare for your test:  ?- Do not eat or drink 3 hours prior to your test, except you may have water  ?- Do not consume products containing caffeine ( regular or decaf) 12 hours prior to your test ( coffee, chocolate, sodas or teas)  ?- Do bring a list of your current medications with you. If not listed below, you may take your medications as normal (Hold beta blocker- 24 hour prior for exercise myoview)   ?- Do wear comfortable clothes (no dresses or overalls) and walking shoes ( tennis shoes preferred), no heel or open toe  shoes are allowed ?- Do not wear cologne, perfume, aftershave, or lotions ( deodorant is allowed)  ?- If these instructions are not followed, your test will be rescheduled  ? ?If you cannot keep your appointment, please provide 24 hours notice to the Nuclear Lab, to avoid a possible $50 charge to your account!  ? ? ?WatchPAT??  Is a FDA cleared portable home sleep study test that uses a watch and 3 points of contact to monitor 7 different channels, including your heart rate, oxygen saturations, body position, snoring, and chest motion.  The study is easy to use from the comfort of your own home and accurately detect sleep apnea.  Before bed, you attach the chest sensor, attached the sleep apnea bracelet to your nondominant hand, and attach the finger probe.  After the study, the raw data is downloaded from the watch and scored for apnea events.   ?For more information: [email or web address]  ? ?Follow-Up: ?At [facility], you and your health needs are our priority.  As part of our continuing mission to provide you with exceptional heart care, we have created designated Provider Care Teams.  These Care Teams include your primary Cardiologist (physician) and Advanced Practice Providers (APPs -  Physician Assistants and Nurse Practitioners) who all work together to provide you with the care you need, when you need it. ? ?We recommend signing up for the patient portal called "MyChart".  Sign up information is provided on this After Visit  Summary.  MyChart is used to connect with patients for Virtual Visits (Telemedicine).  Patients are able to view lab/test results, encounter notes, upcoming appointments, etc.  Non-urgent messages can be sent to your provider as well.   ?To learn more about what you can do with MyChart, go to [email or web address].   ? ?Your next appointment:   ?4-6 week(s) ? ?The format for your next appointment:   ?In Person ? ?Provider:   ?Peter Martinique, MD or Advanced Practice  Provider  ? ? ?Other Instructions ? ?Heart Healthy Diet Recommendations: ?A low-salt diet is recommended. Meats should be grilled, baked, or boiled. Avoid fried foods. Focus on lean protein sources like fish or chicken with vegetables and fruits. The American Heart Association is a Microbiologist!  American Heart Association Diet and Lifeystyle Recommendations   ? ?Exercise recommendations: ?The American Heart Association recommends 150 minutes of moderate intensity exercise weekly. ?Try 30 minutes of moderate intensity exercise 4-5 times per week. ?This could include walking, jogging, or swimming. ? ?We are considering referral to psychology today. Mental health is an important part of heart health. You have been referred to Dr. Elias Else. He is located at [facility] at [facility].  ?

## 2021-12-24 NOTE — Telephone Encounter (Signed)
Left message with ok to proceed with sleep study pin given [id number] ?

## 2021-12-24 NOTE — Telephone Encounter (Signed)
Per [employer] no PA is required for [facility]amar. (CPT 95800). Rutherford Guys, RN notified. Call reference # [id number]. ?

## 2021-12-25 ENCOUNTER — Telehealth: Payer: Self-pay | Admitting: Family

## 2021-12-25 ENCOUNTER — Telehealth: Payer: Self-pay

## 2021-12-25 ENCOUNTER — Telehealth (HOSPITAL_BASED_OUTPATIENT_CLINIC_OR_DEPARTMENT_OTHER): Payer: Self-pay

## 2021-12-25 ENCOUNTER — Encounter (HOSPITAL_BASED_OUTPATIENT_CLINIC_OR_DEPARTMENT_OTHER): Payer: Self-pay

## 2021-12-25 LAB — LIPID PANEL
Chol/HDL Ratio: 4.3 ratio (ref 0.0–5.0)
Cholesterol, Total: 183 mg/dL (ref 100–199)
HDL: 43 mg/dL (ref >39)
LDL Chol Calc (NIH): 118 mg/dL — ABNORMAL HIGH (ref 0–99)
Triglycerides: 120 mg/dL (ref 0–149)
VLDL Cholesterol Cal: 22 mg/dL (ref 5–40)

## 2021-12-25 LAB — TSH: TSH: 2.95 u[IU]/mL (ref 0.450–4.500)

## 2021-12-25 MED ORDER — ATORVASTATIN CALCIUM 80 MG PO TABS: 80.0000 mg | ORAL_TABLET | Freq: Every day | ORAL | 3 refills | Status: DC

## 2021-12-25 MED ORDER — METOPROLOL SUCCINATE ER 50 MG PO TB24: 50.0000 mg | ORAL_TABLET | Freq: Every day | ORAL | 3 refills | Status: DC

## 2021-12-25 NOTE — Telephone Encounter (Signed)
*  STAT* If patient is at the pharmacy, call can be transferred to refill team.   1. Which medications need to be refilled? (please list name of each medication and dose if known) atorvastatin (LIPITOR) 80 MG tablet  2. Which pharmacy/location (including street and city if local pharmacy) is medication to be sent to? [id number] - [place], [place] - [address]  3. Do they need a 30 day or 90 day supply? 90 day  

## 2021-12-25 NOTE — Telephone Encounter (Signed)
Sent in new prescription for patient from 3/30 appointment and sent mychart message with information regarding PREP program.  ?

## 2021-12-25 NOTE — Telephone Encounter (Signed)
Rx(s) sent to pharmacy electronically.  

## 2021-12-25 NOTE — Telephone Encounter (Signed)
Called to discuss PREP program. Left voicemail.  ?

## 2021-12-25 NOTE — Addendum Note (Signed)
Addended by: Marlene Lard on: 12/25/2021 09:03 AM ? ? Modules accepted: Orders ? ?

## 2021-12-25 NOTE — Telephone Encounter (Addendum)
Left message for patient to call back ? ? ? ? ? ?----- Message from Loel Dubonnet, NP sent at 12/24/2021  4:58 PM EDT ----- ?Normal kidneys, liver, electrolytes. CBC with no evidence of anemia nor infection.  D-dimer with no evidence of blood clot. Good result! ? ? ?Loel Dubonnet, NP  Stephen Stabs, RN ?Normal thyroid function.  Lipid panel shows LDL of 118 which is above goal of less than 70.  Please ensure taking atorvastatin 80 mg daily.  Recommend referral to lipid clinic.  ? ? ?Ask about PREP program...... ?

## 2021-12-25 NOTE — Telephone Encounter (Signed)
Patient's wife called to inquire about the [employer] exercise program Luther Parody told them about.  She wants to know if it's a physician referred program or can they just go to the [facility] themselves and ask for it.  ?

## 2021-12-25 NOTE — Telephone Encounter (Signed)
Pt c/o medication issue: ? ?1. Name of Medication: metoprolol succinate (TOPROL XL) 25 MG 24 hr tablet ? ?2. How are you currently taking this medication (dosage and times per day)? Take 1 tablet (25 mg total) by mouth daily. ? ?3. Are you having a reaction (difficulty breathing--STAT)?  ? ?4. What is your medication issue? Wife called saying that it was increase to 50mg  yesterday at his visit.  He will need a 50mg  script sent to [facility] - [place],  - [address]  ?

## 2021-12-28 ENCOUNTER — Encounter (HOSPITAL_BASED_OUTPATIENT_CLINIC_OR_DEPARTMENT_OTHER): Payer: Self-pay

## 2021-12-31 ENCOUNTER — Ambulatory Visit (HOSPITAL_COMMUNITY)
Admission: RE | Admit: 2021-12-31 | Payer: Managed Care, Other (non HMO) | Source: Ambulatory Visit | Attending: Family | Admitting: Family

## 2022-01-01 ENCOUNTER — Ambulatory Visit (HOSPITAL_COMMUNITY): Payer: Managed Care, Other (non HMO)

## 2022-01-05 ENCOUNTER — Telehealth (HOSPITAL_COMMUNITY): Payer: Self-pay | Admitting: *Deleted

## 2022-01-05 NOTE — Telephone Encounter (Signed)
Close encounter 

## 2022-01-06 ENCOUNTER — Ambulatory Visit (HOSPITAL_COMMUNITY)
Admission: RE | Admit: 2022-01-06 | Discharge: 2022-01-06 | Disposition: A | Discharge status: Home / Self Care | Payer: Managed Care, Other (non HMO) | Source: Ambulatory Visit | Attending: Cardiology | Admitting: Cardiology

## 2022-01-06 DIAGNOSIS — R079 Chest pain, unspecified: Secondary | ICD-10-CM | POA: Diagnosis present

## 2022-01-06 MED ORDER — TECHNETIUM TC 99M TETROFOSMIN IV KIT
28.0000 | PACK | Freq: Once | INTRAVENOUS | Status: AC | PRN
  Administered 2022-01-06: 28 via INTRAVENOUS
  Filled 2022-01-06: qty 28

## 2022-01-06 MED ORDER — REGADENOSON 0.4 MG/5ML IV SOLN
0.4000 mg | Freq: Once | INTRAVENOUS | Status: AC
  Administered 2022-01-06: 0.4 mg via INTRAVENOUS

## 2022-01-07 ENCOUNTER — Encounter (HOSPITAL_BASED_OUTPATIENT_CLINIC_OR_DEPARTMENT_OTHER): Payer: Self-pay

## 2022-01-07 ENCOUNTER — Ambulatory Visit (HOSPITAL_COMMUNITY)
Admission: RE | Admit: 2022-01-07 | Discharge: 2022-01-07 | Disposition: A | Discharge status: Home / Self Care | Payer: Managed Care, Other (non HMO) | Source: Ambulatory Visit | Attending: Internal Medicine | Admitting: Internal Medicine

## 2022-01-07 LAB — MYOCARDIAL PERFUSION IMAGING
LV dias vol: 105 mL (ref 62–150)
LV sys vol: 48 mL
Nuc Stress EF: 54 %
Peak HR: 85 {beats}/min
Rest HR: 65 {beats}/min
SDS: 0
SRS: 0
SSS: 0
ST Depression (mm): 0 mm
Stress Nuclear Isotope Dose: 28 mCi
TID: 1.21

## 2022-01-07 MED ORDER — TECHNETIUM TC 99M TETROFOSMIN IV KIT
31.2000 | PACK | Freq: Once | INTRAVENOUS | Status: AC | PRN
  Administered 2022-01-07: 31.2 via INTRAVENOUS

## 2022-01-12 ENCOUNTER — Other Ambulatory Visit: Payer: Self-pay

## 2022-01-12 MED ORDER — CHLORTHALIDONE 25 MG PO TABS: 25.0000 mg | ORAL_TABLET | Freq: Every day | ORAL | 3 refills | Status: DC

## 2022-01-19 ENCOUNTER — Encounter (HOSPITAL_BASED_OUTPATIENT_CLINIC_OR_DEPARTMENT_OTHER): Payer: Self-pay

## 2022-01-26 DIAGNOSIS — G4730 Sleep apnea, unspecified: Secondary | ICD-10-CM

## 2022-03-06 NOTE — Progress Notes (Deleted)
Office Visit    Patient Name: Stephen Scott Date of Encounter: 03/06/2022  PCP:  Caren Macadam, [place]  Cardiologist:  Kenzo Ozment Martinique, MD  Advanced Practice Provider:  No care team member to display Electrophysiologist:  None    Chief Complaint    Stephen Scott is a 61 y.o. male with a hx of CAD (02/2020 PCI/DES to Cx), hyperlipidemia, paroxysmal atrial fibrillation, hypertension who is seen for follow up   Past Medical History    Past Medical History:  Diagnosis Date   Anemia    as a small child   Anxiety    Arthritis    "lower spine; knees" (11/01/2017)   Chronic lower back pain    Depression    GERD (gastroesophageal reflux disease)    Headache    High cholesterol    History of kidney stones    Hypertension    Pneumonia 06/2017; 08/2017   walking pneumonia; treated   Spinal stenosis    "lower back" (11/01/2017)   Past Surgical History:  Procedure Laterality Date   CORONARY STENT INTERVENTION N/A 03/04/2020   Procedure: CORONARY STENT INTERVENTION;  Surgeon: Martinique, Tekisha Darcey M, MD;  Location: [place] [facility];  Service: Cardiovascular;  Laterality: N/A;   EYE SURGERY Bilateral 2018   Cataracts removed by Dr. Gershon Crane   FOOT SURGERY Left 04/2021   HAND RECONSTRUCTION Left 1989   "injured when washing dishes"   INTRAVASCULAR PRESSURE WIRE/FFR STUDY N/A 03/04/2020   Procedure: INTRAVASCULAR PRESSURE WIRE/FFR STUDY;  Surgeon: Martinique, Brittanyann Wittner M, MD;  Location: [place] [facility];  Service: Cardiovascular;  Laterality: N/A;   INTRAVASCULAR ULTRASOUND/IVUS N/A 03/04/2020   Procedure: Intravascular Ultrasound/IVUS;  Surgeon: Martinique, Hristopher Missildine M, MD;  Location: [place] [facility];  Service: Cardiovascular;  Laterality: N/A;   JOINT REPLACEMENT     KNEE ARTHROSCOPY Right 2008   LEFT HEART CATH AND CORONARY ANGIOGRAPHY N/A 03/04/2020   Procedure: LEFT HEART CATH AND CORONARY ANGIOGRAPHY;  Surgeon: Martinique, Danetra Glock M, MD;  Location: [place] [facility];   Service: Cardiovascular;  Laterality: N/A;   LUMBAR LAMINECTOMY/DECOMPRESSION MICRODISCECTOMY Bilateral 08/24/2021   Procedure: LAMINECTOMY, FORAMINOTOMY L34;  Surgeon: Kary Kos, MD;  Location: [place];  Service: Neurosurgery;  Laterality: Bilateral;  3C   TOTAL KNEE ARTHROPLASTY Right 11/01/2017   TOTAL KNEE ARTHROPLASTY Right 11/01/2017   Procedure: RIGHT TOTAL KNEE ARTHROPLASTY;  Surgeon: Mcarthur Rossetti, MD;  Location: [place];  Service: Orthopedics;  Laterality: Right;   WISDOM TOOTH EXTRACTION  1981    Allergies  Allergies  Allergen Reactions   Iodides Anaphylaxis, Hives and Other (See Comments)    Immediate reaction, looks like chicken pox   Tetanus Toxoids Other (See Comments)    Swelling and tenderness in arm > Local reaction. Older version of tetanus shot, tolerates the newer version     History of Present Illness    Stephen Scott is a 61 y.o. male with a hx of CAD (02/2020 PCI/DES to Cx), hyperlipidemia, paroxysmal atrial fibrillation, hypertension    Chlorthalidone previously initiated and uptitrated for elevated BP. He has had significant weight with diet and exercise changes. When seen 05/2021 he noted increased dyspnea, lack of sleep. Echo performed echocardiogram performed revealing LVEF 60 to 94%, grade 1 diastolic dysfunction, mildly dilated atria, no significant valvular abnormalities.  Cardiac event monitor showed sinus rhythm with paroxysmal atrial fibrillation.  He was started on metoprolol and apixaban.  He had a laminectomy late November 2022. He  later noted episodes of atrial fibrillation have gotten worse and more frequent. Notes may be triggered by more recent stress with new job. Has been trying to reduce alcohol and caffeine intake to prevent. Palpitations feel like heart is pounding and an almost burning like sensation in his neck as well as some jaw pain. Feels somewhat similar to his anginal equivalent. He took 2 nitroglycerin on Sunday with some relief.  He has not taken any extra Metoprolol. He has been taking Eliquis only once per day as he was unaware to take twice per day. Notes his depression has been more notable recently and recently was off his Effexor for 1 month as he did not pick up refill, resumed 2 days ago. He was seen by Laurann Montana NP in March. Myoview was ordered and was normal. Metoprolol dose was increased for palpitations. Considered referral to EP is arrhythmia did not improve.   EKGs/Labs/Other Studies Reviewed:   The following studies were reviewed today: Cardiac catheterization 03/04/2020   Prox Cx to Mid Cx lesion is 80% stenosed. Post intervention, there is a 0% residual stenosis. A drug-eluting stent was successfully placed using a STENT RESOLUTE ONYX 3.5X22. The left ventricular systolic function is normal. LV end diastolic pressure is normal. The left ventricular ejection fraction is 55-65% by visual estimate.   1. Single vessel obstructive CAD involving the LCx. 2. Normal LV function 3. Normal LVEDP 4. Successful PCI of the LCx using FFR and IVUS guidance with DES x 1.   Plan: anticipate same day DC. DAPT with ASA and Brilinta x 1 year.    Diagnostic Dominance: Right Intervention     Echocardiogram 06/30/2021 IMPRESSIONS     1. Left ventricular ejection fraction, by estimation, is 60 to 65%. The  left ventricle has normal function. The left ventricle has no regional  wall motion abnormalities. Left ventricular diastolic parameters are  consistent with Grade I diastolic  dysfunction (impaired relaxation).   2. Right ventricular systolic function is normal. The right ventricular  size is normal. Tricuspid regurgitation signal is inadequate for assessing  PA pressure.   3. Left atrial size was mildly dilated.   4. The mitral valve is normal in structure. No evidence of mitral valve  regurgitation. No evidence of mitral stenosis.   5. The aortic valve is normal in structure. Aortic valve  regurgitation is  not visualized. No aortic stenosis is present.   6. The inferior vena cava is normal in size with greater than 50%  respiratory variability, suggesting right atrial pressure of 3 mmHg.   Comparison(s): No prior Echocardiogram.    Cardiac event monitor 07/07/2021 Normal sinus rhythm Paroxysmal Atrial fibrillation. burden of 25%. rate 51-191 with average of 98 bpm. one episode of aberrancy.     Patch Wear Time:  5 days and 8 hours (2022-09-28T10:09:29-0400 to 2022-10-03T18:51:11-398)   Patient had a min HR of 40 bpm, max HR of 197 bpm, and avg HR of 88 bpm. Predominant underlying rhythm was Sinus Rhythm. 1 run of Ventricular Tachycardia occurred lasting 7 beats with a max rate of 197 bpm (avg 174 bpm). Atrial Fibrillation occurred (25%  burden), ranging from 51-191 bpm (avg of 98 bpm), the longest lasting 1 hour 38 mins with an avg rate of 98 bpm. Isolated SVEs were rare (<1.0%, 1307), SVE Couplets were rare (<1.0%, 28), and SVE Triplets were rare (<1.0%, 5). Isolated VEs were rare  (<1.0%, 860), VE Couplets were rare (<1.0%, 1), and VE Triplets were rare (<1.0%, 1).  Ventricular Bigeminy and Trigeminy were present.    Myoview 01/07/22: Study Highlights      The study is normal. The study is low risk.   No ST deviation was noted.   Left ventricular function is normal. End diastolic cavity size is normal.   Prior study not available for comparison.   Findings: Negative for stress induced arrhythmias. No evidence of ischemia or infarction.   Conclusions: Stress test is negative. Low risk study.     EKG:  EKG is  ordered today.  The ekg ordered today demonstrates SR 70 bpm   Recent Labs: 12/24/2021: ALT 28; BUN 15; Creatinine, Ser 0.83; Hemoglobin 15.3; Platelets 242; Potassium 3.8; Sodium 140; TSH 2.950  Recent Lipid Panel    Component Value Date/Time   CHOL 183 12/24/2021 0939   TRIG 120 12/24/2021 0939   HDL 43 12/24/2021 0939   CHOLHDL 4.3 12/24/2021 0939    LDLCALC 118 (H) 12/24/2021 0939    Risk Assessment/Calculations:   CHA2DS2-VASc Score = 2   This indicates a 2.2% annual risk of stroke. The patient's score is based upon: CHF History: 0 HTN History: 1 Diabetes History: 0 Stroke History: 0 Vascular Disease History: 1 Age Score: 0 Gender Score: 0   Home Medications   No outpatient medications have been marked as taking for the 03/10/22 encounter (Appointment) with Martinique, Mikela Senn M, MD.    Review of Systems      All other systems reviewed and are otherwise negative except as noted above.  Physical Exam    VS:  There were no vitals taken for this visit. , BMI There is no height or weight on file to calculate BMI.  Wt Readings from Last 3 Encounters:  01/06/22 270 lb (122.5 kg)  12/24/21 270 lb 6.4 oz (122.7 kg)  08/24/21 265 lb (120.2 kg)    GEN: Well nourished, overweight, well developed, in no acute distress. HEENT: normal. Neck: Supple, no JVD, carotid bruits, or masses. Cardiac: RRR, no murmurs, rubs, or gallops. No clubbing, cyanosis, edema.  Radials/PT 2+ and equal bilaterally.  Respiratory:  Respirations regular and unlabored, clear to auscultation bilaterally. GI: Soft, nontender, nondistended. MS: No deformity or atrophy. Skin: Warm and dry, no rash. Neuro:  Strength and sensation are intact. Psych: Normal affect.  Assessment & Plan    PAF / Hypercoagulable state - increasing palpitations over the past fe wmonths. EKG today NSR. Educated to increase hydration, avoid caffeine/alcohol. Notes recent stress could be contributory. Echo 06/2021 normal LVEF, gr1DD, no significant valvular abnormalities.  Increase metoprolol succinate to 50 mg daily. Start metoprolol titrate 12.5 mg as needed for breakthrough palpitations. CHA2DS2-VASc Score = 2 [CHF History: 0, HTN History: 1, Diabetes History: 0, Stroke History: 0, Vascular Disease History: 1, Age Score: 0, Gender Score: 0].  Therefore, the patient's annual risk of  stroke is 2.2 %.    He has been taking Eliquis once per day as he misunderstood directions.  Educated to take Eliquis twice per day.  Samples provided in clinic.  Denies bleeding complications.  Given new dyspnea, tachycardia will plan for D-dimer to rule out PE.   Update TSH, CBC, c-Met. If persistent tachycardias, palpitations may consider referral to EP for consideration of ablation.   CAD s/p DES to LCx - Myoview in April was normal. His GDMT includes Metoprolol, Rosuvastatin, PRN nitroglycerin. No aspirin due to anticoagulation.   Snores - STOP Bang score of 6. Snores. Previous sleep study inconclusive as large portion data not interpretable. [name]  home sleep study provided in clinic today due to risk factors including weight, HTN, atrial fibrillation.   HTN - BP well controlled. Continue current antihypertensive regimen.    HLD, LDL goal <70 - continue atorvastatin 80 mg daily.  Denies myalgias.  Update lipid panel today.  Obesity - Weight loss via diet and exercise encouraged. Discussed the impact being overweight would have on cardiovascular risk.  Referred to prep exercise program at the [facility].  He may start pending results of stress test will refer as classes usually start at the beginning of each month and anticipate not starting until May.  Depression - Managed by PCP. Recently resumed Effexor. Offered referral to Dr. Elias Else, PsyD which he will consider.   Shared Decision Making/Informed Consent{ The risks [chest pain, shortness of breath, cardiac arrhythmias, dizziness, blood pressure fluctuations, myocardial infarction, stroke/transient ischemic attack, nausea, vomiting, allergic reaction, radiation exposure, metallic taste sensation and life-threatening complications (estimated to be 1 in 10,000)], benefits (risk stratification, diagnosing coronary artery disease, treatment guidance) and alternatives of a nuclear stress test were discussed in detail with Mr. Wingerter and he  agrees to proceed.   Disposition: Follow up in 4-6 week(s) with Hance Caspers Martinique, MD or APP.  Signed, Iysha Mishkin Martinique, MD 03/06/2022, 10:55 AM [place]

## 2022-03-10 ENCOUNTER — Ambulatory Visit: Payer: Managed Care, Other (non HMO) | Admitting: Cardiology

## 2022-03-15 ENCOUNTER — Ambulatory Visit (INDEPENDENT_AMBULATORY_CARE_PROVIDER_SITE_OTHER): Payer: Managed Care, Other (non HMO)

## 2022-03-15 ENCOUNTER — Ambulatory Visit (INDEPENDENT_AMBULATORY_CARE_PROVIDER_SITE_OTHER): Payer: Managed Care, Other (non HMO) | Admitting: Orthopaedic Surgery

## 2022-03-15 DIAGNOSIS — M25562 Pain in left knee: Secondary | ICD-10-CM

## 2022-03-15 DIAGNOSIS — M25551 Pain in right hip: Secondary | ICD-10-CM | POA: Diagnosis not present

## 2022-03-15 MED ORDER — PREDNISONE 50 MG PO TABS: ORAL_TABLET | ORAL | 0 refills | Status: DC

## 2022-03-15 MED ORDER — METHOCARBAMOL 750 MG PO TABS: 750.0000 mg | ORAL_TABLET | Freq: Four times a day (QID) | ORAL | 1 refills | Status: DC

## 2022-03-15 NOTE — Progress Notes (Signed)
                                                                                                                       Stephen Scott is well-known to me.  He is an active 61 year old gentleman who sustained a mechanical fall on 03/05/2022 he was going up some stairs on a curb and landed on his left knee.  He has had left knee pain and right hip pain since then and some pain in the groin since the fall.  He says he injured his right wrist also when he fell when he caught himself.  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  He was seen in urgent care that had x-rays obtained of his left knee which were negative.  It is reasonable to reobtain x-rays today given the fact that he may have still had a fracture.  He hurts a lot around his left patella and patella tendon.  There is bruising in this area as well.  We have remotely replaced his right knee.  His left knee is examined.  There is no effusion but there is still significant pain and bruising over the patella tendon area.  His extensor mechanism is intact.  He does have some pain to the lateral aspect of his knee and posteriorly but the knee feels ligamentously stable.  His right hip has good internal extra rotation with some pain over the trochanteric area but no blocks to rotation and no significant pain in the groin at all.  An AP and lateral of the left knee shows no acute findings.  The joint space is well-maintained.  There is no periosteal reaction to stress fracture.  A sunrise view also shows a normal appearing patella.  An AP pelvis and lateral the right hip shows normal-appearing right and left hip with no cortical irregularities around the trochanteric area either.  Hopefully this will continue to improve with time.  He cannot take anti-inflammatories because he is on blood thinning medication.  I  will try 5 days of prednisone 50 mg combined with 750 mg gram methocarbamol.  I can see him back in 4 weeks to see how he is doing overall.  If he is asymptomatic he can cancel that appointment.

## 2022-03-17 ENCOUNTER — Encounter: Payer: Self-pay | Admitting: Cardiology

## 2022-04-14 ENCOUNTER — Ambulatory Visit: Payer: Managed Care, Other (non HMO) | Admitting: Orthopaedic Surgery

## 2022-05-05 ENCOUNTER — Ambulatory Visit (HOSPITAL_COMMUNITY)
Admission: RE | Admit: 2022-05-05 | Discharge: 2022-05-05 | Disposition: A | Discharge status: Home / Self Care | Payer: Managed Care, Other (non HMO) | Source: Ambulatory Visit | Attending: Physician Assistant | Admitting: Physician Assistant

## 2022-05-05 ENCOUNTER — Encounter (HOSPITAL_COMMUNITY): Payer: Self-pay | Admitting: Physician Assistant

## 2022-05-05 VITALS — BP 136/82 | HR 68 | Ht 71.0 in | Wt 270.6 lb

## 2022-05-05 DIAGNOSIS — I480 Paroxysmal atrial fibrillation: Secondary | ICD-10-CM | POA: Diagnosis present

## 2022-05-05 DIAGNOSIS — D6869 Other thrombophilia: Secondary | ICD-10-CM | POA: Insufficient documentation

## 2022-05-05 DIAGNOSIS — F419 Anxiety disorder, unspecified: Secondary | ICD-10-CM | POA: Diagnosis not present

## 2022-05-05 DIAGNOSIS — R0683 Snoring: Secondary | ICD-10-CM | POA: Diagnosis not present

## 2022-05-05 DIAGNOSIS — I2510 Atherosclerotic heart disease of native coronary artery without angina pectoris: Secondary | ICD-10-CM | POA: Insufficient documentation

## 2022-05-05 DIAGNOSIS — Z79899 Other long term (current) drug therapy: Secondary | ICD-10-CM | POA: Diagnosis not present

## 2022-05-05 DIAGNOSIS — I10 Essential (primary) hypertension: Secondary | ICD-10-CM | POA: Insufficient documentation

## 2022-05-05 DIAGNOSIS — R7303 Prediabetes: Secondary | ICD-10-CM | POA: Diagnosis not present

## 2022-05-05 DIAGNOSIS — F32A Depression, unspecified: Secondary | ICD-10-CM | POA: Diagnosis not present

## 2022-05-05 DIAGNOSIS — E785 Hyperlipidemia, unspecified: Secondary | ICD-10-CM | POA: Insufficient documentation

## 2022-05-05 DIAGNOSIS — Z6837 Body mass index (BMI) 37.0-37.9, adult: Secondary | ICD-10-CM | POA: Diagnosis not present

## 2022-05-05 DIAGNOSIS — Z7901 Long term (current) use of anticoagulants: Secondary | ICD-10-CM | POA: Insufficient documentation

## 2022-05-05 DIAGNOSIS — E669 Obesity, unspecified: Secondary | ICD-10-CM | POA: Insufficient documentation

## 2022-05-05 MED ORDER — RIVAROXABAN 20 MG PO TABS: 20.0000 mg | ORAL_TABLET | Freq: Every day | ORAL | 4 refills | Status: DC

## 2022-05-05 MED ORDER — METOPROLOL SUCCINATE ER 50 MG PO TB24: 75.0000 mg | ORAL_TABLET | Freq: Every day | ORAL | 3 refills | Status: DC

## 2022-05-05 NOTE — Progress Notes (Signed)
Primary Care Physician: Caren Macadam, MD Primary Cardiologist: Dr Martinique Primary Electrophysiologist: none Referring Physician: Dr Rudy Jew is a 61 y.o. male with a history of CAD, HLD, HTN, prediabetes, atrial fibrillation who presents for consultation in the [facility].  The patient was initially diagnosed with atrial fibrillation on an event monitor in 2022. Patient is on Eliquis for a CHADS2VASC score of 2. He reports that his afib episodes have become more frequent with symptoms of "heart pounding", "restlessness", and fatigue. He has a kit for a home sleep study but has not used it yet. He admits that he frequently forgets to take his second dose of Eliquis.   Today, he denies symptoms of chest pain, shortness of breath, orthopnea, PND, lower extremity edema, dizziness, presyncope, syncope, bleeding, or neurologic sequela. The patient is tolerating medications without difficulties and is otherwise without complaint today.    Atrial Fibrillation Risk Factors:  he does have symptoms or diagnosis of sleep apnea. He has a home sleep study kit. he does not have a history of rheumatic fever. he does have a history of alcohol use. The patient does not have a history of early familial atrial fibrillation or other arrhythmias.  he has a BMI of Body mass index is 37.74 kg/m.Marland Kitchen Filed Weights   05/05/22 0946  Weight: 122.7 kg    Family History  Problem Relation Age of Onset   Clotting disorder Mother    Pulmonary embolism Mother    Diabetes Mother    Hyperlipidemia Mother    Hypertension Mother    Heart disease Brother        stent   Heart attack Brother      Atrial Fibrillation Management history:  Previous antiarrhythmic drugs: none Previous cardioversions: none Previous ablations: none CHADS2VASC score: 2 Anticoagulation history: Eliquis   Past Medical History:  Diagnosis Date   Anemia    as a small child   Anxiety     Arthritis    "lower spine; knees" (11/01/2017)   Chronic lower back pain    Depression    GERD (gastroesophageal reflux disease)    Headache    High cholesterol    History of kidney stones    Hypertension    Pneumonia 06/2017; 08/2017   walking pneumonia; treated   Spinal stenosis    "lower back" (11/01/2017)   Past Surgical History:  Procedure Laterality Date   CORONARY STENT INTERVENTION N/A 03/04/2020   Procedure: CORONARY STENT INTERVENTION;  Surgeon: Martinique, Peter M, MD;  Location: [facility];  Service: Cardiovascular;  Laterality: N/A;   EYE SURGERY Bilateral 2018   Cataracts removed by Dr. Gershon Crane   FOOT SURGERY Left 04/2021   HAND RECONSTRUCTION Left 1989   "injured when washing dishes"   INTRAVASCULAR PRESSURE WIRE/FFR STUDY N/A 03/04/2020   Procedure: INTRAVASCULAR PRESSURE WIRE/FFR STUDY;  Surgeon: Martinique, Peter M, MD;  Location: [facility];  Service: Cardiovascular;  Laterality: N/A;   INTRAVASCULAR ULTRASOUND/IVUS N/A 03/04/2020   Procedure: Intravascular Ultrasound/IVUS;  Surgeon: Martinique, Peter M, MD;  Location: [facility];  Service: Cardiovascular;  Laterality: N/A;   JOINT REPLACEMENT     KNEE ARTHROSCOPY Right 2008   LEFT HEART CATH AND CORONARY ANGIOGRAPHY N/A 03/04/2020   Procedure: LEFT HEART CATH AND CORONARY ANGIOGRAPHY;  Surgeon: Martinique, Peter M, MD;  Location: [facility];  Service: Cardiovascular;  Laterality: N/A;   LUMBAR LAMINECTOMY/DECOMPRESSION MICRODISCECTOMY Bilateral 08/24/2021   Procedure:  LAMINECTOMY, FORAMINOTOMY L34;  Surgeon: Kary Kos, MD;  Location: [facility];  Service: Neurosurgery;  Laterality: Bilateral;  3C   TOTAL KNEE ARTHROPLASTY Right 11/01/2017   TOTAL KNEE ARTHROPLASTY Right 11/01/2017   Procedure: RIGHT TOTAL KNEE ARTHROPLASTY;  Surgeon: Mcarthur Rossetti, MD;  Location: [facility];  Service: Orthopedics;  Laterality: Right;   WISDOM TOOTH EXTRACTION  1981    Current Outpatient Medications  Medication  Sig Dispense Refill   acetaminophen (TYLENOL) 500 MG tablet Take 500-1,000 mg by mouth every 6 (six) hours as needed for moderate pain.     atorvastatin (LIPITOR) 80 MG tablet Take 1 tablet (80 mg total) by mouth daily. 90 tablet 3   chlorthalidone (HYGROTON) 25 MG tablet Take 1 tablet (25 mg total) by mouth daily. 90 tablet 3   CONTOUR NEXT TEST test strip as directed.     gabapentin (NEURONTIN) 300 MG capsule Take 300 mg by mouth daily as needed (pain).     loratadine (CLARITIN) 10 MG tablet Take 10 mg by mouth daily as needed for allergies.      losartan (COZAAR) 100 MG tablet Take 100 mg by mouth daily.     metoprolol tartrate (LOPRESSOR) 25 MG tablet Take 0.5 tablets (12.5 mg total) by mouth as needed (for palpitations). 30 tablet 2   nitroGLYCERIN (NITROSTAT) 0.4 MG SL tablet Place 1 tablet (0.4 mg total) under the tongue every 5 (five) minutes as needed for chest pain. 90 tablet 3   Polyethyl Glycol-Propyl Glycol (SYSTANE OP) Place 1 drop into both eyes daily as needed (dry eyes).     rivaroxaban (XARELTO) 20 MG TABS tablet Take 1 tablet (20 mg total) by mouth daily with supper. 30 tablet 4   venlafaxine XR (EFFEXOR-XR) 150 MG 24 hr capsule Take 150 mg by mouth daily with breakfast.     metoprolol succinate (TOPROL XL) 50 MG 24 hr tablet Take 1.5 tablets (75 mg total) by mouth daily. 120 tablet 3   No current facility-administered medications for this encounter.    Allergies  Allergen Reactions   Iodides Anaphylaxis, Hives and Other (See Comments)    Immediate reaction, looks like chicken pox   Tetanus Toxoids Other (See Comments)    Swelling and tenderness in arm > Local reaction. Older version of tetanus shot, tolerates the newer version     Social History   Socioeconomic History   Marital status: Married    Spouse name: Not on file   Number of children: 2   Years of education: Not on file   Highest education level: Not on file  Occupational History    Employer: [employer]  Tobacco Use   Smoking status: Never   Smokeless tobacco: Never   Tobacco comments:    Never smoke 05/05/22  Vaping Use   Vaping Use: Never used  Substance and Sexual Activity   Alcohol use: Not Currently    Alcohol/week: 1.0 - 2.0 standard drink of alcohol    Types: 1 - 2 Standard drinks or equivalent per week    Comment: nothing in a week 05/05/22   Drug use: No   Sexual activity: Not on file  Other Topics Concern   Not on file  Social History Narrative   Not on file   Social Determinants of Health   Financial Resource Strain: Not on file  Food Insecurity: Not on file  Transportation Needs: Not on file  Physical Activity: Not on file  Stress: Not on  file  Social Connections: Not on file  Intimate Partner Violence: Not on file     ROS- All systems are reviewed and negative except as per the HPI above.  Physical Exam: Vitals:   05/05/22 0946  BP: 136/82  Pulse: 68  Weight: 122.7 kg  Height: _0  (1.803 m)    GEN- The patient is a well appearing obese male, alert and oriented x 3 today.   Head- normocephalic, atraumatic Eyes-  Sclera clear, conjunctiva pink Ears- hearing intact Oropharynx- clear Neck- supple  Lungs- Clear to ausculation bilaterally, normal work of breathing Heart- Regular rate and rhythm, no murmurs, rubs or gallops  GI- soft, NT, ND, + BS Extremities- no clubbing, cyanosis, or edema MS- no significant deformity or atrophy Skin- no rash or lesion Psych- euthymic mood, full affect Neuro- strength and sensation are intact  Wt Readings from Last 3 Encounters:  05/05/22 122.7 kg  01/06/22 122.5 kg  12/24/21 122.7 kg    EKG today demonstrates  SR Vent. rate 68 BPM PR interval 164 ms QRS duration 92 ms QT/QTcB 414/440 ms  Echo 06/30/21 demonstrated   1. Left ventricular ejection fraction, by estimation, is 60 to 65%. The  left ventricle has normal function. The left ventricle has no regional  wall motion  abnormalities. Left ventricular diastolic parameters are  consistent with Grade I diastolic dysfunction (impaired relaxation).   2. Right ventricular systolic function is normal. The right ventricular  size is normal. Tricuspid regurgitation signal is inadequate for assessing PA pressure.   3. Left atrial size was mildly dilated.   4. The mitral valve is normal in structure. No evidence of mitral valve  regurgitation. No evidence of mitral stenosis.   5. The aortic valve is normal in structure. Aortic valve regurgitation is  not visualized. No aortic stenosis is present.   6. The inferior vena cava is normal in size with greater than 50%  respiratory variability, suggesting right atrial pressure of 3 mmHg  Epic records are reviewed at length today  CHA2DS2-VASc Score = 2  The patient's score is based upon: CHF History: 0 HTN History: 1 Diabetes History: 0 (prediabetes) Stroke History: 0 Vascular Disease History: 1 Age Score: 0 Gender Score: 0       ASSESSMENT AND PLAN: 1. Paroxysmal Atrial Fibrillation (ICD10:  I48.0) The patient's CHA2DS2-VASc score is 2, indicating a 2.2% annual risk of stroke.   Patient in [place] today. We discussed rhythm control options. Will increase Toprol to 75 mg daily Will change Eliquis to Xarelto 20 mg for ease of dosing He will need to be consistent taking his medications to consider AAD.  2. Secondary Hypercoagulable State (ICD10:  D68.69) The patient is at significant risk for stroke/thromboembolism based upon his CHA2DS2-VASc Score of 2.  Start Rivaroxaban (Xarelto).   3. Obesity Body mass index is 37.74 kg/m. Lifestyle modification was discussed at length including regular exercise and weight reduction.  4. Snoring/suspected obstructive sleep apnea The importance of adequate treatment of sleep apnea was discussed today in order to improve our ability to maintain sinus rhythm long term. Strongly encouraged patient to complete the sleep  study.  5. Anxiety/depression Patient has h/o depression and has a lot of anxiety about his health and his family dynamics surrounding his health. Patient agreeable to referral to Dr Michail Sermon.    Follow up in the AF clinic in 3 weeks.    [facility] [address] [place], [place] [address]  [phone] 05/05/2022 12:20 PM

## 2022-05-05 NOTE — Patient Instructions (Signed)
Stop eliquis  Start Xarelto 20mg  once a day WITH SUPPER  Increase metoprolol to 75mg  once a day (1 and 1/2 of your 50mg  tablets)

## 2022-05-27 ENCOUNTER — Encounter (HOSPITAL_COMMUNITY): Payer: Self-pay

## 2022-05-27 ENCOUNTER — Ambulatory Visit (HOSPITAL_COMMUNITY): Payer: Managed Care, Other (non HMO) | Admitting: Physician Assistant

## 2022-07-15 ENCOUNTER — Other Ambulatory Visit: Payer: Self-pay | Admitting: Cardiology

## 2022-11-13 ENCOUNTER — Emergency Department (HOSPITAL_COMMUNITY): Payer: Managed Care, Other (non HMO)

## 2022-11-13 ENCOUNTER — Emergency Department (HOSPITAL_COMMUNITY)
Admission: EM | Admit: 2022-11-13 | Discharge: 2022-11-13 | Disposition: A | Discharge status: Home / Self Care | Payer: Managed Care, Other (non HMO) | Attending: Emergency Medicine | Admitting: Emergency Medicine

## 2022-11-13 ENCOUNTER — Other Ambulatory Visit: Payer: Self-pay

## 2022-11-13 DIAGNOSIS — I10 Essential (primary) hypertension: Secondary | ICD-10-CM | POA: Diagnosis not present

## 2022-11-13 DIAGNOSIS — Z7901 Long term (current) use of anticoagulants: Secondary | ICD-10-CM | POA: Insufficient documentation

## 2022-11-13 DIAGNOSIS — Z79899 Other long term (current) drug therapy: Secondary | ICD-10-CM | POA: Insufficient documentation

## 2022-11-13 DIAGNOSIS — I4891 Unspecified atrial fibrillation: Secondary | ICD-10-CM | POA: Diagnosis present

## 2022-11-13 LAB — CBC
HCT: 46 % (ref 39.0–52.0)
Hemoglobin: 15.1 g/dL (ref 13.0–17.0)
MCH: 29.4 pg (ref 26.0–34.0)
MCHC: 32.8 g/dL (ref 30.0–36.0)
MCV: 89.5 fL (ref 80.0–100.0)
Platelets: 243 10*3/uL (ref 150–400)
RBC: 5.14 MIL/uL (ref 4.22–5.81)
RDW: 14.7 % (ref 11.5–15.5)
WBC: 12.7 10*3/uL — ABNORMAL HIGH (ref 4.0–10.5)
nRBC: 0 % (ref 0.0–0.2)

## 2022-11-13 LAB — BASIC METABOLIC PANEL
Anion gap: 13 (ref 5–15)
BUN: 17 mg/dL (ref 8–23)
CO2: 22 mmol/L (ref 22–32)
Calcium: 9 mg/dL (ref 8.9–10.3)
Chloride: 102 mmol/L (ref 98–111)
Creatinine, Ser: 1.09 mg/dL (ref 0.61–1.24)
GFR, Estimated: >60 mL/min (ref >60)
Glucose, Bld: 110 mg/dL — ABNORMAL HIGH (ref 70–99)
Potassium: 3.4 mmol/L — ABNORMAL LOW (ref 3.5–5.1)
Sodium: 137 mmol/L (ref 135–145)

## 2022-11-13 LAB — PROTIME-INR
INR: 2.3 — ABNORMAL HIGH (ref 0.8–1.2)
Prothrombin Time: 24.8 seconds — ABNORMAL HIGH (ref 11.4–15.2)

## 2022-11-13 LAB — TROPONIN I (HIGH SENSITIVITY): Troponin I (High Sensitivity): 9 ng/L (ref <18)

## 2022-11-13 MED ORDER — PREDNISONE 20 MG PO TABS
60.0000 mg | ORAL_TABLET | Freq: Once | ORAL | Status: AC
  Administered 2022-11-13: 60 mg via ORAL
  Filled 2022-11-13: qty 3

## 2022-11-13 MED ORDER — METOPROLOL TARTRATE 5 MG/5ML IV SOLN
5.0000 mg | Freq: Once | INTRAVENOUS | Status: AC
  Administered 2022-11-13: 5 mg via INTRAVENOUS
  Filled 2022-11-13: qty 5

## 2022-11-13 MED ORDER — PREDNISONE 20 MG PO TABS: 40.0000 mg | ORAL_TABLET | Freq: Every day | ORAL | 0 refills | Status: DC

## 2022-11-13 MED ORDER — COLCHICINE 0.6 MG PO TABS: 0.6000 mg | ORAL_TABLET | Freq: Two times a day (BID) | ORAL | 0 refills | Status: DC

## 2022-11-13 MED ORDER — OXYCODONE-ACETAMINOPHEN 5-325 MG PO TABS
2.0000 | ORAL_TABLET | Freq: Once | ORAL | Status: AC
  Administered 2022-11-13: 2 via ORAL
  Filled 2022-11-13: qty 2

## 2022-11-13 NOTE — ED Triage Notes (Signed)
Patient sent to ED from walk-in clinic for evaluation of shortness of breath, patient states he had an EKG today that showed afib RVR with rate of 160. Patient denies missing any doses of his medications including xarelto. Patient is alert, oriented, and in no apparent distress at this time.

## 2022-11-13 NOTE — Discharge Instructions (Signed)
Your testing today was reassuring and did not show any signs of heart attack.  It did show that your heart rate was significantly elevated.  This occurs when you have atrial fibrillation or atrial flutter which is what you had.  Your heart rate has come down appropriately with the medications and I did discuss your care with the cardiologist.  They would like for you to start taking metoprolol 75 mg/day instead of 50 mg and make sure that you are taking your Xarelto every day.  They want you to follow-up in the atrial fibrillation clinic.  Please make the call tomorrow morning to make this arrangements.  ER for severe or worsening symptoms

## 2022-11-13 NOTE — ED Provider Notes (Signed)
[facility] Provider Note   CSN: [id number] Arrival date & time: 11/13/22  1100     History  Chief Complaint  Patient presents with   Atrial Fibrillation    Zyheem Traywick is a 62 y.o. male.   Atrial Fibrillation   This patient is a 62 year old male, he has a history of atrial fibrillation, history of gout, history of heart disease status post stenting, left heart cath done in 2021, echocardiogram done October 2022 showing ejection fraction of 60 to 65% with grade 1 diastolic dysfunction.  He is on daily Xarelto.  He was low seen in the office in August 2023 by the cardiology service for atrial fibrillation clinic.  His primary cardiologist is Dr. Martinique.  The patient had reported having A-fib episodes that were becoming more frequent, he states he is good about taking his metoprolol and his Xarelto and cannot recall missing a dose at any time in the recent past.  It is unclear whether the patient has had ongoing cardiac monitoring which is not seen in the medical record.  He reports that several times a week he feels like his heart is racing but it only last for short time and then it goes away.  He has no chest pain no swelling of the legs, no nausea or vomiting, no fevers or chills and he is not coughing or short of breath.  When he went to have his gout checked today in the clinic he was found to be very tachycardic and felt like this occurred while he was waiting for the visit.    Home Medications Prior to Admission medications   Medication Sig Start Date End Date Taking? Authorizing Provider  acetaminophen (TYLENOL) 500 MG tablet Take 500-1,000 mg by mouth every 6 (six) hours as needed for moderate pain.   Yes [provider]  atorvastatin (LIPITOR) 80 MG tablet Take 1 tablet (80 mg total) by mouth daily. 12/25/21  Yes Loel Dubonnet, NP  chlorthalidone (HYGROTON) 25 MG tablet Take 1 tablet (25 mg total) by mouth daily. 01/12/22  01/07/23 Yes Martinique, Peter M, MD  HYDROcodone-acetaminophen (NORCO/VICODIN) 5-325 MG tablet Take 1 tablet by mouth every 6 (six) hours as needed for moderate pain. 09/19/22  Yes [provider]  loratadine (CLARITIN) 10 MG tablet Take 10 mg by mouth daily as needed for allergies.    Yes [provider]  losartan (COZAAR) 100 MG tablet Take 100 mg by mouth daily.   Yes [provider]  metoprolol succinate (TOPROL XL) 50 MG 24 hr tablet Take 1.5 tablets (75 mg total) by mouth daily. Patient taking differently: Take 50 mg by mouth daily. Patient taking 50 mg daily. 05/05/22  Yes Fenton, Clint R, PA  metoprolol tartrate (LOPRESSOR) 25 MG tablet Take 0.5 tablets (12.5 mg total) by mouth as needed (for palpitations). 12/24/21 11/13/22 Yes Loel Dubonnet, NP  Misc Natural Products (TART CHERRY ADVANCED PO) Take 1 capsule by mouth daily.   Yes [provider]  nitroGLYCERIN (NITROSTAT) 0.4 MG SL tablet Place 1 tablet (0.4 mg total) under the tongue every 5 (five) minutes as needed for chest pain. 12/24/21 11/13/22 Yes Loel Dubonnet, NP  OZEMPIC, 2 MG/DOSE, 8 MG/3ML SOPN Inject 2 mg into the skin once a week. Saturday 09/17/22  Yes [provider]  Polyethyl Glycol-Propyl Glycol (SYSTANE OP) Place 1 drop into both eyes daily as needed (dry eyes).   Yes [provider]  potassium chloride (  KLOR-CON) 10 MEQ tablet Take 10 mEq by mouth daily. 10/19/22  Yes [provider]  rivaroxaban (XARELTO) 20 MG TABS tablet Take 1 tablet (20 mg total) by mouth daily with supper. 05/05/22  Yes Fenton, Clint R, PA  CONTOUR NEXT TEST test strip as directed. 04/21/22   [provider]  venlafaxine XR (EFFEXOR-XR) 150 MG 24 hr capsule Take 150 mg by mouth daily with breakfast. Patient not taking: Reported on 11/13/2022    [provider]      Allergies    Iodides and Tetanus toxoids    Review of Systems   Review of Systems  All other systems  reviewed and are negative.   Physical Exam Updated Vital Signs BP 132/85   Pulse 83   Temp 98.4 F (36.9 C)   Resp 15   SpO2 100%  Physical Exam Vitals and nursing note reviewed.  Constitutional:      General: He is not in acute distress.    Appearance: He is well-developed.  HENT:     Head: Normocephalic and atraumatic.     Mouth/Throat:     Pharynx: No oropharyngeal exudate.  Eyes:     General: No scleral icterus.       Right eye: No discharge.        Left eye: No discharge.     Conjunctiva/sclera: Conjunctivae normal.     Pupils: Pupils are equal, round, and reactive to light.  Neck:     Thyroid: No thyromegaly.     Vascular: No JVD.  Cardiovascular:     Rate and Rhythm: Regular rhythm. Tachycardia present.     Heart sounds: Normal heart sounds. No murmur heard.    No friction rub. No gallop.     Comments: Heart rate of 160 bpm, extremely regular narrow complex Pulmonary:     Effort: Pulmonary effort is normal. No respiratory distress.     Breath sounds: Normal breath sounds. No wheezing or rales.  Abdominal:     General: Bowel sounds are normal. There is no distension.     Palpations: Abdomen is soft. There is no mass.     Tenderness: There is no abdominal tenderness.  Musculoskeletal:        General: Tenderness present. Normal range of motion.     Cervical back: Normal range of motion and neck supple.     Right lower leg: No edema.     Left lower leg: No edema.     Comments: Ankle tenderness but no redness or swelling or fever  Lymphadenopathy:     Cervical: No cervical adenopathy.  Skin:    General: Skin is warm and dry.     Findings: No erythema or rash.  Neurological:     Mental Status: He is alert.     Coordination: Coordination normal.  Psychiatric:        Behavior: Behavior normal.     ED Results / Procedures / Treatments   Labs (all labs ordered are listed, but only abnormal results are displayed) Labs Reviewed  BASIC METABOLIC PANEL -  Abnormal; Notable for the following components:      Result Value   Potassium 3.4 (*)    Glucose, Bld 110 (*)    All other components within normal limits  CBC - Abnormal; Notable for the following components:   WBC 12.7 (*)    All other components within normal limits  PROTIME-INR - Abnormal; Notable for the following components:   Prothrombin Time 24.8 (*)  INR 2.3 (*)    All other components within normal limits  TROPONIN I (HIGH SENSITIVITY)    EKG EKG Interpretation  Date/Time:  Saturday November 13 2022 11:10:43 EST Ventricular Rate:  162 PR Interval:    QRS Duration: 93 QT Interval:  311 QTC Calculation: 511 R Axis:   83 Text Interpretation: regular narrow complex tachycardia Borderline right axis deviation Since last tracing aflutter replaced NSR (8/23) Confirmed by Noemi Chapel [id number]) on 11/13/2022 11:34:18 AM    EKG Interpretation  Date/Time:  Saturday November 13 2022 11:19:53 EST Ventricular Rate:  101 PR Interval:    QRS Duration: 93 QT Interval:  358 QTC Calculation: 476 R Axis:   84 Text Interpretation: Atrial flutter variable block Borderline right axis deviation Nonspecific T abnormalities, inferior leads Minimal ST elevation, inferior leads Borderline prolonged QT interval Confirmed by Noemi Chapel [phone]) on 11/13/2022 11:37:20 AM         Radiology DG Chest 2 View  Result Date: 11/13/2022 CLINICAL DATA:  Atrial fibrillation. EXAM: CHEST - 2 VIEW COMPARISON:  None Available. FINDINGS: The cardiac silhouette, mediastinal and hilar contours are normal. The lungs are clear. No pleural effusions. Pulmonary lesions. No pneumothorax. The bony thorax is intact. IMPRESSION: No acute cardiopulmonary findings. Electronically Signed   By: Marijo Sanes M.D.   On: 11/13/2022 12:39    Procedures Procedures    Medications Ordered in ED Medications  metoprolol tartrate (LOPRESSOR) injection 5 mg (5 mg Intravenous Given 11/13/22 1242)   oxyCODONE-acetaminophen (PERCOCET/ROXICET) 5-325 MG per tablet 2 tablet (2 tablets Oral Given 11/13/22 1303)  predniSONE (DELTASONE) tablet 60 mg (60 mg Oral Given 11/13/22 1303)    ED Course/ Medical Decision Making/ A&P                             Medical Decision Making Amount and/or Complexity of Data Reviewed Labs: ordered. Radiology: ordered.  Risk Prescription drug management.   This patient presents to the ED for concern of atrial fibrillation and palpitations, this involves an extensive number of treatment options, and is a complaint that carries with it a high risk of complications and morbidity.  The differential diagnosis includes intermittent A-fib, he could be in chronic A-fib with intermittent tachycardia or he could be in sinus rhythm with intermittent paroxysmal A-fib.  He is currently in atrial flutter with 2-1 block, in front of me on the monitor the patient went into a variable block and then into a heart rate of around 100 bpm with variable block of either 3-1 or 4-1.  Could be ischemic given his history of ischemia, otherwise the patient has had no symptoms recently   Co morbidities that complicate the patient evaluation  Hypertension, known heart disease, known A-fib   Additional history obtained:  Additional history obtained from [facility] record External records from outside source obtained and reviewed including prior heart cath echo cardiogram and cardiology office visit notes from A-fib clinic   Lab Tests:  I Ordered, and personally interpreted labs.  The pertinent results include: CBC with mild leukocytosis of [id number], metabolic panel unremarkable, troponin is 9, chest x-ray without acute findings   Imaging Studies ordered:  I ordered imaging studies including chest x-ray I independently visualized and interpreted imaging which showed no acute findings I agree with the radiologist interpretation   Cardiac Monitoring: / EKG:  The patient  was maintained on a cardiac monitor.  I personally viewed and interpreted the cardiac  monitored which showed an underlying rhythm of: Fibrillation or flutter with RVR, improved to rate controlled atrial flutter   Consultations Obtained:  I requested consultation with the Dr. Stanford Breed with cardiology,  and discussed lab and imaging findings as well as pertinent plan - they recommend: Discharge home and follow-up with atrial fibrillation clinic, up to 75 mg of metoprolol instead of 50 which the patient endorses that he has been taking   Problem List / ED Course / Critical interventions / Medication management  Atrial flutter, improved with beta-blocker I ordered medication including metoprolol for tachycardia Reevaluation of the patient after these medicines showed that the patient improved I have reviewed the patients home medicines and have made adjustments as needed   Social Determinants of Health:  History of atrial flutter   Test / Admission - Considered:  Considered admission and considered intervention with defibrillation or cardioversion but cardiology recommends against this and follow-up outpatient with A-fib clinic which I think is very reasonable.  I would be worried about true compliance of the patient with his anticoagulant and the risk of cardioversion in a patient who is otherwise stable         Final Clinical Impression(s) / ED Diagnoses Final diagnoses:  Atrial fibrillation with RVR (HCC)     Noemi Chapel, MD 11/13/22 1511

## 2022-11-23 NOTE — Progress Notes (Signed)
Primary Care Physician: Caren Macadam, MD Primary Cardiologist: Dr Martinique Primary Electrophysiologist: none Referring Physician: Dr Rudy Jew is a 62 y.o. male with a history of CAD, HLD, HTN, prediabetes, atrial fibrillation who presents for follow up in the [facility].  The patient was initially diagnosed with atrial fibrillation on an event monitor in 2022. Patient is on Eliquis for a CHADS2VASC score of 2. When in afib he has symptoms of "heart pounding", "restlessness", and fatigue. Patient was seen at the ED 11/13/22 with rapid atrial flutter. He was given IV metoprolol which slowed him to atrial flutter with variable block. His home BB was increased (he was only taking 50 mg).   On follow up today, patient reports that he has been having tachypalpitations and chest discomfort 1-2 per day. These episodes can last 30 minutes up to several hours. Troponin normal at ED visit. He is in SR today. His chest discomfort is a squeezing pain across his chest which does radiate into his jaw. It does not occur with activity, only when he also has tachypalpitations and elevated heart rates. He has identified alcohol and chocolate as triggers. He has lost ~ 30 lbs since starting Ozempic. No bleeding issues on anticoagulation. He did not complete home sleep study.   Today, he denies symptoms of shortness of breath, orthopnea, PND, lower extremity edema, dizziness, presyncope, syncope, bleeding, or neurologic sequela. The patient is tolerating medications without difficulties and is otherwise without complaint today.    Atrial Fibrillation Risk Factors:  he does have symptoms or diagnosis of sleep apnea. he does not have a history of rheumatic fever. he does have a history of alcohol use. The patient does not have a history of early familial atrial fibrillation or other arrhythmias.  he has a BMI of Body mass index is 34.09 kg/m.Marland Kitchen Filed Weights   11/24/22  0835  Weight: 110.9 kg    Family History  Problem Relation Age of Onset   Clotting disorder Mother    Pulmonary embolism Mother    Diabetes Mother    Hyperlipidemia Mother    Hypertension Mother    Heart disease Brother        stent   Heart attack Brother      Atrial Fibrillation Management history:  Previous antiarrhythmic drugs: none Previous cardioversions: none Previous ablations: none CHADS2VASC score: 2 Anticoagulation history: Eliquis   Past Medical History:  Diagnosis Date   Anemia    as a small child   Anxiety    Arthritis    "lower spine; knees" (11/01/2017)   Chronic lower back pain    Depression    GERD (gastroesophageal reflux disease)    Headache    High cholesterol    History of kidney stones    Hypertension    Pneumonia 06/2017; 08/2017   walking pneumonia; treated   Spinal stenosis    "lower back" (11/01/2017)   Past Surgical History:  Procedure Laterality Date   CORONARY STENT INTERVENTION N/A 03/04/2020   Procedure: CORONARY STENT INTERVENTION;  Surgeon: Martinique, Peter M, MD;  Location: [facility];  Service: Cardiovascular;  Laterality: N/A;   EYE SURGERY Bilateral 2018   Cataracts removed by Dr. Gershon Crane   FOOT SURGERY Left 04/2021   HAND RECONSTRUCTION Left 1989   "injured when washing dishes"   INTRAVASCULAR PRESSURE WIRE/FFR STUDY N/A 03/04/2020   Procedure: INTRAVASCULAR PRESSURE WIRE/FFR STUDY;  Surgeon: Martinique, Peter M, MD;  Location: [facility];  Service: Cardiovascular;  Laterality: N/A;   INTRAVASCULAR ULTRASOUND/IVUS N/A 03/04/2020   Procedure: Intravascular Ultrasound/IVUS;  Surgeon: Martinique, Peter M, MD;  Location: [facility];  Service: Cardiovascular;  Laterality: N/A;   JOINT REPLACEMENT     KNEE ARTHROSCOPY Right 2008   LEFT HEART CATH AND CORONARY ANGIOGRAPHY N/A 03/04/2020   Procedure: LEFT HEART CATH AND CORONARY ANGIOGRAPHY;  Surgeon: Martinique, Peter M, MD;  Location: [facility];  Service:  Cardiovascular;  Laterality: N/A;   LUMBAR LAMINECTOMY/DECOMPRESSION MICRODISCECTOMY Bilateral 08/24/2021   Procedure: LAMINECTOMY, FORAMINOTOMY L34;  Surgeon: Kary Kos, MD;  Location: [facility];  Service: Neurosurgery;  Laterality: Bilateral;  3C   TOTAL KNEE ARTHROPLASTY Right 11/01/2017   TOTAL KNEE ARTHROPLASTY Right 11/01/2017   Procedure: RIGHT TOTAL KNEE ARTHROPLASTY;  Surgeon: Mcarthur Rossetti, MD;  Location: [facility];  Service: Orthopedics;  Laterality: Right;   WISDOM TOOTH EXTRACTION  1981    Current Outpatient Medications  Medication Sig Dispense Refill   acetaminophen (TYLENOL) 500 MG tablet Take 500-1,000 mg by mouth every 6 (six) hours as needed for moderate pain.     atorvastatin (LIPITOR) 80 MG tablet Take 1 tablet (80 mg total) by mouth daily. 90 tablet 3   chlorthalidone (HYGROTON) 25 MG tablet Take 1 tablet (25 mg total) by mouth daily. 90 tablet 3   colchicine 0.6 MG tablet Take 1 tablet (0.6 mg total) by mouth 2 (two) times daily. 8 tablet 0   CONTOUR NEXT TEST test strip as directed.     HYDROcodone-acetaminophen (NORCO/VICODIN) 5-325 MG tablet Take 1 tablet by mouth every 6 (six) hours as needed for moderate pain.     loratadine (CLARITIN) 10 MG tablet Take 10 mg by mouth daily as needed for allergies.      losartan (COZAAR) 100 MG tablet Take 100 mg by mouth daily.     metoprolol succinate (TOPROL XL) 50 MG 24 hr tablet Take 1.5 tablets (75 mg total) by mouth daily. 120 tablet 3   Misc Natural Products (TART CHERRY ADVANCED PO) Take 1 capsule by mouth daily.     OZEMPIC, 2 MG/DOSE, 8 MG/3ML SOPN Inject 2 mg into the skin once a week. Saturday     Polyethyl Glycol-Propyl Glycol (SYSTANE OP) Place 1 drop into both eyes daily as needed (dry eyes).     potassium chloride (KLOR-CON) 10 MEQ tablet Take 10 mEq by mouth daily.     predniSONE (DELTASONE) 20 MG tablet Take 2 tablets (40 mg total) by mouth daily. 10 tablet 0   rivaroxaban (XARELTO) 20 MG TABS tablet Take 1  tablet (20 mg total) by mouth daily with supper. 30 tablet 4   metoprolol tartrate (LOPRESSOR) 25 MG tablet Take 0.5 tablets (12.5 mg total) by mouth as needed (for palpitations). 30 tablet 2   nitroGLYCERIN (NITROSTAT) 0.4 MG SL tablet Place 1 tablet (0.4 mg total) under the tongue every 5 (five) minutes as needed for chest pain. 90 tablet 3   No current facility-administered medications for this encounter.    Allergies  Allergen Reactions   Iodides Anaphylaxis, Hives and Other (See Comments)    Immediate reaction, looks like chicken pox   Tetanus Toxoids Other (See Comments)    Swelling and tenderness in arm > Local reaction. Older version of tetanus shot, tolerates the newer version     Social History   Socioeconomic History   Marital status: Married    Spouse name: Not on file   Number of  children: 2   Years of education: Not on file   Highest education level: Not on file  Occupational History    Employer: [employer]  Tobacco Use   Smoking status: Never   Smokeless tobacco: Never   Tobacco comments:    Never smoke 05/05/22  Vaping Use   Vaping Use: Never used  Substance and Sexual Activity   Alcohol use: Not Currently    Alcohol/week: 1.0 - 2.0 standard drink of alcohol    Types: 1 - 2 Standard drinks or equivalent per week    Comment: nothing in a week 05/05/22   Drug use: No   Sexual activity: Not on file  Other Topics Concern   Not on file  Social History Narrative   Not on file   Social Determinants of Health   Financial Resource Strain: Not on file  Food Insecurity: Not on file  Transportation Needs: Not on file  Physical Activity: Not on file  Stress: Not on file  Social Connections: Not on file  Intimate Partner Violence: Not on file    ROS- All systems are reviewed and negative except as per the HPI above.  Physical Exam: Vitals:   11/24/22 0835  BP: 116/70  Pulse: 86  Weight: 110.9 kg  Height: '5\' 11"'$  (1.803 m)     GEN- The  patient is a well appearing obese male, alert and oriented x 3 today.   HEENT-head normocephalic, atraumatic, sclera clear, conjunctiva pink, hearing intact, trachea midline. Lungs- Clear to ausculation bilaterally, normal work of breathing Heart- Regular rate and rhythm, no murmurs, rubs or gallops  GI- soft, NT, ND, + BS Extremities- no clubbing, cyanosis, or edema MS- no significant deformity or atrophy Skin- no rash or lesion Psych- euthymic mood, full affect Neuro- strength and sensation are intact   Wt Readings from Last 3 Encounters:  11/24/22 110.9 kg  05/05/22 122.7 kg  01/06/22 122.5 kg    EKG today demonstrates  SR, PVC Vent. rate 86 BPM PR interval 146 ms QRS duration 88 ms QT/QTcB 372/445 ms  Echo 06/30/21 demonstrated   1. Left ventricular ejection fraction, by estimation, is 60 to 65%. The  left ventricle has normal function. The left ventricle has no regional  wall motion abnormalities. Left ventricular diastolic parameters are  consistent with Grade I diastolic dysfunction (impaired relaxation).   2. Right ventricular systolic function is normal. The right ventricular  size is normal. Tricuspid regurgitation signal is inadequate for assessing PA pressure.   3. Left atrial size was mildly dilated.   4. The mitral valve is normal in structure. No evidence of mitral valve  regurgitation. No evidence of mitral stenosis.   5. The aortic valve is normal in structure. Aortic valve regurgitation is  not visualized. No aortic stenosis is present.   6. The inferior vena cava is normal in size with greater than 50%  respiratory variability, suggesting right atrial pressure of 3 mmHg  Epic records are reviewed at length today  CHA2DS2-VASc Score = 2  The patient's score is based upon: CHF History: 0 HTN History: 1 Diabetes History: 0 (prediabetes) Stroke History: 0 Vascular Disease History: 1 Age Score: 0 Gender Score: 0       ASSESSMENT AND PLAN: 1.  Paroxysmal Atrial Fibrillation (ICD10:  I48.0) The patient's CHA2DS2-VASc score is 2, indicating a 2.2% annual risk of stroke.   Patient having daily symptoms of afib.  We discussed rhythm control options including AAD and ablation.  Patient agreeable to consultation with EP for ablation. Short term, will start Multaq to bridge to ablation.  Continue Toprol 75 mg daily Continue Xarelto 20 mg   2. Secondary Hypercoagulable State (ICD10:  D68.69) The patient is at significant risk for stroke/thromboembolism based upon his CHA2DS2-VASc Score of 2.  Continue Rivaroxaban (Xarelto).   3. Obesity Body mass index is 34.09 kg/m. Lifestyle modification was discussed and encouraged including regular physical activity and weight reduction. Patient has done well on Ozempic.   4. Snoring/suspected obstructive sleep apnea The importance of adequate treatment of sleep apnea was discussed today in order to improve our ability to maintain sinus rhythm long term. Patient plans to reach out to PCP to complete repeat sleep study.   5. CAD S/p DES 2021 Low risk Lexiscan 12/2021 Troponin WNL at ED visit 11/13/22 while in afib. Patient is having typical chest pain symptoms associated with his afib (squeezing pain radiating to jaw), similar to his symptoms 11/2021. He no showed his visit with Dr Martinique after his stress test. Will get him back into Dr Doug Sou office to discuss if further ischemic workup is necessary.    Follow up in the AF clinic next week for ECG after starting Multaq and then with EP to discuss ablation.    [facility] [address] [place], [place] [address] [phone] 11/24/2022 8:42 AM

## 2022-11-24 ENCOUNTER — Encounter (HOSPITAL_COMMUNITY): Payer: Self-pay | Admitting: Physician Assistant

## 2022-11-24 ENCOUNTER — Ambulatory Visit (HOSPITAL_COMMUNITY)
Admission: RE | Admit: 2022-11-24 | Discharge: 2022-11-24 | Disposition: A | Discharge status: Home / Self Care | Payer: Managed Care, Other (non HMO) | Source: Ambulatory Visit | Attending: Physician Assistant | Admitting: Physician Assistant

## 2022-11-24 VITALS — BP 116/70 | HR 86 | Ht 71.0 in | Wt 244.4 lb

## 2022-11-24 DIAGNOSIS — I2510 Atherosclerotic heart disease of native coronary artery without angina pectoris: Secondary | ICD-10-CM | POA: Diagnosis not present

## 2022-11-24 DIAGNOSIS — I480 Paroxysmal atrial fibrillation: Secondary | ICD-10-CM | POA: Diagnosis not present

## 2022-11-24 DIAGNOSIS — Z7985 Long-term (current) use of injectable non-insulin antidiabetic drugs: Secondary | ICD-10-CM | POA: Insufficient documentation

## 2022-11-24 DIAGNOSIS — R0789 Other chest pain: Secondary | ICD-10-CM | POA: Diagnosis not present

## 2022-11-24 DIAGNOSIS — R0683 Snoring: Secondary | ICD-10-CM | POA: Diagnosis not present

## 2022-11-24 DIAGNOSIS — R7303 Prediabetes: Secondary | ICD-10-CM | POA: Diagnosis not present

## 2022-11-24 DIAGNOSIS — E785 Hyperlipidemia, unspecified: Secondary | ICD-10-CM | POA: Insufficient documentation

## 2022-11-24 DIAGNOSIS — E669 Obesity, unspecified: Secondary | ICD-10-CM | POA: Insufficient documentation

## 2022-11-24 DIAGNOSIS — I10 Essential (primary) hypertension: Secondary | ICD-10-CM | POA: Insufficient documentation

## 2022-11-24 DIAGNOSIS — Z7901 Long term (current) use of anticoagulants: Secondary | ICD-10-CM | POA: Insufficient documentation

## 2022-11-24 DIAGNOSIS — Z79899 Other long term (current) drug therapy: Secondary | ICD-10-CM | POA: Insufficient documentation

## 2022-11-24 DIAGNOSIS — D6869 Other thrombophilia: Secondary | ICD-10-CM

## 2022-11-24 MED ORDER — MULTAQ 400 MG PO TABS: 400.0000 mg | ORAL_TABLET | Freq: Two times a day (BID) | ORAL | 3 refills | Status: DC

## 2022-11-24 NOTE — Patient Instructions (Signed)
Start Multaq '400mg'$  twice a day WITH FOOD

## 2022-12-02 ENCOUNTER — Ambulatory Visit (HOSPITAL_COMMUNITY)
Admission: RE | Admit: 2022-12-02 | Discharge: 2022-12-02 | Disposition: A | Discharge status: Home / Self Care | Payer: Managed Care, Other (non HMO) | Source: Ambulatory Visit | Attending: Physician Assistant | Admitting: Physician Assistant

## 2022-12-02 DIAGNOSIS — I480 Paroxysmal atrial fibrillation: Secondary | ICD-10-CM | POA: Diagnosis present

## 2022-12-02 NOTE — Progress Notes (Signed)
Patient returns for ECG after starting Multaq. ECG shows:  SR Vent. rate 70 BPM PR interval 162 ms QRS duration 88 ms QT/QTcB 412/444 ms   Patient reports that he has only had one brief episodes of "fluttering" after starting Multaq, symptoms are much improved. Follow up with Dr Curt Bears for ablation as scheduled. Patient has not been scheduled with Dr Martinique yet.

## 2022-12-03 ENCOUNTER — Other Ambulatory Visit: Payer: Self-pay | Admitting: Cardiology

## 2022-12-07 ENCOUNTER — Telehealth: Payer: Self-pay | Admitting: Cardiology

## 2022-12-07 NOTE — Telephone Encounter (Signed)
Stephen Mire, RN  P Cv Div Nl Scheduling Pt needs appt with dr jordan/app for exertional chest pain per ricky fenton pa [facility]  LVM x3 with no success in scheduling, will send MyChart message

## 2022-12-29 ENCOUNTER — Ambulatory Visit: Payer: Managed Care, Other (non HMO) | Attending: Cardiology | Admitting: Cardiology

## 2022-12-29 ENCOUNTER — Encounter: Payer: Self-pay | Admitting: Cardiology

## 2022-12-29 VITALS — BP 90/62 | HR 76 | Ht 71.0 in | Wt 243.0 lb

## 2022-12-29 DIAGNOSIS — D6869 Other thrombophilia: Secondary | ICD-10-CM

## 2022-12-29 DIAGNOSIS — I480 Paroxysmal atrial fibrillation: Secondary | ICD-10-CM

## 2022-12-29 DIAGNOSIS — I2510 Atherosclerotic heart disease of native coronary artery without angina pectoris: Secondary | ICD-10-CM

## 2022-12-29 NOTE — Progress Notes (Signed)
Electrophysiology Office Note   Date:  12/29/2022   ID:  Stephen Scott, DOB 1961-04-07, MRN [id number]  PCP:  Caren Macadam, MD  Cardiologist:  Martinique Primary Electrophysiologist:  Constance Haw, MD    Chief Complaint: AF   History of Present Illness: Stephen Scott is a 62 y.o. male who is being seen today for the evaluation of AF at the request of Fenton, Clint R, PA. Presenting today for electrophysiology evaluation.  He has a history of significant coronary artery disease, hyperlipidemia, hypertension, prediabetes, atrial fibrillation.  He was diagnosed with atrial fibrillation in 2022.  He has symptoms of heart pounding, restlessness, fatigue.  He was seen in the emergency room 11/13/2022 with atrial flutter.  When he is in atrial fibrillation, he has discomfort in his throat as well as shortness of breath and fatigue.  He is not atrial fibrillation is able to do all his daily activities.  Today, he denies symptoms of palpitations, chest pain, shortness of breath, orthopnea, PND, lower extremity edema, claudication, dizziness, presyncope, syncope, bleeding, or neurologic sequela. The patient is tolerating medications without difficulties.    Past Medical History:  Diagnosis Date   Anemia    as a small child   Anxiety    Arthritis    "lower spine; knees" (11/01/2017)   Chronic lower back pain    Depression    GERD (gastroesophageal reflux disease)    Headache    High cholesterol    History of kidney stones    Hypertension    Pneumonia 06/2017; 08/2017   walking pneumonia; treated   Spinal stenosis    "lower back" (11/01/2017)   Past Surgical History:  Procedure Laterality Date   CORONARY PRESSURE/FFR STUDY N/A 03/04/2020   Procedure: INTRAVASCULAR PRESSURE WIRE/FFR STUDY;  Surgeon: Martinique, Peter M, MD;  Location: [facility];  Service: Cardiovascular;  Laterality: N/A;   CORONARY STENT INTERVENTION N/A 03/04/2020   Procedure: CORONARY STENT INTERVENTION;   Surgeon: Martinique, Peter M, MD;  Location: [facility];  Service: Cardiovascular;  Laterality: N/A;   CORONARY ULTRASOUND/IVUS N/A 03/04/2020   Procedure: Intravascular Ultrasound/IVUS;  Surgeon: Martinique, Peter M, MD;  Location: [facility];  Service: Cardiovascular;  Laterality: N/A;   EYE SURGERY Bilateral 2018   Cataracts removed by Dr. Gershon Crane   FOOT SURGERY Left 04/2021   HAND RECONSTRUCTION Left 1989   "injured when washing dishes"   JOINT REPLACEMENT     KNEE ARTHROSCOPY Right 2008   LEFT HEART CATH AND CORONARY ANGIOGRAPHY N/A 03/04/2020   Procedure: LEFT HEART CATH AND CORONARY ANGIOGRAPHY;  Surgeon: Martinique, Peter M, MD;  Location: [facility];  Service: Cardiovascular;  Laterality: N/A;   LUMBAR LAMINECTOMY/DECOMPRESSION MICRODISCECTOMY Bilateral 08/24/2021   Procedure: LAMINECTOMY, FORAMINOTOMY L34;  Surgeon: Kary Kos, MD;  Location: [facility];  Service: Neurosurgery;  Laterality: Bilateral;  3C   TOTAL KNEE ARTHROPLASTY Right 11/01/2017   TOTAL KNEE ARTHROPLASTY Right 11/01/2017   Procedure: RIGHT TOTAL KNEE ARTHROPLASTY;  Surgeon: Mcarthur Rossetti, MD;  Location: [facility];  Service: Orthopedics;  Laterality: Right;   WISDOM TOOTH EXTRACTION  1981     Current Outpatient Medications  Medication Sig Dispense Refill   acetaminophen (TYLENOL) 500 MG tablet Take 500-1,000 mg by mouth every 6 (six) hours as needed for moderate pain.     atorvastatin (LIPITOR) 80 MG tablet Take 1 tablet (80 mg total) by mouth daily. 90 tablet 3   chlorthalidone (HYGROTON) 25 MG tablet TAKE 1  TABLET(25 MG) BY MOUTH DAILY 90 tablet 3   colchicine 0.6 MG tablet Take 1 tablet (0.6 mg total) by mouth 2 (two) times daily. 8 tablet 0   CONTOUR NEXT TEST test strip as directed.     dronedarone (MULTAQ) 400 MG tablet Take 1 tablet (400 mg total) by mouth 2 (two) times daily with a meal. 60 tablet 3   loratadine (CLARITIN) 10 MG tablet Take 10 mg by mouth daily as needed for allergies.       losartan (COZAAR) 100 MG tablet Take 100 mg by mouth daily.     metoprolol succinate (TOPROL XL) 50 MG 24 hr tablet Take 1.5 tablets (75 mg total) by mouth daily. 120 tablet 3   Misc Natural Products (TART CHERRY ADVANCED PO) Take 1 capsule by mouth daily.     OZEMPIC, 2 MG/DOSE, 8 MG/3ML SOPN Inject 2 mg into the skin once a week. Saturday     potassium chloride (KLOR-CON) 10 MEQ tablet Take 10 mEq by mouth daily.     predniSONE (DELTASONE) 20 MG tablet Take 2 tablets (40 mg total) by mouth daily. 10 tablet 0   rivaroxaban (XARELTO) 20 MG TABS tablet Take 1 tablet (20 mg total) by mouth daily with supper. 30 tablet 4   metoprolol tartrate (LOPRESSOR) 25 MG tablet Take 0.5 tablets (12.5 mg total) by mouth as needed (for palpitations). 30 tablet 2   nitroGLYCERIN (NITROSTAT) 0.4 MG SL tablet Place 1 tablet (0.4 mg total) under the tongue every 5 (five) minutes as needed for chest pain. 90 tablet 3   No current facility-administered medications for this visit.    Allergies:   Iodides and Tetanus toxoids   Social History:  The patient  reports that he has never smoked. He has never used smokeless tobacco. He reports that he does not currently use alcohol after a past usage of about 1.0 - 2.0 standard drink of alcohol per week. He reports that he does not use drugs.   Family History:  The patient's family history includes Clotting disorder in his mother; Diabetes in his mother; Heart attack in his brother; Heart disease in his brother; Hyperlipidemia in his mother; Hypertension in his mother; Pulmonary embolism in his mother.    ROS:  Please see the history of present illness.   Otherwise, review of systems is positive for none.   All other systems are reviewed and negative.    PHYSICAL EXAM: VS:  BP 90/62   Pulse 76   Ht 5\' 11"  (1.803 m)   Wt 243 lb (110.2 kg)   SpO2 97%   BMI 33.89 kg/m  , BMI Body mass index is 33.89 kg/m. GEN: Well nourished, well developed, in no acute distress   HEENT: normal  Neck: no JVD, carotid bruits, or masses Cardiac: RRR; no murmurs, rubs, or gallops,no edema  Respiratory:  clear to auscultation bilaterally, normal work of breathing GI: soft, nontender, nondistended, + BS MS: no deformity or atrophy  Skin: warm and dry Neuro:  Strength and sensation are intact Psych: euthymic mood, full affect  EKG:  EKG is ordered today. Personal review of the ekg ordered shows sinus rhythm  Recent Labs: 11/13/2022: BUN 17; Creatinine, Ser 1.09; Hemoglobin 15.1; Platelets 243; Potassium 3.4; Sodium 137    Lipid Panel     Component Value Date/Time   CHOL 183 12/24/2021 0939   TRIG 120 12/24/2021 0939   HDL 43 12/24/2021 0939   CHOLHDL 4.3 12/24/2021 0939   LDLCALC 118 (  H) 12/24/2021 0939     Wt Readings from Last 3 Encounters:  12/29/22 243 lb (110.2 kg)  11/24/22 244 lb 6.4 oz (110.9 kg)  05/05/22 270 lb 9.6 oz (122.7 kg)      Other studies Reviewed: Additional studies/ records that were reviewed today include: TTE 06/30/21  Review of the above records today demonstrates:   1. Left ventricular ejection fraction, by estimation, is 60 to 65%. The  left ventricle has normal function. The left ventricle has no regional  wall motion abnormalities. Left ventricular diastolic parameters are  consistent with Grade I diastolic  dysfunction (impaired relaxation).   2. Right ventricular systolic function is normal. The right ventricular  size is normal. Tricuspid regurgitation signal is inadequate for assessing  PA pressure.   3. Left atrial size was mildly dilated.   4. The mitral valve is normal in structure. No evidence of mitral valve  regurgitation. No evidence of mitral stenosis.   5. The aortic valve is normal in structure. Aortic valve regurgitation is  not visualized. No aortic stenosis is present.   6. The inferior vena cava is normal in size with greater than 50%  respiratory variability, suggesting right atrial pressure of 3  mmHg.    ASSESSMENT AND PLAN:  1.  Paroxysmal atrial fibrillation/flutter: CHA2DS2-VASc of 2.  Currently on metoprolol and Xarelto.  Has recently been started on Multaq.  He would prefer an alternative rhythm control strategy as he is continuing to have episodes of atrial fibrillation.  Due to that, we Esker Dever plan for ablation.  Risk and benefits have been discussed.  He understands the risks and is agreed to the procedure.  He is on Ozempic and Quanell Loughney hold a dose prior to ablation.  Risk, benefits, and alternatives to EP study and radiofrequency ablation for afib were also discussed in detail today. These risks include but are not limited to stroke, bleeding, vascular damage, tamponade, perforation, damage to the esophagus, lungs, and other structures, pulmonary vein stenosis, worsening renal function, and death. The patient understands these risk and wishes to proceed.  We Islah Eve therefore proceed with catheter ablation at the next available time.  Carto, ICE, anesthesia are requested for the procedure.  Kezia Benevides also obtain CT PV protocol prior to the procedure to exclude LAA thrombus and further evaluate atrial anatomy.   2.  Second hypercoagulable state: Currently on Xarelto for atrial fibrillation  3.  Suspected obstructive sleep apnea: Has plans for repeat sleep study  4.  Coronary artery disease: DES to the circumflex in 2021.  Recent SPECT rest as a low risk study.  Plan per primary cardiology.  4.  Obesity: Lifestyle modification encouraged Body mass index is 33.89 kg/m.   Current medicines are reviewed at length with the patient today.   The patient does not have concerns regarding his medicines.  The following changes were made today:  none  Labs/ tests ordered today include:  Orders Placed This Encounter  Procedures   EKG 12-Lead     Disposition:   FU with Tamario Heal 3 months  Signed, Hanz Winterhalter Meredith Leeds, MD  12/29/2022 9:31 AM     [place] [address] [phone] (office) [phone] (fax)

## 2022-12-29 NOTE — H&P (View-Only) (Signed)
 Electrophysiology Office Note   Date:  12/29/2022   ID:  Stephen Scott, DOB 01/07/1961, MRN [id number]  PCP:  Hagler, Rachel, MD  Cardiologist:  Jordan Primary Electrophysiologist:  Albertia Carvin Martin Ora Mcnatt, MD    Chief Complaint: AF   History of Present Illness: Stephen Scott is a 62 y.o. male who is being seen today for the evaluation of AF at the request of Fenton, Clint R, PA. Presenting today for electrophysiology evaluation.  He has a history of significant coronary artery disease, hyperlipidemia, hypertension, prediabetes, atrial fibrillation.  He was diagnosed with atrial fibrillation in 2022.  He has symptoms of heart pounding, restlessness, fatigue.  He was seen in the emergency room 11/13/2022 with atrial flutter.  When he is in atrial fibrillation, he has discomfort in his throat as well as shortness of breath and fatigue.  He is not atrial fibrillation is able to do all his daily activities.  Today, he denies symptoms of palpitations, chest pain, shortness of breath, orthopnea, PND, lower extremity edema, claudication, dizziness, presyncope, syncope, bleeding, or neurologic sequela. The patient is tolerating medications without difficulties.    Past Medical History:  Diagnosis Date   Anemia    as a small child   Anxiety    Arthritis    "lower spine; knees" (11/01/2017)   Chronic lower back pain    Depression    GERD (gastroesophageal reflux disease)    Headache    High cholesterol    History of kidney stones    Hypertension    Pneumonia 06/2017; 08/2017   walking pneumonia; treated   Spinal stenosis    "lower back" (11/01/2017)   Past Surgical History:  Procedure Laterality Date   CORONARY PRESSURE/FFR STUDY N/A 03/04/2020   Procedure: INTRAVASCULAR PRESSURE WIRE/FFR STUDY;  Surgeon: Jordan, Peter M, MD;  Location: [facility];  Service: Cardiovascular;  Laterality: N/A;   CORONARY STENT INTERVENTION N/A 03/04/2020   Procedure: CORONARY STENT INTERVENTION;   Surgeon: Jordan, Peter M, MD;  Location: [facility];  Service: Cardiovascular;  Laterality: N/A;   CORONARY ULTRASOUND/IVUS N/A 03/04/2020   Procedure: Intravascular Ultrasound/IVUS;  Surgeon: Jordan, Peter M, MD;  Location: [facility];  Service: Cardiovascular;  Laterality: N/A;   EYE SURGERY Bilateral 2018   Cataracts removed by Dr. Shapiro   FOOT SURGERY Left 04/2021   HAND RECONSTRUCTION Left 1989   "injured when washing dishes"   JOINT REPLACEMENT     KNEE ARTHROSCOPY Right 2008   LEFT HEART CATH AND CORONARY ANGIOGRAPHY N/A 03/04/2020   Procedure: LEFT HEART CATH AND CORONARY ANGIOGRAPHY;  Surgeon: Jordan, Peter M, MD;  Location: [facility];  Service: Cardiovascular;  Laterality: N/A;   LUMBAR LAMINECTOMY/DECOMPRESSION MICRODISCECTOMY Bilateral 08/24/2021   Procedure: LAMINECTOMY, FORAMINOTOMY L34;  Surgeon: Cram, Gary, MD;  Location: [facility];  Service: Neurosurgery;  Laterality: Bilateral;  3C   TOTAL KNEE ARTHROPLASTY Right 11/01/2017   TOTAL KNEE ARTHROPLASTY Right 11/01/2017   Procedure: RIGHT TOTAL KNEE ARTHROPLASTY;  Surgeon: Blackman, Christopher Y, MD;  Location: [facility];  Service: Orthopedics;  Laterality: Right;   WISDOM TOOTH EXTRACTION  1981     Current Outpatient Medications  Medication Sig Dispense Refill   acetaminophen (TYLENOL) 500 MG tablet Take 500-1,000 mg by mouth every 6 (six) hours as needed for moderate pain.     atorvastatin (LIPITOR) 80 MG tablet Take 1 tablet (80 mg total) by mouth daily. 90 tablet 3   chlorthalidone (HYGROTON) 25 MG tablet TAKE 1   TABLET(25 MG) BY MOUTH DAILY 90 tablet 3   colchicine 0.6 MG tablet Take 1 tablet (0.6 mg total) by mouth 2 (two) times daily. 8 tablet 0   CONTOUR NEXT TEST test strip as directed.     dronedarone (MULTAQ) 400 MG tablet Take 1 tablet (400 mg total) by mouth 2 (two) times daily with a meal. 60 tablet 3   loratadine (CLARITIN) 10 MG tablet Take 10 mg by mouth daily as needed for allergies.       losartan (COZAAR) 100 MG tablet Take 100 mg by mouth daily.     metoprolol succinate (TOPROL XL) 50 MG 24 hr tablet Take 1.5 tablets (75 mg total) by mouth daily. 120 tablet 3   Misc Natural Products (TART CHERRY ADVANCED PO) Take 1 capsule by mouth daily.     OZEMPIC, 2 MG/DOSE, 8 MG/3ML SOPN Inject 2 mg into the skin once a week. Saturday     potassium chloride (KLOR-CON) 10 MEQ tablet Take 10 mEq by mouth daily.     predniSONE (DELTASONE) 20 MG tablet Take 2 tablets (40 mg total) by mouth daily. 10 tablet 0   rivaroxaban (XARELTO) 20 MG TABS tablet Take 1 tablet (20 mg total) by mouth daily with supper. 30 tablet 4   metoprolol tartrate (LOPRESSOR) 25 MG tablet Take 0.5 tablets (12.5 mg total) by mouth as needed (for palpitations). 30 tablet 2   nitroGLYCERIN (NITROSTAT) 0.4 MG SL tablet Place 1 tablet (0.4 mg total) under the tongue every 5 (five) minutes as needed for chest pain. 90 tablet 3   No current facility-administered medications for this visit.    Allergies:   Iodides and Tetanus toxoids   Social History:  The patient  reports that he has never smoked. He has never used smokeless tobacco. He reports that he does not currently use alcohol after a past usage of about 1.0 - 2.0 standard drink of alcohol per week. He reports that he does not use drugs.   Family History:  The patient's family history includes Clotting disorder in his mother; Diabetes in his mother; Heart attack in his brother; Heart disease in his brother; Hyperlipidemia in his mother; Hypertension in his mother; Pulmonary embolism in his mother.    ROS:  Please see the history of present illness.   Otherwise, review of systems is positive for none.   All other systems are reviewed and negative.    PHYSICAL EXAM: VS:  BP 90/62   Pulse 76   Ht 5' 11" (1.803 m)   Wt 243 lb (110.2 kg)   SpO2 97%   BMI 33.89 kg/m  , BMI Body mass index is 33.89 kg/m. GEN: Well nourished, well developed, in no acute distress   HEENT: normal  Neck: no JVD, carotid bruits, or masses Cardiac: RRR; no murmurs, rubs, or gallops,no edema  Respiratory:  clear to auscultation bilaterally, normal work of breathing GI: soft, nontender, nondistended, + BS MS: no deformity or atrophy  Skin: warm and dry Neuro:  Strength and sensation are intact Psych: euthymic mood, full affect  EKG:  EKG is ordered today. Personal review of the ekg ordered shows sinus rhythm  Recent Labs: 11/13/2022: BUN 17; Creatinine, Ser 1.09; Hemoglobin 15.1; Platelets 243; Potassium 3.4; Sodium 137    Lipid Panel     Component Value Date/Time   CHOL 183 12/24/2021 0939   TRIG 120 12/24/2021 0939   HDL 43 12/24/2021 0939   CHOLHDL 4.3 12/24/2021 0939   LDLCALC 118 (  H) 12/24/2021 0939     Wt Readings from Last 3 Encounters:  12/29/22 243 lb (110.2 kg)  11/24/22 244 lb 6.4 oz (110.9 kg)  05/05/22 270 lb 9.6 oz (122.7 kg)      Other studies Reviewed: Additional studies/ records that were reviewed today include: TTE 06/30/21  Review of the above records today demonstrates:   1. Left ventricular ejection fraction, by estimation, is 60 to 65%. The  left ventricle has normal function. The left ventricle has no regional  wall motion abnormalities. Left ventricular diastolic parameters are  consistent with Grade I diastolic  dysfunction (impaired relaxation).   2. Right ventricular systolic function is normal. The right ventricular  size is normal. Tricuspid regurgitation signal is inadequate for assessing  PA pressure.   3. Left atrial size was mildly dilated.   4. The mitral valve is normal in structure. No evidence of mitral valve  regurgitation. No evidence of mitral stenosis.   5. The aortic valve is normal in structure. Aortic valve regurgitation is  not visualized. No aortic stenosis is present.   6. The inferior vena cava is normal in size with greater than 50%  respiratory variability, suggesting right atrial pressure of 3  mmHg.    ASSESSMENT AND PLAN:  1.  Paroxysmal atrial fibrillation/flutter: CHA2DS2-VASc of 2.  Currently on metoprolol and Xarelto.  Has recently been started on Multaq.  He would prefer an alternative rhythm control strategy as he is continuing to have episodes of atrial fibrillation.  Due to that, we Byrd Terrero plan for ablation.  Risk and benefits have been discussed.  He understands the risks and is agreed to the procedure.  He is on Ozempic and Lovel Suazo hold a dose prior to ablation.  Risk, benefits, and alternatives to EP study and radiofrequency ablation for afib were also discussed in detail today. These risks include but are not limited to stroke, bleeding, vascular damage, tamponade, perforation, damage to the esophagus, lungs, and other structures, pulmonary vein stenosis, worsening renal function, and death. The patient understands these risk and wishes to proceed.  We Chancelor Hardrick therefore proceed with catheter ablation at the next available time.  Carto, ICE, anesthesia are requested for the procedure.  Shonda Mandarino also obtain CT PV protocol prior to the procedure to exclude LAA thrombus and further evaluate atrial anatomy.   2.  Second hypercoagulable state: Currently on Xarelto for atrial fibrillation  3.  Suspected obstructive sleep apnea: Has plans for repeat sleep study  4.  Coronary artery disease: DES to the circumflex in 2021.  Recent SPECT rest as a low risk study.  Plan per primary cardiology.  4.  Obesity: Lifestyle modification encouraged Body mass index is 33.89 kg/m.   Current medicines are reviewed at length with the patient today.   The patient does not have concerns regarding his medicines.  The following changes were made today:  none  Labs/ tests ordered today include:  Orders Placed This Encounter  Procedures   EKG 12-Lead     Disposition:   FU with Keyonta Barradas 3 months  Signed, Terie Lear Martin Jnai Snellgrove, MD  12/29/2022 9:31 AM     [facility] [address] [place] [address] [phone] (office) [phone] (fax)  

## 2022-12-29 NOTE — Patient Instructions (Addendum)
Medication Instructions:  Your physician recommends that you continue on your current medications as directed. Please refer to the Current Medication list given to you today.   *If you need a refill on your cardiac medications before your next appointment, please call your pharmacy*   Lab Work: Same day as TEE: CBC, BMET If you have labs (blood work) drawn today and your tests are completely normal, you will receive your results only by: [place] (if you have MyChart) OR A paper copy in the mail If you have any lab test that is abnormal or we need to change your treatment, we will call you to review the results.   Testing/Procedures: Your physician has requested that you have a TEE. During a TEE, sound waves are used to create images of your heart. It provides your doctor with information about the size and shape of your heart and how well your heart's chambers and valves are working. In this test, a transducer is attached to the end of a flexible tube that's guided down your throat and into your esophagus (the tube leading from you mouth to your stomach) to get a more detailed image of your heart. You are not awake for the procedure. Please see the instruction sheet given to you today. For further information please visit [email or web address].     Your physician has recommended that you have an ablation. Catheter ablation is a medical procedure used to treat some cardiac arrhythmias (irregular heartbeats). During catheter ablation, a long, thin, flexible tube is put into a blood vessel in your groin (upper thigh), or neck. This tube is called an ablation catheter. It is then guided to your heart through the blood vessel. Radio frequency waves destroy small areas of heart tissue where abnormal heartbeats may cause an arrhythmia to start.    Your atrial fibrillation ablation with be scheduled for Tuesday January 18, 2023 at 10:30am with Dr. Allegra Lai. You will need to arrive at [facility] at 8:30am. We will send instructions for your procedure to your MyChart for you to review.     Follow-Up: At [facility], you and your health needs are our priority.  As part of our continuing mission to provide you with exceptional heart care, we have created designated Provider Care Teams.  These Care Teams include your primary Cardiologist (physician) and Advanced Practice Providers (APPs -  Physician Assistants and Nurse Practitioners) who all work together to provide you with the care you need, when you need it.   Your next appointment:   4 week(s) after your ablation   Provider:   You will follow up in the [facility] located at [facility]. Your provider will be: Clint R. Fenton, PA-C

## 2022-12-31 ENCOUNTER — Other Ambulatory Visit: Payer: Self-pay | Admitting: Cardiology

## 2022-12-31 ENCOUNTER — Encounter: Payer: Self-pay | Admitting: Cardiology

## 2022-12-31 DIAGNOSIS — I480 Paroxysmal atrial fibrillation: Secondary | ICD-10-CM

## 2022-12-31 DIAGNOSIS — I10 Essential (primary) hypertension: Secondary | ICD-10-CM

## 2022-12-31 DIAGNOSIS — Z01812 Encounter for preprocedural laboratory examination: Secondary | ICD-10-CM

## 2022-12-31 MED ORDER — PREDNISONE 50 MG PO TABS: ORAL_TABLET | ORAL | 0 refills | Status: DC

## 2022-12-31 MED ORDER — DIPHENHYDRAMINE HCL 50 MG PO CAPS: ORAL_CAPSULE | ORAL | 0 refills | Status: DC

## 2022-12-31 NOTE — Progress Notes (Signed)
Patient is scheduled for atrial fibrillation ablation with Dr. Elberta Fortis on 01/18/23.   Pre-procedure CBC and BMET ordered. Lab appt to be scheduled.  Cardiac CT ordered, contrast pre-medications sent to [facility] pharmacy.

## 2023-01-04 ENCOUNTER — Ambulatory Visit: Payer: Managed Care, Other (non HMO)

## 2023-01-05 ENCOUNTER — Ambulatory Visit: Payer: Managed Care, Other (non HMO) | Attending: Cardiology

## 2023-01-05 DIAGNOSIS — I480 Paroxysmal atrial fibrillation: Secondary | ICD-10-CM

## 2023-01-05 DIAGNOSIS — Z01812 Encounter for preprocedural laboratory examination: Secondary | ICD-10-CM

## 2023-01-05 DIAGNOSIS — I10 Essential (primary) hypertension: Secondary | ICD-10-CM

## 2023-01-06 LAB — BASIC METABOLIC PANEL
BUN/Creatinine Ratio: 22 (ref 10–24)
BUN: 26 mg/dL (ref 8–27)
CO2: 21 mmol/L (ref 20–29)
Calcium: 8.8 mg/dL (ref 8.6–10.2)
Chloride: 99 mmol/L (ref 96–106)
Creatinine, Ser: 1.17 mg/dL (ref 0.76–1.27)
Glucose: 124 mg/dL — ABNORMAL HIGH (ref 70–99)
Potassium: 3.3 mmol/L — ABNORMAL LOW (ref 3.5–5.2)
Sodium: 137 mmol/L (ref 134–144)
eGFR: 70 mL/min/{1.73_m2} (ref >59)

## 2023-01-06 LAB — CBC
Hematocrit: 42.1 % (ref 37.5–51.0)
Hemoglobin: 14.3 g/dL (ref 13.0–17.7)
MCH: 30.3 pg (ref 26.6–33.0)
MCHC: 34 g/dL (ref 31.5–35.7)
MCV: 89 fL (ref 79–97)
Platelets: 284 10*3/uL (ref 150–450)
RBC: 4.72 x10E6/uL (ref 4.14–5.80)
RDW: 13.6 % (ref 11.6–15.4)
WBC: 12.8 10*3/uL — ABNORMAL HIGH (ref 3.4–10.8)

## 2023-01-09 NOTE — Pre-Procedure Instructions (Signed)
Attempted to call patient regarding procedure for Tuesday 4/23.  Left voicemail to not take Ozempic after 01/10/23 until after procedure.

## 2023-01-10 ENCOUNTER — Telehealth (HOSPITAL_COMMUNITY): Payer: Self-pay | Admitting: Emergency Medicine

## 2023-01-10 NOTE — Telephone Encounter (Signed)
Attempted to call patient regarding upcoming cardiac CT appointment. °Left message on voicemail with name and callback number °Dakotah Orrego RN Navigator Cardiac Imaging °[facility] °[phone] Office °[phone] Cell ° °

## 2023-01-11 ENCOUNTER — Ambulatory Visit (HOSPITAL_COMMUNITY)
Admission: RE | Admit: 2023-01-11 | Discharge: 2023-01-11 | Disposition: A | Discharge status: Home / Self Care | Payer: Managed Care, Other (non HMO) | Source: Ambulatory Visit | Attending: Cardiology | Admitting: Cardiology

## 2023-01-11 DIAGNOSIS — I480 Paroxysmal atrial fibrillation: Secondary | ICD-10-CM | POA: Diagnosis present

## 2023-01-11 MED ORDER — IOHEXOL 350 MG/ML SOLN
95.0000 mL | Freq: Once | INTRAVENOUS | Status: AC | PRN
  Administered 2023-01-11: 95 mL via INTRAVENOUS

## 2023-01-17 NOTE — Pre-Procedure Instructions (Signed)
Attempted to call patient regarding procedure instructions.  No answer 

## 2023-01-18 ENCOUNTER — Other Ambulatory Visit: Payer: Self-pay

## 2023-01-18 ENCOUNTER — Ambulatory Visit (HOSPITAL_COMMUNITY)
Admission: RE | Admit: 2023-01-18 | Discharge: 2023-01-18 | Disposition: A | Discharge status: Home / Self Care | Payer: Managed Care, Other (non HMO) | Attending: Cardiology | Admitting: Cardiology

## 2023-01-18 ENCOUNTER — Ambulatory Visit (HOSPITAL_COMMUNITY): Payer: Managed Care, Other (non HMO) | Admitting: Anesthesiology

## 2023-01-18 ENCOUNTER — Ambulatory Visit (HOSPITAL_BASED_OUTPATIENT_CLINIC_OR_DEPARTMENT_OTHER): Payer: Managed Care, Other (non HMO) | Admitting: Anesthesiology

## 2023-01-18 ENCOUNTER — Other Ambulatory Visit (HOSPITAL_COMMUNITY): Payer: Self-pay

## 2023-01-18 ENCOUNTER — Encounter (HOSPITAL_COMMUNITY)
Admission: RE | Disposition: A | Discharge status: Home / Self Care | Payer: Self-pay | Source: Home / Self Care | Attending: Cardiology

## 2023-01-18 DIAGNOSIS — Z7901 Long term (current) use of anticoagulants: Secondary | ICD-10-CM | POA: Diagnosis not present

## 2023-01-18 DIAGNOSIS — Z79899 Other long term (current) drug therapy: Secondary | ICD-10-CM | POA: Diagnosis not present

## 2023-01-18 DIAGNOSIS — I4892 Unspecified atrial flutter: Secondary | ICD-10-CM | POA: Diagnosis not present

## 2023-01-18 DIAGNOSIS — I4819 Other persistent atrial fibrillation: Secondary | ICD-10-CM

## 2023-01-18 DIAGNOSIS — I4891 Unspecified atrial fibrillation: Secondary | ICD-10-CM | POA: Diagnosis not present

## 2023-01-18 DIAGNOSIS — R7303 Prediabetes: Secondary | ICD-10-CM | POA: Insufficient documentation

## 2023-01-18 DIAGNOSIS — I10 Essential (primary) hypertension: Secondary | ICD-10-CM | POA: Diagnosis not present

## 2023-01-18 DIAGNOSIS — E785 Hyperlipidemia, unspecified: Secondary | ICD-10-CM | POA: Insufficient documentation

## 2023-01-18 DIAGNOSIS — D6859 Other primary thrombophilia: Secondary | ICD-10-CM | POA: Diagnosis not present

## 2023-01-18 DIAGNOSIS — I2510 Atherosclerotic heart disease of native coronary artery without angina pectoris: Secondary | ICD-10-CM

## 2023-01-18 DIAGNOSIS — Z6833 Body mass index (BMI) 33.0-33.9, adult: Secondary | ICD-10-CM | POA: Insufficient documentation

## 2023-01-18 DIAGNOSIS — E669 Obesity, unspecified: Secondary | ICD-10-CM | POA: Insufficient documentation

## 2023-01-18 DIAGNOSIS — F418 Other specified anxiety disorders: Secondary | ICD-10-CM | POA: Diagnosis not present

## 2023-01-18 HISTORY — PX: ATRIAL FIBRILLATION ABLATION: EP1191

## 2023-01-18 LAB — POCT ACTIVATED CLOTTING TIME
Activated Clotting Time: 298 seconds
Activated Clotting Time: 298 seconds
Activated Clotting Time: 320 seconds

## 2023-01-18 LAB — GLUCOSE, CAPILLARY
Glucose-Capillary: 123 mg/dL — ABNORMAL HIGH (ref 70–99)
Glucose-Capillary: 119 mg/dL — ABNORMAL HIGH (ref 70–99)
Glucose-Capillary: 130 mg/dL — ABNORMAL HIGH (ref 70–99)

## 2023-01-18 LAB — POCT I-STAT, CHEM 8
BUN: 17 mg/dL (ref 8–23)
Calcium, Ion: 1.1 mmol/L — ABNORMAL LOW (ref 1.15–1.40)
Chloride: 105 mmol/L (ref 98–111)
Creatinine, Ser: 0.8 mg/dL (ref 0.61–1.24)
Glucose, Bld: 112 mg/dL — ABNORMAL HIGH (ref 70–99)
HCT: 36 % — ABNORMAL LOW (ref 39.0–52.0)
Hemoglobin: 12.2 g/dL — ABNORMAL LOW (ref 13.0–17.0)
Potassium: 3.4 mmol/L — ABNORMAL LOW (ref 3.5–5.1)
Sodium: 139 mmol/L (ref 135–145)
TCO2: 26 mmol/L (ref 22–32)

## 2023-01-18 SURGERY — ATRIAL FIBRILLATION ABLATION
Anesthesia: General

## 2023-01-18 MED ORDER — ONDANSETRON HCL 4 MG/2ML IJ SOLN: 4.0000 mg | Freq: Four times a day (QID) | INTRAMUSCULAR | Status: DC | PRN

## 2023-01-18 MED ORDER — ACETAMINOPHEN 325 MG PO TABS: 650.0000 mg | ORAL_TABLET | ORAL | Status: DC | PRN

## 2023-01-18 MED ORDER — DOBUTAMINE INFUSION FOR EP/ECHO/NUC (1000 MCG/ML)
INTRAVENOUS | Status: AC
  Filled 2023-01-18: qty 250

## 2023-01-18 MED ORDER — HEPARIN SODIUM (PORCINE) 1000 UNIT/ML IJ SOLN
INTRAMUSCULAR | Status: DC | PRN
  Administered 2023-01-18: 3000 [IU] via INTRAVENOUS
  Administered 2023-01-18: 14000 [IU] via INTRAVENOUS
  Administered 2023-01-18: 4000 [IU] via INTRAVENOUS

## 2023-01-18 MED ORDER — HEPARIN SODIUM (PORCINE) 1000 UNIT/ML IJ SOLN
INTRAMUSCULAR | Status: DC | PRN
  Administered 2023-01-18: 1000 [IU] via INTRAVENOUS

## 2023-01-18 MED ORDER — SODIUM CHLORIDE 0.9 % IV SOLN: 250.0000 mL | INTRAVENOUS | Status: DC | PRN

## 2023-01-18 MED ORDER — FENTANYL CITRATE (PF) 100 MCG/2ML IJ SOLN
INTRAMUSCULAR | Status: DC | PRN
  Administered 2023-01-18: 100 ug via INTRAVENOUS

## 2023-01-18 MED ORDER — PHENYLEPHRINE HCL-NACL 20-0.9 MG/250ML-% IV SOLN
INTRAVENOUS | Status: DC | PRN
  Administered 2023-01-18: 15 ug/min via INTRAVENOUS

## 2023-01-18 MED ORDER — COLCHICINE 0.6 MG PO TABS
0.6000 mg | ORAL_TABLET | Freq: Two times a day (BID) | ORAL | 0 refills | Status: DC
  Filled 2023-01-18: qty 10, 5d supply, fill #0

## 2023-01-18 MED ORDER — DEXAMETHASONE SODIUM PHOSPHATE 10 MG/ML IJ SOLN
INTRAMUSCULAR | Status: DC | PRN
  Administered 2023-01-18: 10 mg via INTRAVENOUS

## 2023-01-18 MED ORDER — PROTAMINE SULFATE 10 MG/ML IV SOLN
INTRAVENOUS | Status: DC | PRN
  Administered 2023-01-18: 40 mg via INTRAVENOUS

## 2023-01-18 MED ORDER — SODIUM CHLORIDE 0.9% FLUSH: 3.0000 mL | INTRAVENOUS | Status: DC | PRN

## 2023-01-18 MED ORDER — LIDOCAINE 2% (20 MG/ML) 5 ML SYRINGE
INTRAMUSCULAR | Status: DC | PRN
  Administered 2023-01-18: 100 mg via INTRAVENOUS

## 2023-01-18 MED ORDER — HEPARIN (PORCINE) IN NACL 1000-0.9 UT/500ML-% IV SOLN
INTRAVENOUS | Status: DC | PRN
  Administered 2023-01-18 (×4): 500 mL

## 2023-01-18 MED ORDER — MIDAZOLAM HCL 2 MG/2ML IJ SOLN
INTRAMUSCULAR | Status: DC | PRN
  Administered 2023-01-18: 2 mg via INTRAVENOUS

## 2023-01-18 MED ORDER — PHENYLEPHRINE 80 MCG/ML (10ML) SYRINGE FOR IV PUSH (FOR BLOOD PRESSURE SUPPORT)
PREFILLED_SYRINGE | INTRAVENOUS | Status: DC | PRN
  Administered 2023-01-18: 80 ug via INTRAVENOUS

## 2023-01-18 MED ORDER — ONDANSETRON HCL 4 MG/2ML IJ SOLN
INTRAMUSCULAR | Status: DC | PRN
  Administered 2023-01-18: 4 mg via INTRAVENOUS

## 2023-01-18 MED ORDER — HEPARIN SODIUM (PORCINE) 1000 UNIT/ML IJ SOLN
INTRAMUSCULAR | Status: AC
  Filled 2023-01-18: qty 10

## 2023-01-18 MED ORDER — PROPOFOL 10 MG/ML IV BOLUS
INTRAVENOUS | Status: DC | PRN
  Administered 2023-01-18: 150 mg via INTRAVENOUS

## 2023-01-18 MED ORDER — SODIUM CHLORIDE 0.9 % IV SOLN: INTRAVENOUS | Status: DC

## 2023-01-18 MED ORDER — SUGAMMADEX SODIUM 200 MG/2ML IV SOLN
INTRAVENOUS | Status: DC | PRN
  Administered 2023-01-18: 200 mg via INTRAVENOUS

## 2023-01-18 MED ORDER — ROCURONIUM BROMIDE 10 MG/ML (PF) SYRINGE
PREFILLED_SYRINGE | INTRAVENOUS | Status: DC | PRN
  Administered 2023-01-18: 60 mg via INTRAVENOUS

## 2023-01-18 SURGICAL SUPPLY — 20 items
CATH 8FR REPROCESSED SOUNDSTAR (CATHETERS) ×1 IMPLANT
CATH 8FR SOUNDSTAR REPROCESSED (CATHETERS) IMPLANT
CATH ABLAT QDOT MICRO BI TC DF (CATHETERS) IMPLANT
CATH OCTARAY 2.0 F 3-3-3-3-3 (CATHETERS) IMPLANT
CATH PIGTAIL STEERABLE D1 8.7 (WIRE) IMPLANT
CATH S-M CIRCA TEMP PROBE (CATHETERS) IMPLANT
CATH WEBSTER BI DIR CS D-F CRV (CATHETERS) IMPLANT
CLOSURE PERCLOSE PROSTYLE (VASCULAR PRODUCTS) IMPLANT
COVER SWIFTLINK CONNECTOR (BAG) ×1 IMPLANT
MAT PREVALON FULL STRYKER (MISCELLANEOUS) IMPLANT
PACK EP LATEX FREE (CUSTOM PROCEDURE TRAY) ×1
PACK EP LF (CUSTOM PROCEDURE TRAY) ×1 IMPLANT
PAD DEFIB RADIO PHYSIO CONN (PAD) ×1 IMPLANT
PATCH CARTO3 (PAD) IMPLANT
SHEATH CARTO VIZIGO SM CVD (SHEATH) IMPLANT
SHEATH PINNACLE 7F 10CM (SHEATH) IMPLANT
SHEATH PINNACLE 8F 10CM (SHEATH) IMPLANT
SHEATH PINNACLE 9F 10CM (SHEATH) IMPLANT
SHEATH PROBE COVER 6X72 (BAG) IMPLANT
TUBING SMART ABLATE COOLFLOW (TUBING) IMPLANT

## 2023-01-18 NOTE — Interval H&P Note (Signed)
History and Physical Interval Note:  01/18/2023 9:43 AM  Stephen Scott  has presented today for surgery, with the diagnosis of AFIB.  The various methods of treatment have been discussed with the patient and family. After consideration of risks, benefits and other options for treatment, the patient has consented to  Procedure(s): ATRIAL FIBRILLATION ABLATION (N/A) as a surgical intervention.  The patient's history has been reviewed, patient examined, no change in status, stable for surgery.  I have reviewed the patient's chart and labs.  Questions were answered to the patient's satisfaction.     Nielle Duford [employer]

## 2023-01-18 NOTE — Transfer of Care (Signed)
Immediate Anesthesia Transfer of Care Note  Patient: Stephen Scott  Procedure(s) Performed: ATRIAL FIBRILLATION ABLATION  Patient Location: Cath Lab  Anesthesia Type:General  Level of Consciousness: awake, alert , and oriented  Airway & Oxygen Therapy: Patient Spontanous Breathing and Patient connected to nasal cannula oxygen  Post-op Assessment: Report given to RN and Post -op Vital signs reviewed and stable  Post vital signs: Reviewed and stable  Last Vitals:  Vitals Value Taken Time  BP 133/65 01/18/23 1314  Temp 36.8 C 01/18/23 1311  Pulse 75 01/18/23 1314  Resp 17 01/18/23 1314  SpO2 95 % 01/18/23 1314  Vitals shown include unvalidated device data.  Last Pain:  Vitals:   01/18/23 1311  TempSrc: Temporal  PainSc: 4          Complications: There were no known notable events for this encounter.

## 2023-01-18 NOTE — Anesthesia Procedure Notes (Signed)
Procedure Name: Intubation Date/Time: 01/18/2023 10:28 AM  Performed by: Maxine Glenn, CRNAPre-anesthesia Checklist: Patient identified, Emergency Drugs available, Suction available and Patient being monitored Patient Re-evaluated:Patient Re-evaluated prior to induction Oxygen Delivery Method: Circle System Utilized Preoxygenation: Pre-oxygenation with 100% oxygen Induction Type: IV induction Ventilation: Mask ventilation without difficulty and Oral airway inserted - appropriate to patient size Laryngoscope Size: Mac and 4 Grade View: Grade I Tube type: Oral Tube size: 7.5 mm Number of attempts: 1 Airway Equipment and Method: Stylet and Oral airway Placement Confirmation: ETT inserted through vocal cords under direct vision, positive ETCO2 and breath sounds checked- equal and bilateral Secured at: 22 cm Tube secured with: Tape Dental Injury: Teeth and Oropharynx as per pre-operative assessment

## 2023-01-18 NOTE — Discharge Instructions (Signed)

## 2023-01-18 NOTE — Anesthesia Preprocedure Evaluation (Addendum)
Anesthesia Evaluation  Patient identified by MRN, date of birth, ID band Patient awake    Reviewed: Allergy & Precautions, NPO status , Patient's Chart, lab work & pertinent test results, reviewed documented beta blocker date and time   History of Anesthesia Complications Negative for: history of anesthetic complications  Airway Mallampati: II  TM Distance: >3 FB Neck ROM: Full    Dental  (+) Dental Advisory Given, Teeth Intact, Chipped   Pulmonary sleep apnea    Pulmonary exam normal        Cardiovascular hypertension, Pt. on medications and Pt. on home beta blockers + CAD and + Cardiac Stents  Normal cardiovascular exam+ dysrhythmias Atrial Fibrillation    '23 Myoperfusion -   The study is normal. The study is low risk.   No ST deviation was noted.   Left ventricular function is normal. End diastolic cavity size is normal.   Prior study not available for comparison.  '22 TTE - EF 60 to 65%. Grade I diastolic dysfunction (impaired relaxation). Left atrial size was mildly dilated.     Neuro/Psych  Headaches PSYCHIATRIC DISORDERS Anxiety Depression     Neuromuscular disease    GI/Hepatic Neg liver ROS,GERD  Controlled,,  Endo/Other   Pre-DM   Renal/GU negative Renal ROS     Musculoskeletal  (+) Arthritis ,    Abdominal   Peds  Hematology  On xarelto    Anesthesia Other Findings On GLP-1a   Reproductive/Obstetrics                             Anesthesia Physical Anesthesia Plan  ASA: 3  Anesthesia Plan: General   Post-op Pain Management: Tylenol PO (pre-op)*   Induction: Intravenous  PONV Risk Score and Plan: 2 and Treatment may vary due to age or medical condition, Ondansetron, Dexamethasone and Midazolam  Airway Management Planned: Oral ETT  Additional Equipment: None  Intra-op Plan:   Post-operative Plan: Extubation in OR  Informed Consent: I have reviewed the  patients History and Physical, chart, labs and discussed the procedure including the risks, benefits and alternatives for the proposed anesthesia with the patient or authorized representative who has indicated his/her understanding and acceptance.     Dental advisory given  Plan Discussed with: CRNA and Anesthesiologist  Anesthesia Plan Comments:        Anesthesia Quick Evaluation

## 2023-01-18 NOTE — Progress Notes (Signed)
Patient and wife was given discharge instructions. Both verbalized understanding. 

## 2023-01-18 NOTE — Anesthesia Postprocedure Evaluation (Signed)
Anesthesia Post Note  Patient: Stephen Scott  Procedure(s) Performed: ATRIAL FIBRILLATION ABLATION     Patient location during evaluation: PACU Anesthesia Type: General Level of consciousness: awake and alert Pain management: pain level controlled Vital Signs Assessment: post-procedure vital signs reviewed and stable Respiratory status: spontaneous breathing, nonlabored ventilation and respiratory function stable Cardiovascular status: stable and blood pressure returned to baseline Anesthetic complications: no   There were no known notable events for this encounter.  Last Vitals:  Vitals:   01/18/23 1530 01/18/23 1600  BP: (!) 140/73 (!) 140/77  Pulse: 76 72  Resp: (!) 22 (!) 24  Temp:    SpO2: 96% 96%    Last Pain:  Vitals:   01/18/23 1311  TempSrc: Temporal  PainSc: 4                  Beryle Lathe

## 2023-01-19 ENCOUNTER — Encounter (HOSPITAL_COMMUNITY): Payer: Self-pay | Admitting: Cardiology

## 2023-01-24 ENCOUNTER — Other Ambulatory Visit: Payer: Self-pay

## 2023-01-24 MED ORDER — ATORVASTATIN CALCIUM 80 MG PO TABS: 80.0000 mg | ORAL_TABLET | Freq: Every day | ORAL | 3 refills | Status: AC

## 2023-01-28 ENCOUNTER — Telehealth: Payer: Self-pay | Admitting: Cardiology

## 2023-01-28 NOTE — Telephone Encounter (Signed)
Left message to discuss

## 2023-01-28 NOTE — Telephone Encounter (Signed)
Patient c/o Palpitations:  High priority if patient c/o lightheadedness, shortness of breath, or chest pain  How long have you had palpitations/irregular HR/ Afib? Patient states he is still in A-fib, he had an ablation week, last Tuesday 01/18/23. Are you having the symptoms now?  Yes   Are you currently experiencing lightheadedness, SOB or CP? no  Do you have a history of afib (atrial fibrillation) or irregular heart rhythm? yes  Have you checked your BP or HR? (document readings if available): HR in the morning has been as high as 150.  This morning was 110-120.    Are you experiencing any other symptoms? He said he is still having fluttering.  He said he has no energy is exteremly fatigued.   He said last night he got home at 6pm and went straight to bed and slept until 5-6am this morning.  He said he woke up a couple of times, but didn't get out of bed until this morning he was just so tired.  He knows he was told that he still might be in A-fib for the next 2-3 month even after the ablation, but he wants to know if this is something normal.  He stated at times he has experienced some lightheaded and SOB, but is not having that right now.  He states he also has had diarrhea for the past 4-5 day, he is not sure if that is from starting back up the Ozempic after being off of it for a couple of weeks.

## 2023-01-28 NOTE — Telephone Encounter (Signed)
Patient had A-fib ablation on 4/23, his HR is still between 110/120's and is currently experiencing headaches and extreme fatigue. Yesterday pt mentioned his fatigue was so extreme that he could not drive, had to cancel all of his meetings, and slept 12 hours. Pt mentioned he has restarted ozempic on 4/28, for the past 3 days he has experiencing diarrhea. Advised pt MD is not in the office today. Will forward to A-Fib clinic advise.

## 2023-02-15 ENCOUNTER — Ambulatory Visit (HOSPITAL_COMMUNITY): Payer: Managed Care, Other (non HMO) | Admitting: Physician Assistant

## 2023-04-22 ENCOUNTER — Encounter: Payer: Self-pay | Admitting: Cardiology

## 2023-04-22 ENCOUNTER — Ambulatory Visit: Payer: Managed Care, Other (non HMO) | Attending: Cardiology | Admitting: Cardiology

## 2023-04-22 VITALS — BP 114/70 | HR 69 | Ht 71.0 in | Wt 234.0 lb

## 2023-04-22 DIAGNOSIS — I480 Paroxysmal atrial fibrillation: Secondary | ICD-10-CM

## 2023-04-22 DIAGNOSIS — I2510 Atherosclerotic heart disease of native coronary artery without angina pectoris: Secondary | ICD-10-CM | POA: Diagnosis not present

## 2023-04-22 DIAGNOSIS — D6869 Other thrombophilia: Secondary | ICD-10-CM

## 2023-04-22 MED ORDER — METOPROLOL TARTRATE 25 MG PO TABS: 12.5000 mg | ORAL_TABLET | ORAL | 2 refills | Status: DC | PRN

## 2023-04-22 MED ORDER — RIVAROXABAN 20 MG PO TABS: 20.0000 mg | ORAL_TABLET | Freq: Every day | ORAL | 6 refills | Status: DC

## 2023-04-22 MED ORDER — METOPROLOL SUCCINATE ER 50 MG PO TB24: 75.0000 mg | ORAL_TABLET | Freq: Every day | ORAL | 3 refills | Status: DC

## 2023-04-22 MED ORDER — MULTAQ 400 MG PO TABS: 400.0000 mg | ORAL_TABLET | Freq: Two times a day (BID) | ORAL | 6 refills | Status: DC

## 2023-04-22 NOTE — Progress Notes (Signed)
Electrophysiology Office Note:   Date:  04/22/2023  ID:  Stephen Scott, DOB 1961/04/27, MRN [id number]  Primary Cardiologist: Peter Swaziland, MD Electrophysiologist: Regan Lemming, MD      History of Present Illness:   Stephen Scott is a 62 y.o. male with h/o atrial fibrillation seen today for routine electrophysiology followup.  Since last being seen in our clinic the patient reports continued episodes of atrial fibrillation.  He feels multiple episodes a week.  He has symptoms of fatigue, shortness of breath, palpitations.  He states that his heart rates are somewhat rapid during these episodes.  He is under quite a bit of stress.  He is stressed both at work and at home.  Him and his wife are now separated.  he denies chest pain, palpitations, dyspnea, PND, orthopnea, nausea, vomiting, dizziness, syncope, edema, weight gain, or early satiety.     He has a history seen for coronary artery disease, hyperlipidemia, hypertension, prediabetes, atrial fibrillation.  Atrial fibrillation was diagnosed in 2022 with symptoms of palpitations, restlessness, fatigue.  He was seen in the emergency room 11/13/2022 with atrial flutter.  He is now status post ablation 01/18/2023.     Review of systems complete and found to be negative unless listed in HPI.   EP Information / Studies Reviewed:    EKG is ordered today. Personal review as below.  EKG Interpretation Date/Time:  Friday April 22 2023 16:28:53 EDT Ventricular Rate:  69 PR Interval:  152 QRS Duration:  88 QT Interval:  428 QTC Calculation: 458 R Axis:   70  Text Interpretation: Normal sinus rhythm Normal ECG When compared with ECG of 18-Jan-2023 13:23, No significant change was found Confirmed by Stephen Scott ([id number]) on 04/22/2023 4:35:04 PM     Risk Assessment/Calculations:    CHA2DS2-VASc Score = 2   This indicates a 2.2% annual risk of stroke. The patient's score is based upon: CHF History: 0 HTN History: 1 Diabetes History: 0  (prediabetes) Stroke History: 0 Vascular Disease History: 1 Age Score: 0 Gender Score: 0        STOP-Bang Score:          Physical Exam:   VS:  BP 114/70 (BP Location: Left Arm, Patient Position: Sitting, Cuff Size: Large)   Pulse 69   Ht 5\' 11"  (1.803 m)   Wt 234 lb (106.1 kg)   SpO2 97%   BMI 32.64 kg/m    Wt Readings from Last 3 Encounters:  04/22/23 234 lb (106.1 kg)  01/18/23 240 lb (108.9 kg)  12/29/22 243 lb (110.2 kg)     GEN: Well nourished, well developed in no acute distress NECK: No JVD; No carotid bruits CARDIAC: Regular rate and rhythm, no murmurs, rubs, gallops RESPIRATORY:  Clear to auscultation without rales, wheezing or rhonchi  ABDOMEN: Soft, non-tender, non-distended EXTREMITIES:  No edema; No deformity   ASSESSMENT AND PLAN:    1.  Paroxysmal atrial fibrillation/flutter: Currently on metoprolol and Xarelto.  Status post ablation 01/18/2023.  He is unfortunately continued to have episodes of atrial fibrillation and atrial flutter.  To confirm which arrhythmia he is having, we Stephen Scott have him wear a 2-week monitor.  In the interim, he Stephen Scott continue his metoprolol and Multaq.  2.  Secondary hypercoagulable state: Currently on Xarelto for atrial fibrillation  3.  Coronary artery disease: Status post circumflex stent.  Low risk Myoview.  Plan per primary cardiology.  4.  Obesity: Lifestyle modification encouraged  Follow up with Dr. Elberta Fortis  pending  monitor results   Signed, Stephen Pember Jorja Loa, MD

## 2023-04-22 NOTE — Patient Instructions (Addendum)
Medication Instructions:  Your physician recommends that you continue on your current medications as directed. Please refer to the Current Medication list given to you today.  *If you need a refill on your cardiac medications before your next appointment, please call your pharmacy*   Lab Work: None ordered If you have labs (blood work) drawn today and your tests are completely normal, you will receive your results only by: MyChart Message (if you have MyChart) OR A paper copy in the mail If you have any lab test that is abnormal or we need to change your treatment, we will call you to review the results.   Testing/Procedures:                           Stephen Scott- Long Term Monitor Instructions  Your physician has requested you wear a ZIO patch monitor for 14 days.  This is a single patch monitor. [employer] supplies one patch monitor per enrollment. Additional stickers are not available. Please do not apply patch if you will be having a Nuclear Stress Test,  Echocardiogram, Cardiac CT, MRI, or Chest Xray during the period you would be wearing the  monitor. The patch cannot be worn during these tests. You cannot remove and re-apply the  ZIO XT patch monitor.  Your ZIO patch monitor will be mailed 3 day USPS to your address on file. It may take 3-5 days  to receive your monitor after you have been enrolled.  Once you have received your monitor, please review the enclosed instructions. Your monitor  has already been registered assigning a specific monitor serial # to you.  Billing and Patient Assistance Program Information  We have supplied [employer] with any of your insurance information on file for billing purposes. [employer] offers a sliding scale Patient Assistance Program for patients that do not have  insurance, or whose insurance does not completely cover the cost of the ZIO monitor.  You must apply for the Patient Assistance Program to qualify for this discounted rate.  To apply, please  call [employer] at [phone], select option 4, select option 2, ask to apply for  Patient Assistance Program. Meredeth Ide will ask your household income, and how many people  are in your household. They will quote your out-of-pocket cost based on that information.  [employer] will also be able to set up a 75-month, interest-free payment plan if needed.  Applying the monitor   Shave hair from upper left chest.  Hold abrader disc by orange tab. Rub abrader in 40 strokes over the upper left chest as  indicated in your monitor instructions.  Clean area with 4 enclosed alcohol pads. Let dry.  Apply patch as indicated in monitor instructions. Patch will be placed under collarbone on left  side of chest with arrow pointing upward.  Rub patch adhesive wings for 2 minutes. Remove white label marked "1". Remove the white  label marked "2". Rub patch adhesive wings for 2 additional minutes.  While looking in a mirror, press and release button in center of patch. A small green light will  flash 3-4 times. This will be your only indicator that the monitor has been turned on.  Do not shower for the first 24 hours. You may shower after the first 24 hours.  Press the button if you feel a symptom. You will hear a small click. Record Date, Time and  Symptom in the Patient Logbook.  When you are ready to remove the  patch, follow instructions on the last 2 pages of Patient  Logbook. Stick patch monitor onto the last page of Patient Logbook.  Place Patient Logbook in the blue and white box. Use locking tab on box and tape box closed  securely. The blue and white box has prepaid postage on it. Please place it in the mailbox as  soon as possible. Your physician should have your test results approximately 7 days after the  monitor has been mailed back to [facility].  Call [facility] Customer Care at [phone] if you have questions regarding  your ZIO XT patch monitor. Call them immediately if you see an  orange light blinking on your  monitor.  If your monitor falls off in less than 4 days, contact our Monitor department at [phone].  If your monitor becomes loose or falls off after 4 days call [employer] at [phone] for  suggestions on securing your monitor   Follow-Up: At [facility], you and your health needs are our priority.  As part of our continuing mission to provide you with exceptional heart care, we have created designated Provider Care Teams.  These Care Teams include your primary Cardiologist (physician) and Advanced Practice Providers (APPs -  Physician Assistants and Nurse Practitioners) who all work together to provide you with the care you need, when you need it.  Your next appointment:   to  be determined  The format for your next appointment:   In Person  Provider:   Loman Brooklyn, MD{   Thank you for choosing [facility]!!   Dory Horn, RN [phone]  Other Instructions

## 2023-04-25 ENCOUNTER — Ambulatory Visit: Payer: Managed Care, Other (non HMO) | Attending: Cardiology

## 2023-04-25 DIAGNOSIS — I480 Paroxysmal atrial fibrillation: Secondary | ICD-10-CM

## 2023-04-25 NOTE — Progress Notes (Unsigned)
Enrolled for [employer] to mail a ZIO XT long term holter monitor to the patients address on file.  

## 2023-05-10 ENCOUNTER — Other Ambulatory Visit: Payer: Self-pay

## 2023-09-11 ENCOUNTER — Other Ambulatory Visit: Payer: Self-pay | Admitting: Cardiology

## 2023-11-30 ENCOUNTER — Other Ambulatory Visit: Payer: Self-pay | Admitting: Cardiology

## 2024-01-09 NOTE — Progress Notes (Unsigned)
 Electrophysiology Office Note:   Date:  01/10/2024  ID:  Gaylan Fauver, DOB 11/15/1960, MRN [id number]  Primary Cardiologist: Peter Swaziland, MD Primary Heart Failure: None Electrophysiologist: Isador Castille Cortland Ding, MD      History of Present Illness:   Brolin Dambrosia is a 63 y.o. male with h/o coronary artery disease, hyperlipidemia, hypertension, prediabetes, atrial fibrillation seen today for routine electrophysiology followup.   Since last being seen in our clinic the patient reports continued episodes of atrial fibrillation and flutter.  He does not have chest pain, but does have mild shortness of breath, neck discomfort, fatigue when he is in atrial fibrillation.  He finds it difficult to do his daily activities due to his level of fatigue.  He also has multiple issues going on at home, going through a separation with his wife.  He feels that this also adds to his fatigue and level of stress.  he denies chest pain, palpitations, dyspnea, PND, orthopnea, nausea, vomiting, dizziness, syncope, edema, weight gain, or early satiety.   Review of systems complete and found to be negative unless listed in HPI.   EP Information / Studies Reviewed:    EKG is ordered today. Personal review as below.  EKG Interpretation Date/Time:  Tuesday January 10 2024 08:15:02 EDT Ventricular Rate:  100 PR Interval:    QRS Duration:  90 QT Interval:  354 QTC Calculation: 456 R Axis:   87  Text Interpretation: Atrial flutter with variable A-V block When compared with ECG of 22-Apr-2023 16:28, Atrial flutter has replaced Sinus rhythm Confirmed by Tyquavious Gamel ([id number]) on 01/10/2024 8:15:38 AM     Risk Assessment/Calculations:    CHA2DS2-VASc Score = 2   This indicates a 2.2% annual risk of stroke. The patient's score is based upon: CHF History: 0 HTN History: 1 Diabetes History: 0 Stroke History: 0 Vascular Disease History: 1 Age Score: 0 Gender Score: 0       STOP-Bang Score:          Physical  Exam:   VS:  BP 116/70 (BP Location: Left Arm, Patient Position: Sitting, Cuff Size: Large)   Pulse 100   Ht 5\' 11"  (1.803 m)   Wt 213 lb (96.6 kg)   SpO2 98%   BMI 29.71 kg/m    Wt Readings from Last 3 Encounters:  01/10/24 213 lb (96.6 kg)  04/22/23 234 lb (106.1 kg)  01/18/23 240 lb (108.9 kg)     GEN: Well nourished, well developed in no acute distress NECK: No JVD; No carotid bruits CARDIAC: Irregularly irregular rate and rhythm, no murmurs, rubs, gallops RESPIRATORY:  Clear to auscultation without rales, wheezing or rhonchi  ABDOMEN: Soft, non-tender, non-distended EXTREMITIES:  No edema; No deformity   ASSESSMENT AND PLAN:    1.  Paroxysmal atrial fibrillation/flutter: Currently on metoprolol.  Post ablation 01/18/2023.  Had more episodes of atrial fibrillation and atrial flutter and is now on Multaq.  He is in atrial flutter today.  Jaye Polidori stop his Multaq.  He has not been compliant with his anticoagulation.  Cimone Fahey refill his Xarelto today.  Jahad Old also increase his Toprol-XL to 100 mg daily for improved rate control.  In the long run, he has agreed to ablation.  Galilee Pierron have him follow-up in A-fib clinic in 3 weeks after he has been anticoagulated.  He may wish to start amiodarone and cardiovert prior to ablation.  Risk, benefits, and alternatives to EP study and radiofrequency/pulse field ablation for afib were also discussed in detail today. These  risks include but are not limited to stroke, bleeding, vascular damage, tamponade, perforation, damage to the esophagus, lungs, and other structures, pulmonary vein stenosis, worsening renal function, and death. The patient understands these risk and wishes to proceed.  We Ripken Rekowski therefore proceed with catheter ablation at the next available time.  Carto, ICE, anesthesia are requested for the procedure.  Geovanny Sartin also obtain CT PV protocol prior to the procedure to exclude LAA thrombus and further evaluate atrial anatomy.  2.  Secondary  hypercoagulable state: Currently on Xarelto for atrial fibrillation  3.  Coronary artery disease: Post circumflex stent.  Low risk Myoview.  Plan per primary cardiology.  4.  Obesity: Lifestyle modification encouraged  Follow up with Afib Clinic in 4 weeks  Signed, Sanchez Hemmer Cortland Ding, MD

## 2024-01-10 ENCOUNTER — Ambulatory Visit: Payer: Managed Care, Other (non HMO) | Attending: Cardiology | Admitting: Cardiology

## 2024-01-10 ENCOUNTER — Encounter: Payer: Self-pay | Admitting: Cardiology

## 2024-01-10 VITALS — BP 116/70 | HR 100 | Ht 71.0 in | Wt 213.0 lb

## 2024-01-10 DIAGNOSIS — Z01812 Encounter for preprocedural laboratory examination: Secondary | ICD-10-CM

## 2024-01-10 DIAGNOSIS — I4892 Unspecified atrial flutter: Secondary | ICD-10-CM

## 2024-01-10 DIAGNOSIS — I480 Paroxysmal atrial fibrillation: Secondary | ICD-10-CM

## 2024-01-10 DIAGNOSIS — Z79899 Other long term (current) drug therapy: Secondary | ICD-10-CM

## 2024-01-10 MED ORDER — METOPROLOL SUCCINATE ER 100 MG PO TB24: 100.0000 mg | ORAL_TABLET | Freq: Every day | ORAL | 3 refills | Status: AC

## 2024-01-10 MED ORDER — METOPROLOL TARTRATE 25 MG PO TABS: 12.5000 mg | ORAL_TABLET | ORAL | Status: AC | PRN

## 2024-01-10 MED ORDER — RIVAROXABAN 20 MG PO TABS
20.0000 mg | ORAL_TABLET | Freq: Every day | ORAL | 6 refills | Status: DC
Start: 2024-01-10 — End: 2024-03-28

## 2024-01-10 NOTE — Patient Instructions (Signed)
 Medication Instructions:  Your physician has recommended you make the following change in your medication:   ** Stop Multaq  ** Stop Metoprolol Succinate 75mg   ** Begin Metoprolol Succinate 1000mg  - 1 tablet by mouth daily  *If you need a refill on your cardiac medications before your next appointment, please call your pharmacy*  Lab Work: CBC and BMET - please have pre-procedure lab work completed on Tuesday, 03/13/2024 . This can be done at ANY [facility] near you - no appointment required and this does not have to be fasting. If you have labs (blood work) drawn today and your tests are completely normal, you will receive your results only by: MyChart Message (if you have MyChart) OR A paper copy in the mail If you have any lab test that is abnormal or we need to change your treatment, we will call you to review the results.  Testing/Procedures: Cardiac CT - someone will contact you to schedule this  Your physician has requested that you have cardiac CT. Cardiac computed tomography (CT) is a painless test that uses an x-ray machine to take clear, detailed pictures of your heart. For further information please visit [email or web address] Please follow instruction sheet as given.   Atrial Fibrillation Ablation - scheduled on Tuesday 04/03/2024 We will be in contact closer to your ablation date with further instructions Your physician has recommended that you have an ablation. Catheter ablation is a medical procedure used to treat some cardiac arrhythmias (irregular heartbeats). During catheter ablation, a long, thin, flexible tube is put into a blood vessel in your groin (upper thigh), or neck. This tube is called an ablation catheter. It is then guided to your heart through the blood vessel. Radio frequency waves destroy small areas of heart tissue where abnormal heartbeats may cause an arrhythmia to start. Please see the instruction sheet given to you today.  Follow-Up: At [facility], you and your health needs are our priority.  As part of our continuing mission to provide you with exceptional heart care, our providers are all part of one team.  This team includes your primary Cardiologist (physician) and Advanced Practice Providers or APPs (Physician Assistants and Nurse Practitioners) who all work together to provide you with the care you need, when you need it.  Your next appointment:   3 weeks with Afib Clinic We will schedule follow up after your ablation  Provider:   Dr Elberta Fortis   Cardiac Ablation Cardiac ablation is a procedure to destroy, or ablate, a small amount of heart tissue that is causing problems. The heart has many electrical connections. Sometimes, these connections are abnormal and can cause the heart to beat very fast or irregularly. Ablating the abnormal areas can improve the heart's rhythm or return it to normal. Ablation may be done for people who: Have irregular or rapid heartbeats (arrhythmias). Have Wolff-Parkinson-White syndrome. Have taken medicines for an arrhythmia that did not work or caused side effects. Have a high-risk heartbeat that may be life-threatening. Tell a health care provider about: Any allergies you have. All medicines you are taking, including vitamins, herbs, eye drops, creams, and over-the-counter medicines. Any problems you or family members have had with anesthesia. Any bleeding problems you have. Any surgeries you have had. Any medical conditions you have. Whether you are pregnant or may be pregnant. What are the risks? Your health care provider will talk with you about risks. These may include: Infection. Bruising and bleeding. Stroke or blood clots. Damage to nearby structures or  organs. Allergic reaction to medicines or dyes. Needing a pacemaker if the heart gets damaged. A pacemaker is a device that helps the heart beat normally. Failure of the procedure. A repeat procedure may be needed. What  happens before the procedure? Medicines Ask your health care provider about: Changing or stopping your regular medicines. These include any heart rhythm medicines, diabetes medicines, or blood thinners you take. Taking medicines such as aspirin and ibuprofen. These medicines can thin your blood. Do not take them unless your health care provider tells you to. Taking over-the-counter medicines, vitamins, herbs, and supplements. General instructions Follow instructions from your health care provider about what you may eat and drink. If you will be going home right after the procedure, plan to have a responsible adult: Take you home from the hospital or clinic. You will not be allowed to drive. Care for you for the time you are told. Ask your health care provider what steps will be taken to prevent infection. What happens during the procedure?  An IV will be inserted into one of your veins. You may be given: A sedative. This helps you relax. Anesthesia. This will: Numb certain areas of your body. An incision will be made in your neck or your groin. A needle will be inserted through the incision and into a large vein in your neck or groin. The small, thin tube (catheter) will be inserted through the needle and moved to your heart. A type of X-ray (fluoroscopy) will be used to help guide the catheter and provide images of the heart on a monitor. Dye may be injected through the catheter to help your surgeon see the area of the heart that needs treatment. Electrical currents will be sent from the catheter to destroy heart tissue in certain areas. There are three types of energy that may be used to do this: Heat (radiofrequency energy). Laser energy. Extreme cold (cryoablation). When the tissue has been destroyed, the catheter will be removed. Pressure will be held on the insertion area to prevent bleeding. A bandage (dressing) will be placed over the insertion area. The procedure may vary  among health care providers and hospitals. What happens after the procedure? Your blood pressure, heart rate and rhythm, breathing rate, and blood oxygen level will be monitored until you leave the hospital or clinic. Your insertion area will be checked for bleeding. You will need to lie still for a few hours. If your groin was used, you will need to keep your leg straight for a few hours after the catheter is removed. This information is not intended to replace advice given to you by your health care provider. Make sure you discuss any questions you have with your health care provider. Document Revised: 03/02/2022 Document Reviewed: 03/02/2022 Elsevier Patient Education  2024 [employer].

## 2024-01-15 ENCOUNTER — Other Ambulatory Visit: Payer: Self-pay | Admitting: Cardiology

## 2024-02-02 ENCOUNTER — Ambulatory Visit (HOSPITAL_COMMUNITY)
Admission: RE | Admit: 2024-02-02 | Discharge: 2024-02-02 | Disposition: A | Discharge status: Home / Self Care | Source: Ambulatory Visit | Attending: Physician Assistant | Admitting: Physician Assistant

## 2024-02-02 ENCOUNTER — Encounter (HOSPITAL_COMMUNITY): Payer: Self-pay | Admitting: Physician Assistant

## 2024-02-02 VITALS — BP 98/60 | HR 163 | Ht 71.0 in | Wt 211.4 lb

## 2024-02-02 DIAGNOSIS — I480 Paroxysmal atrial fibrillation: Secondary | ICD-10-CM | POA: Diagnosis not present

## 2024-02-02 DIAGNOSIS — I483 Typical atrial flutter: Secondary | ICD-10-CM

## 2024-02-02 DIAGNOSIS — D6869 Other thrombophilia: Secondary | ICD-10-CM

## 2024-02-02 DIAGNOSIS — I10 Essential (primary) hypertension: Secondary | ICD-10-CM | POA: Diagnosis not present

## 2024-02-02 MED ORDER — AMIODARONE HCL 200 MG PO TABS: ORAL_TABLET | ORAL | 1 refills | Status: DC

## 2024-02-02 NOTE — Patient Instructions (Signed)
 Hold Ozempic for now  Decrease losartan  to 1/2 tablet once a day (50mg )   Start Amiodarone 200mg  twice a day for the next 30 days then reduce to once a day = take with food

## 2024-02-02 NOTE — Progress Notes (Signed)
 Primary Care Physician: Dorena Gander, MD Primary Cardiologist: Dr Swaziland Primary Electrophysiologist: Dr Lawana Pray  Referring Physician: Dr Assunta Blalock Segur is a 63 y.o. male with a history of CAD, HLD, HTN, prediabetes, atrial flutter, atrial fibrillation who presents for follow up in the [facility].  The patient was initially diagnosed with atrial fibrillation on an event monitor in 2022.  When in afib he has symptoms of "heart pounding", "restlessness", and fatigue. Patient was seen at the ED 11/13/22 with rapid atrial flutter. He was given IV metoprolol  which slowed him to atrial flutter with variable block. His home BB was increased (he was only taking 50 mg). He underwent afib and flutter ablation with Dr Lawana Pray on 01/18/23. He has continued to have recurrent afib and atrial flutter and is scheduled for repeat ablation on 04/03/24.  Patient returns for follow up for atrial fibrillation and atrial flutter. He is in atrial flutter with symptoms of tachypalpitations and shoulder pain. These symptoms started this AM. He denies any missed doses of Xarelto .   Today, he  denies symptoms of shortness of breath, orthopnea, PND, lower extremity edema, dizziness, presyncope, syncope, bleeding, or neurologic sequela. The patient is tolerating medications without difficulties and is otherwise without complaint today.    Atrial Fibrillation Risk Factors:  he does have symptoms or diagnosis of sleep apnea. he does not have a history of rheumatic fever. he does have a history of alcohol use. The patient does not have a history of early familial atrial fibrillation or other arrhythmias.   Atrial Fibrillation Management history:  Previous antiarrhythmic drugs: Multaq  Previous cardioversions: none Previous ablations: 01/18/23 Anticoagulation history: Eliquis , Xarelto     Past Medical History:  Diagnosis Date   Anemia    as a small child   Anxiety    Arthritis     "lower spine; knees" (11/01/2017)   Chronic lower back pain    Depression    GERD (gastroesophageal reflux disease)    Headache    High cholesterol    History of kidney stones    Hypertension    Pneumonia 06/2017; 08/2017   walking pneumonia; treated   Spinal stenosis    "lower back" (11/01/2017)    Current Outpatient Medications  Medication Sig Dispense Refill   acetaminophen  (TYLENOL ) 500 MG tablet Take 500-1,000 mg by mouth every 6 (six) hours as needed for moderate pain (pain score 4-6).     albuterol (VENTOLIN HFA) 108 (90 Base) MCG/ACT inhaler      allopurinol (ZYLOPRIM) 300 MG tablet Take 300 mg by mouth daily.     atorvastatin  (LIPITOR ) 80 MG tablet Take 1 tablet (80 mg total) by mouth daily. 90 tablet 3   chlorthalidone  (HYGROTON ) 25 MG tablet TAKE 1 TABLET(25 MG) BY MOUTH DAILY 30 tablet 0   CONTOUR NEXT TEST test strip as directed.     diphenhydrAMINE  (BENADRYL ) 50 MG capsule Take one capsule 1 hour prior to scan. 1 capsule 0   DULoxetine (CYMBALTA) 30 MG capsule Take 30 mg by mouth daily.     hydrOXYzine (ATARAX) 25 MG tablet Take 25-50 mg by mouth at bedtime as needed.     loratadine  (CLARITIN ) 10 MG tablet Take 10 mg by mouth daily as needed for allergies.     losartan  (COZAAR ) 100 MG tablet Take 100 mg by mouth daily.     metoprolol  succinate (TOPROL -XL) 100 MG 24 hr tablet Take 1 tablet (100 mg total) by mouth daily. Take with or  immediately following a meal. 90 tablet 3   metoprolol  tartrate (LOPRESSOR ) 25 MG tablet Take 0.5 tablets (12.5 mg total) by mouth as needed.     Misc Natural Products (TART CHERRY ADVANCED PO) Take 1 capsule by mouth daily.     nitroGLYCERIN  (NITROSTAT ) 0.4 MG SL tablet Place 1 tablet (0.4 mg total) under the tongue every 5 (five) minutes as needed for chest pain. 90 tablet 3   OZEMPIC, 2 MG/DOSE, 8 MG/3ML SOPN Inject 2 mg into the skin once a week. Saturday     potassium chloride (KLOR-CON) 10 MEQ tablet Take 10 mEq by mouth daily.      predniSONE  (DELTASONE ) 20 MG tablet Take 2 tablets (40 mg total) by mouth daily. 10 tablet 0   predniSONE  (DELTASONE ) 50 MG tablet Take one tablet 13 hours, 7 hours, and 1 hour prior to scan. 3 tablet 0   rivaroxaban  (XARELTO ) 20 MG TABS tablet Take 1 tablet (20 mg total) by mouth daily with supper. 30 tablet 6   sertraline  (ZOLOFT ) 50 MG tablet Take 50 mg by mouth daily.     No current facility-administered medications for this encounter.    ROS- All systems are reviewed and negative except as per the HPI above.  Physical Exam: Vitals:   02/02/24 0905  BP: 98/60  Pulse: (!) 163  Weight: 95.9 kg  Height: 5\' 11"  (1.803 m)     GEN: Well nourished, well developed in no acute distress NECK: No JVD CARDIAC: Regular rate and rhythm, tachycardia, no murmurs, rubs, gallops RESPIRATORY:  Clear to auscultation without rales, wheezing or rhonchi  ABDOMEN: Soft, non-tender, non-distended EXTREMITIES:  No edema; No deformity    Wt Readings from Last 3 Encounters:  02/02/24 95.9 kg  01/10/24 96.6 kg  04/22/23 106.1 kg    EKG today demonstrates  Atrial flutter with 2:1 block Vent. rate 163 BPM PR interval * ms QRS duration 78 ms QT/QTcB 284/467 ms   Echo 06/30/21 demonstrated   1. Left ventricular ejection fraction, by estimation, is 60 to 65%. The  left ventricle has normal function. The left ventricle has no regional  wall motion abnormalities. Left ventricular diastolic parameters are  consistent with Grade I diastolic dysfunction (impaired relaxation).   2. Right ventricular systolic function is normal. The right ventricular  size is normal. Tricuspid regurgitation signal is inadequate for assessing PA pressure.   3. Left atrial size was mildly dilated.   4. The mitral valve is normal in structure. No evidence of mitral valve  regurgitation. No evidence of mitral stenosis.   5. The aortic valve is normal in structure. Aortic valve regurgitation is  not visualized. No aortic  stenosis is present.   6. The inferior vena cava is normal in size with greater than 50%  respiratory variability, suggesting right atrial pressure of 3 mmHg  Epic records are reviewed at length today  CHA2DS2-VASc Score = 2  The patient's score is based upon: CHF History: 0 HTN History: 1 Diabetes History: 0 Stroke History: 0 Vascular Disease History: 1 Age Score: 0 Gender Score: 0       ASSESSMENT AND PLAN: Persistent Atrial Fibrillation/atrial flutter The patient's CHA2DS2-VASc score is 2, indicating a 2.2% annual risk of stroke.   S/p afib and flutter ablation 01/18/23 Previously failed Multaq  Patient scheduled for repeat ablation 04/03/24 We discussed short term rhythm control options. Will start amiodarone 200 mg BID. If he does not chemically convert, will plan for DCCV. ED precautions given.  Continue  Xarelto  20 mg daily Continue Toprol  100 mg daily with Lopressor  12.5 mg q 6 hours PRN for heart racing.   Secondary Hypercoagulable State (ICD10:  D68.69) The patient is at significant risk for stroke/thromboembolism based upon his CHA2DS2-VASc Score of 2.  Continue Rivaroxaban  (Xarelto ). No bleeding issues.   CAD S/p DES 2021 No anginal symptoms Followed by Dr Swaziland  HTN Borderline low today Will have him cut his losartan  in half (50 mg) daily until back in SR.    Follow up in the AF clinic next week.    Myrtha Ates PA-C Afib Clinic [facility] [address] [place], [place] [address] [phone] 02/02/2024 9:14 AM

## 2024-02-07 ENCOUNTER — Ambulatory Visit (HOSPITAL_COMMUNITY)
Admission: RE | Admit: 2024-02-07 | Discharge: 2024-02-07 | Disposition: A | Discharge status: Home / Self Care | Source: Ambulatory Visit | Attending: Physician Assistant | Admitting: Physician Assistant

## 2024-02-07 ENCOUNTER — Encounter (HOSPITAL_COMMUNITY): Payer: Self-pay | Admitting: Physician Assistant

## 2024-02-07 VITALS — BP 108/74 | HR 81 | Ht 71.0 in | Wt 213.2 lb

## 2024-02-07 DIAGNOSIS — Z5181 Encounter for therapeutic drug level monitoring: Secondary | ICD-10-CM | POA: Diagnosis not present

## 2024-02-07 DIAGNOSIS — I480 Paroxysmal atrial fibrillation: Secondary | ICD-10-CM | POA: Diagnosis not present

## 2024-02-07 DIAGNOSIS — I483 Typical atrial flutter: Secondary | ICD-10-CM | POA: Diagnosis not present

## 2024-02-07 DIAGNOSIS — D6869 Other thrombophilia: Secondary | ICD-10-CM | POA: Diagnosis not present

## 2024-02-07 DIAGNOSIS — Z79899 Other long term (current) drug therapy: Secondary | ICD-10-CM

## 2024-02-07 NOTE — Progress Notes (Signed)
 Primary Care Physician: Dorena Gander, MD Primary Cardiologist: Dr Swaziland Primary Electrophysiologist: Dr Lawana Pray  Referring Physician: Dr Assunta Blalock Stephen Scott is a 63 y.o. male with a history of CAD, HLD, HTN, prediabetes, atrial flutter, atrial fibrillation who presents for follow up in the [facility].  The patient was initially diagnosed with atrial fibrillation on an event monitor in 2022.  When in afib he has symptoms of "heart pounding", "restlessness", and fatigue. Patient was seen at the ED 11/13/22 with rapid atrial flutter. He was given IV metoprolol  which slowed him to atrial flutter with variable block. His home BB was increased (he was only taking 50 mg). He underwent afib and flutter ablation with Dr Lawana Pray on 01/18/23. He has continued to have recurrent afib and atrial flutter and is scheduled for repeat ablation on Stephen Scott. He was started on amiodarone  as a bridge to ablation on 02/02/24.  Patient returns for follow up for atrial fibrillation and amiodarone  monitoring. He started taking amiodarone  yesterday. He is in SR today with resolution of his symptoms. No bleeding issues on anticoagulation.   Today, he  denies symptoms of palpitations, chest pain, shortness of breath, orthopnea, PND, lower extremity edema, dizziness, presyncope, syncope, bleeding, or neurologic sequela. The patient is tolerating medications without difficulties and is otherwise without complaint today.    Atrial Fibrillation Risk Factors:  he does have symptoms or diagnosis of sleep apnea. he does not have a history of rheumatic fever. he does have a history of alcohol use. The patient does not have a history of early familial atrial fibrillation or other arrhythmias.   Atrial Fibrillation Management history:  Previous antiarrhythmic drugs: Multaq  Previous cardioversions: none Previous ablations: 01/18/23 Anticoagulation history: Eliquis , Xarelto     Past Medical  History:  Diagnosis Date   Anemia    as a small child   Anxiety    Arthritis    "lower spine; knees" (11/01/2017)   Chronic lower back pain    Depression    GERD (gastroesophageal reflux disease)    Headache    High cholesterol    History of kidney stones    Hypertension    Pneumonia 06/2017; 08/2017   walking pneumonia; treated   Spinal stenosis    "lower back" (11/01/2017)    Current Outpatient Medications  Medication Sig Dispense Refill   acetaminophen  (TYLENOL ) 500 MG tablet Take 500-1,000 mg by mouth every 6 (six) hours as needed for moderate pain (pain score 4-6).     albuterol (VENTOLIN HFA) 108 (90 Base) MCG/ACT inhaler      allopurinol (ZYLOPRIM) 300 MG tablet Take 300 mg by mouth daily.     amiodarone  (PACERONE ) 200 MG tablet Take 1 tablet (200 mg total) by mouth 2 (two) times daily for 30 days, THEN 1 tablet (200 mg total) daily. 60 tablet 1   atorvastatin  (LIPITOR ) 80 MG tablet Take 1 tablet (80 mg total) by mouth daily. 90 tablet 3   chlorthalidone  (HYGROTON ) 25 MG tablet TAKE 1 TABLET(25 MG) BY MOUTH DAILY 30 tablet 0   CONTOUR NEXT TEST test strip as directed.     diphenhydrAMINE  (BENADRYL ) 50 MG capsule Take one capsule 1 hour prior to scan. 1 capsule 0   DULoxetine (CYMBALTA) 30 MG capsule Take 30 mg by mouth daily.     hydrOXYzine (ATARAX) 25 MG tablet Take 25-50 mg by mouth at bedtime as needed.     loratadine  (CLARITIN ) 10 MG tablet Take 10 mg by mouth daily  as needed for allergies.     losartan  (COZAAR ) 100 MG tablet Take 50 mg by mouth daily.     metoprolol  succinate (TOPROL -XL) 100 MG 24 hr tablet Take 1 tablet (100 mg total) by mouth daily. Take with or immediately following a meal. 90 tablet 3   metoprolol  tartrate (LOPRESSOR ) 25 MG tablet Take 0.5 tablets (12.5 mg total) by mouth as needed.     Misc Natural Products (TART CHERRY ADVANCED PO) Take 1 capsule by mouth daily.     nitroGLYCERIN  (NITROSTAT ) 0.4 MG SL tablet Place 1 tablet (0.4 mg total) under the  tongue every 5 (five) minutes as needed for chest pain. 90 tablet 3   OZEMPIC, 2 MG/DOSE, 8 MG/3ML SOPN Inject 2 mg into the skin once a week. Saturday     potassium chloride (KLOR-CON) 10 MEQ tablet Take 10 mEq by mouth daily.     predniSONE  (DELTASONE ) 20 MG tablet Take 2 tablets (40 mg total) by mouth daily. 10 tablet 0   predniSONE  (DELTASONE ) 50 MG tablet Take one tablet 13 hours, 7 hours, and 1 hour prior to scan. 3 tablet 0   rivaroxaban  (XARELTO ) 20 MG TABS tablet Take 1 tablet (20 mg total) by mouth daily with supper. 30 tablet 6   sertraline  (ZOLOFT ) 50 MG tablet Take 50 mg by mouth daily.     No current facility-administered medications for this encounter.    ROS- All systems are reviewed and negative except as per the HPI above.  Physical Exam: Vitals:   02/07/24 0919  BP: 108/74  Pulse: 81  Weight: 96.7 kg  Height: 5\' 11"  (1.803 m)     GEN: Well nourished, well developed in no acute distress CARDIAC: Regular rate and rhythm, no murmurs, rubs, gallops RESPIRATORY:  Clear to auscultation without rales, wheezing or rhonchi  ABDOMEN: Soft, non-tender, non-distended EXTREMITIES:  No edema; No deformity    Wt Readings from Last 3 Encounters:  02/07/24 96.7 kg  02/02/24 95.9 kg  01/10/24 96.6 kg    EKG today demonstrates  SR Vent. rate 81 BPM PR interval 156 ms QRS duration 86 ms QT/QTcB 390/453 ms   Echo 06/30/21 demonstrated   1. Left ventricular ejection fraction, by estimation, is 60 to 65%. The  left ventricle has normal function. The left ventricle has no regional  wall motion abnormalities. Left ventricular diastolic parameters are  consistent with Grade I diastolic dysfunction (impaired relaxation).   2. Right ventricular systolic function is normal. The right ventricular  size is normal. Tricuspid regurgitation signal is inadequate for assessing PA pressure.   3. Left atrial size was mildly dilated.   4. The mitral valve is normal in structure. No  evidence of mitral valve  regurgitation. No evidence of mitral stenosis.   5. The aortic valve is normal in structure. Aortic valve regurgitation is  not visualized. No aortic stenosis is present.   6. The inferior vena cava is normal in size with greater than 50%  respiratory variability, suggesting right atrial pressure of 3 mmHg  Epic records are reviewed at length today  CHA2DS2-VASc Score = 2  The patient's score is based upon: CHF History: 0 HTN History: 1 Diabetes History: 0 Stroke History: 0 Vascular Disease History: 1 Age Score: 0 Gender Score: 0       ASSESSMENT AND PLAN: Persistent Atrial Fibrillation/atrial flutter The patient's CHA2DS2-VASc score is 2, indicating a 2.2% annual risk of stroke.   S/p afib and flutter ablation 01/18/23 Previously failed Multaq   Scheduled for repeat ablation Stephen Scott Patient chemically converted to SR.  Continue amiodarone  200 mg BID. Decrease to once daily on 6/5. Repeat ECG in 3 weeks.  Continue Xarelto  20 mg daily Continue Toprol  100 mg daily with Lopressor  12.5 mg q 6 hours for heart racing.   Secondary Hypercoagulable State (ICD10:  D68.69) The patient is at significant risk for stroke/thromboembolism based upon his CHA2DS2-VASc Score of 2.  Continue Rivaroxaban  (Xarelto ). No bleeding issues.   High Risk Medication Monitoring (ICD 10: Z79.899) Intervals on ECG acceptable for amiodarone  monitoring.   CAD S/p DES 2021 Stress test 2023 low risk for ischemia No anginal symptoms  HTN Stable on current regimen Continue Losartan  at 50 mg daily   Follow up for ECG in 3 weeks then for ablation as scheduled.        Stephen Ates PA-C Afib Clinic [facility] [address] [place], [place] [address] [phone] 02/07/2024 9:26 AM

## 2024-02-07 NOTE — Patient Instructions (Signed)
 June 5th reduce amiodarone  to 200mg  ONCE a day  Keep losartan  at 50mg  daily

## 2024-02-23 ENCOUNTER — Encounter: Payer: Self-pay | Admitting: *Deleted

## 2024-02-28 ENCOUNTER — Ambulatory Visit (HOSPITAL_COMMUNITY): Admitting: Internal Medicine

## 2024-03-01 ENCOUNTER — Ambulatory Visit (HOSPITAL_COMMUNITY)
Admission: RE | Admit: 2024-03-01 | Discharge: 2024-03-01 | Disposition: A | Discharge status: Home / Self Care | Source: Ambulatory Visit | Attending: Physician Assistant | Admitting: Physician Assistant

## 2024-03-01 DIAGNOSIS — Z79899 Other long term (current) drug therapy: Secondary | ICD-10-CM

## 2024-03-01 DIAGNOSIS — I480 Paroxysmal atrial fibrillation: Secondary | ICD-10-CM

## 2024-03-01 NOTE — Progress Notes (Signed)
 Patient returns for ECG after amiodarone  start. ECG shows:  Atrial flutter with variable block Vent. rate 116 BPM PR interval * ms QRS duration 90 ms QT/QTcB 334/464 ms  Appears to be going in and out of atrial flutter and SR. He feels well. Continue amiodarone  once daily. Follow up for afib ablation as scheduled.

## 2024-03-12 ENCOUNTER — Telehealth (HOSPITAL_COMMUNITY): Payer: Self-pay | Admitting: Emergency Medicine

## 2024-03-12 NOTE — Telephone Encounter (Signed)
 Attempted to call patient regarding upcoming cardiac CT appointment. Left message on voicemail with name and callback number Rockwell Alexandria RN Navigator Cardiac Imaging [facility] [phone] Office [phone] Cell

## 2024-03-13 ENCOUNTER — Ambulatory Visit (HOSPITAL_COMMUNITY)

## 2024-03-13 ENCOUNTER — Ambulatory Visit (HOSPITAL_COMMUNITY): Admission: RE | Admit: 2024-03-13 | Source: Ambulatory Visit

## 2024-03-13 ENCOUNTER — Telehealth (HOSPITAL_COMMUNITY): Payer: Self-pay | Admitting: Emergency Medicine

## 2024-03-13 ENCOUNTER — Inpatient Hospital Stay (HOSPITAL_COMMUNITY): Admission: RE | Admit: 2024-03-13 | Source: Ambulatory Visit

## 2024-03-13 MED ORDER — DIPHENHYDRAMINE HCL 50 MG PO CAPS: ORAL_CAPSULE | ORAL | 0 refills | Status: DC

## 2024-03-13 MED ORDER — PREDNISONE 50 MG PO TABS: ORAL_TABLET | ORAL | 0 refills | Status: DC

## 2024-03-13 NOTE — Telephone Encounter (Signed)
 Reaching out to patient to offer assistance regarding upcoming cardiac imaging study; pt verbalizes understanding of appt date/time, parking situation and where to check in, pre-test NPO status and medications ordered, and verified current allergies; name and call back number provided for further questions should they arise Rockwell Alexandria RN Navigator Cardiac Imaging [facility] [phone] office [phone] cell

## 2024-03-14 ENCOUNTER — Ambulatory Visit (HOSPITAL_COMMUNITY)
Admission: RE | Admit: 2024-03-14 | Discharge: 2024-03-14 | Disposition: A | Discharge status: Home / Self Care | Source: Ambulatory Visit | Attending: Cardiology | Admitting: Cardiology

## 2024-03-14 DIAGNOSIS — I480 Paroxysmal atrial fibrillation: Secondary | ICD-10-CM | POA: Diagnosis present

## 2024-03-14 DIAGNOSIS — I4892 Unspecified atrial flutter: Secondary | ICD-10-CM | POA: Insufficient documentation

## 2024-03-14 DIAGNOSIS — Z01812 Encounter for preprocedural laboratory examination: Secondary | ICD-10-CM | POA: Diagnosis present

## 2024-03-14 MED ORDER — IOHEXOL 350 MG/ML SOLN
95.0000 mL | Freq: Once | INTRAVENOUS | Status: AC | PRN
  Administered 2024-03-14: 95 mL via INTRAVENOUS

## 2024-03-15 ENCOUNTER — Telehealth: Payer: Self-pay

## 2024-03-15 NOTE — Telephone Encounter (Signed)
 Pre-procedure Call:  Called patient, NA, left message on VM   Camnitz 04/03/24 @ 12 (arrive at 10) CT: 03/14/24 - completed Labs: not done Bloodthinner: Xarelto  20mg  x1

## 2024-03-16 ENCOUNTER — Other Ambulatory Visit: Payer: Self-pay | Admitting: Cardiology

## 2024-03-16 NOTE — Telephone Encounter (Signed)
This is a A-Fib clinic pt 

## 2024-03-23 ENCOUNTER — Telehealth (HOSPITAL_COMMUNITY): Payer: Self-pay

## 2024-03-23 NOTE — Telephone Encounter (Signed)
 Spoke with patient to discuss upcoming procedure.   CT: completed.  Labs: ordered- patient will complete by July 3.   Any recent signs of acute illness or been started on antibiotics? No Any new medications started? No Any medications to hold? Hold Ozempic for 7 days prior to procedure- last dose June 27.    Any missed doses of blood thinner? No Advised patient to continue taking ANTICOAGULANT: Xarelto  (Rivaroxaban ) daily without missing any doses.  Medication instructions:  On the morning of your procedure DO NOT take any medication., including Xarelto  or the procedure may be rescheduled. Nothing to eat or drink after midnight prior to your procedure.  Confirmed patient is scheduled for Atrial Fibrillation Ablation, Atrial Flutter Ablation on Tuesday, July 8 with Dr. Soyla Norton. Instructed patient to arrive at the Main Entrance A at [facility]: [address] [place], [place] [address] and check in at Admitting at 10:30 AM.   Advised of plan to go home the same day and will only stay overnight if medically necessary. You MUST have a responsible adult to drive you home and MUST be with you the first 24 hours after you arrive home or your procedure could be cancelled.  Patient verbalized understanding to all instructions provided and agreed to proceed with procedure.

## 2024-03-26 LAB — CBC
Hematocrit: 42.1 % (ref 37.5–51.0)
Hemoglobin: 14.1 g/dL (ref 13.0–17.7)
MCH: 31.8 pg (ref 26.6–33.0)
MCHC: 33.5 g/dL (ref 31.5–35.7)
MCV: 95 fL (ref 79–97)
Platelets: 229 10*3/uL (ref 150–450)
RBC: 4.43 x10E6/uL (ref 4.14–5.80)
RDW: 12.8 % (ref 11.6–15.4)
WBC: 8.5 10*3/uL (ref 3.4–10.8)

## 2024-03-27 LAB — BASIC METABOLIC PANEL WITH GFR
BUN/Creatinine Ratio: 22 (ref 10–24)
BUN: 21 mg/dL (ref 8–27)
CO2: 20 mmol/L (ref 20–29)
Calcium: 9 mg/dL (ref 8.6–10.2)
Chloride: 103 mmol/L (ref 96–106)
Creatinine, Ser: 0.96 mg/dL (ref 0.76–1.27)
Glucose: 75 mg/dL (ref 70–99)
Potassium: 3.4 mmol/L — ABNORMAL LOW (ref 3.5–5.2)
Sodium: 139 mmol/L (ref 134–144)
eGFR: 88 mL/min/{1.73_m2} (ref >59)

## 2024-03-28 ENCOUNTER — Other Ambulatory Visit (HOSPITAL_COMMUNITY): Payer: Self-pay | Admitting: *Deleted

## 2024-03-28 ENCOUNTER — Encounter: Payer: Self-pay | Admitting: Emergency Medicine

## 2024-03-28 ENCOUNTER — Telehealth (HOSPITAL_COMMUNITY): Payer: Self-pay | Admitting: *Deleted

## 2024-03-28 DIAGNOSIS — I480 Paroxysmal atrial fibrillation: Secondary | ICD-10-CM

## 2024-03-28 DIAGNOSIS — Z01812 Encounter for preprocedural laboratory examination: Secondary | ICD-10-CM

## 2024-03-28 DIAGNOSIS — Z79899 Other long term (current) drug therapy: Secondary | ICD-10-CM

## 2024-03-28 MED ORDER — RIVAROXABAN 20 MG PO TABS: 20.0000 mg | ORAL_TABLET | Freq: Every day | ORAL | 6 refills | Status: AC

## 2024-03-28 MED ORDER — AMIODARONE HCL 200 MG PO TABS: 200.0000 mg | ORAL_TABLET | Freq: Every day | ORAL | 6 refills | Status: DC

## 2024-03-28 NOTE — Telephone Encounter (Signed)
 Patient called to report he missed doses of xarelto  on 6/27 and 6/28 - scheduled for ablation 7/8; Informed patient will forward to Dr Inocencio office for advisement with upcoming ablation and CT completed prior to missed doses. Patient is best reached at [phone].

## 2024-04-02 NOTE — Pre-Procedure Instructions (Signed)
 Instructed patient on the following items: Arrival time 1000 Nothing to eat or drink after midnight No meds AM of procedure Responsible person to drive you home and stay with you for 24 hrs  Have you missed any doses of anti-coagulant Xarelto - takes once a day,  missed a couple doses.  We will do EKG on arrival and possible TEE if needed.

## 2024-04-03 ENCOUNTER — Ambulatory Visit (HOSPITAL_COMMUNITY): Admitting: Certified Registered Nurse Anesthetist

## 2024-04-03 ENCOUNTER — Encounter (HOSPITAL_COMMUNITY)
Admission: RE | Disposition: A | Discharge status: Home / Self Care | Payer: Self-pay | Source: Home / Self Care | Attending: Cardiology

## 2024-04-03 ENCOUNTER — Encounter (HOSPITAL_COMMUNITY): Payer: Self-pay | Admitting: Cardiology

## 2024-04-03 ENCOUNTER — Other Ambulatory Visit: Payer: Self-pay

## 2024-04-03 ENCOUNTER — Ambulatory Visit (HOSPITAL_COMMUNITY)
Admission: RE | Admit: 2024-04-03 | Discharge: 2024-04-03 | Disposition: A | Discharge status: Home / Self Care | Attending: Cardiology | Admitting: Cardiology

## 2024-04-03 DIAGNOSIS — I4819 Other persistent atrial fibrillation: Secondary | ICD-10-CM | POA: Diagnosis not present

## 2024-04-03 DIAGNOSIS — I483 Typical atrial flutter: Secondary | ICD-10-CM

## 2024-04-03 DIAGNOSIS — I10 Essential (primary) hypertension: Secondary | ICD-10-CM | POA: Insufficient documentation

## 2024-04-03 DIAGNOSIS — I2510 Atherosclerotic heart disease of native coronary artery without angina pectoris: Secondary | ICD-10-CM | POA: Diagnosis not present

## 2024-04-03 DIAGNOSIS — I4892 Unspecified atrial flutter: Secondary | ICD-10-CM

## 2024-04-03 DIAGNOSIS — Z955 Presence of coronary angioplasty implant and graft: Secondary | ICD-10-CM | POA: Insufficient documentation

## 2024-04-03 DIAGNOSIS — I4891 Unspecified atrial fibrillation: Secondary | ICD-10-CM

## 2024-04-03 DIAGNOSIS — I480 Paroxysmal atrial fibrillation: Secondary | ICD-10-CM

## 2024-04-03 HISTORY — PX: A-FLUTTER ABLATION: EP1230

## 2024-04-03 HISTORY — PX: ATRIAL FIBRILLATION ABLATION: EP1191

## 2024-04-03 LAB — POCT I-STAT, CHEM 8
BUN: 20 mg/dL (ref 8–23)
Calcium, Ion: 1.13 mmol/L — ABNORMAL LOW (ref 1.15–1.40)
Chloride: 105 mmol/L (ref 98–111)
Creatinine, Ser: 1.1 mg/dL (ref 0.61–1.24)
Glucose, Bld: 87 mg/dL (ref 70–99)
HCT: 36 % — ABNORMAL LOW (ref 39.0–52.0)
Hemoglobin: 12.2 g/dL — ABNORMAL LOW (ref 13.0–17.0)
Potassium: 3.1 mmol/L — ABNORMAL LOW (ref 3.5–5.1)
Sodium: 143 mmol/L (ref 135–145)
TCO2: 22 mmol/L (ref 22–32)

## 2024-04-03 LAB — POCT ACTIVATED CLOTTING TIME
Activated Clotting Time: 279 s
Activated Clotting Time: 360 s

## 2024-04-03 LAB — GLUCOSE, CAPILLARY: Glucose-Capillary: 89 mg/dL (ref 70–99)

## 2024-04-03 SURGERY — ATRIAL FIBRILLATION ABLATION
Anesthesia: General

## 2024-04-03 MED ORDER — SODIUM CHLORIDE 0.9 % IV SOLN: INTRAVENOUS | Status: DC

## 2024-04-03 MED ORDER — PHENYLEPHRINE 80 MCG/ML (10ML) SYRINGE FOR IV PUSH (FOR BLOOD PRESSURE SUPPORT)
PREFILLED_SYRINGE | INTRAVENOUS | Status: DC | PRN
  Administered 2024-04-03 (×2): 80 ug via INTRAVENOUS

## 2024-04-03 MED ORDER — SODIUM CHLORIDE 0.9% FLUSH
3.0000 mL | INTRAVENOUS | Status: DC | PRN
Start: 2024-04-03 — End: 2024-04-03

## 2024-04-03 MED ORDER — ATROPINE SULFATE 1 MG/10ML IJ SOSY
PREFILLED_SYRINGE | INTRAMUSCULAR | Status: DC | PRN
  Administered 2024-04-03: 1 mg via INTRAVENOUS

## 2024-04-03 MED ORDER — ACETAMINOPHEN 325 MG PO TABS: 650.0000 mg | ORAL_TABLET | ORAL | Status: DC | PRN

## 2024-04-03 MED ORDER — LIDOCAINE 2% (20 MG/ML) 5 ML SYRINGE
INTRAMUSCULAR | Status: DC | PRN
  Administered 2024-04-03: 60 mg via INTRAVENOUS

## 2024-04-03 MED ORDER — ATROPINE SULFATE 1 MG/10ML IJ SOSY
PREFILLED_SYRINGE | INTRAMUSCULAR | Status: AC
  Filled 2024-04-03: qty 10

## 2024-04-03 MED ORDER — ONDANSETRON HCL 4 MG/2ML IJ SOLN: 4.0000 mg | Freq: Four times a day (QID) | INTRAMUSCULAR | Status: DC | PRN

## 2024-04-03 MED ORDER — HEPARIN SODIUM (PORCINE) 1000 UNIT/ML IJ SOLN
INTRAMUSCULAR | Status: DC | PRN
  Administered 2024-04-03: 14000 [IU] via INTRAVENOUS
  Administered 2024-04-03: 6000 [IU] via INTRAVENOUS

## 2024-04-03 MED ORDER — SUGAMMADEX SODIUM 200 MG/2ML IV SOLN
INTRAVENOUS | Status: DC | PRN
  Administered 2024-04-03: 200 mg via INTRAVENOUS

## 2024-04-03 MED ORDER — MIDAZOLAM HCL 5 MG/5ML IJ SOLN
INTRAMUSCULAR | Status: DC | PRN
  Administered 2024-04-03: 2 mg via INTRAVENOUS

## 2024-04-03 MED ORDER — SODIUM CHLORIDE 0.9 % IV SOLN
250.0000 mL | INTRAVENOUS | Status: DC | PRN
Start: 2024-04-03 — End: 2024-04-03

## 2024-04-03 MED ORDER — SODIUM CHLORIDE 0.9% FLUSH: 3.0000 mL | Freq: Two times a day (BID) | INTRAVENOUS | Status: DC

## 2024-04-03 MED ORDER — PHENYLEPHRINE HCL-NACL 20-0.9 MG/250ML-% IV SOLN
INTRAVENOUS | Status: DC | PRN
  Administered 2024-04-03: 25 ug/min via INTRAVENOUS

## 2024-04-03 MED ORDER — ROCURONIUM BROMIDE 10 MG/ML (PF) SYRINGE
PREFILLED_SYRINGE | INTRAVENOUS | Status: DC | PRN
  Administered 2024-04-03: 50 mg via INTRAVENOUS

## 2024-04-03 MED ORDER — FENTANYL CITRATE (PF) 100 MCG/2ML IJ SOLN
INTRAMUSCULAR | Status: DC | PRN
  Administered 2024-04-03 (×2): 50 ug via INTRAVENOUS

## 2024-04-03 MED ORDER — PROPOFOL 10 MG/ML IV BOLUS
INTRAVENOUS | Status: DC | PRN
  Administered 2024-04-03: 180 mg via INTRAVENOUS
  Administered 2024-04-03: 20 mg via INTRAVENOUS

## 2024-04-03 MED ORDER — PROTAMINE SULFATE 10 MG/ML IV SOLN
INTRAVENOUS | Status: DC | PRN
  Administered 2024-04-03: 40 mg via INTRAVENOUS

## 2024-04-03 MED ORDER — HEPARIN (PORCINE) IN NACL 1000-0.9 UT/500ML-% IV SOLN
INTRAVENOUS | Status: DC | PRN
  Administered 2024-04-03 (×3): 500 mL

## 2024-04-03 MED ORDER — DEXAMETHASONE SODIUM PHOSPHATE 10 MG/ML IJ SOLN
INTRAMUSCULAR | Status: DC | PRN
  Administered 2024-04-03: 4 mg via INTRAVENOUS

## 2024-04-03 MED ORDER — ONDANSETRON HCL 4 MG/2ML IJ SOLN
INTRAMUSCULAR | Status: DC | PRN
Start: 2024-04-03 — End: 2024-04-03
  Administered 2024-04-03: 4 mg via INTRAVENOUS

## 2024-04-03 SURGICAL SUPPLY — 20 items
CABLE FARASTAR GEN2 SNGL USE (CABLE) IMPLANT
CATH EZ STEER NAV 8MM D-F CUR (ABLATOR) IMPLANT
CATH FARAWAVE 2.0 31 (CATHETERS) IMPLANT
CATH GE 8FR SOUNDSTAR (CATHETERS) IMPLANT
CATH OCTARAY 2.0 F 3-3-3-3-3 (CATHETERS) IMPLANT
CATH WEB BI DIR CSDF CRV REPRO (CATHETERS) IMPLANT
CLOSURE MYNX CONTROL 6F/7F (Vascular Products) IMPLANT
CLOSURE PERCLOSE PROSTYLE (VASCULAR PRODUCTS) IMPLANT
COVER SWIFTLINK CONNECTOR (BAG) ×1 IMPLANT
DILATOR VESSEL 38 20CM 16FR (INTRODUCER) IMPLANT
GUIDEWIRE INQWIRE 1.5J.035X260 (WIRE) IMPLANT
KIT VERSACROSS CNCT FARADRIVE (KITS) IMPLANT
PACK EP LF (CUSTOM PROCEDURE TRAY) ×1 IMPLANT
PAD DEFIB RADIO PHYSIO CONN (PAD) ×1 IMPLANT
PATCH CARTO3 (PAD) IMPLANT
SHEATH FARADRIVE STEERABLE (SHEATH) IMPLANT
SHEATH PINNACLE 5F 10CM (SHEATH) IMPLANT
SHEATH PINNACLE 8F 10CM (SHEATH) IMPLANT
SHEATH PINNACLE VASC 9FR (SHEATH) IMPLANT
SHEATH PROBE COVER 6X72 (BAG) IMPLANT

## 2024-04-03 NOTE — Anesthesia Preprocedure Evaluation (Signed)
 Anesthesia Evaluation  Patient identified by MRN, date of birth, ID band Patient awake    Reviewed: Allergy & Precautions, H&P , NPO status , Patient's Chart, lab work & pertinent test results  Airway Mallampati: II   Neck ROM: full    Dental   Pulmonary neg pulmonary ROS   breath sounds clear to auscultation       Cardiovascular hypertension, + CAD and + Cardiac Stents  + dysrhythmias Atrial Fibrillation  Rhythm:regular Rate:Normal     Neuro/Psych  Headaches PSYCHIATRIC DISORDERS Anxiety Depression       GI/Hepatic ,GERD  ,,  Endo/Other    Renal/GU      Musculoskeletal  (+) Arthritis ,    Abdominal   Peds  Hematology   Anesthesia Other Findings   Reproductive/Obstetrics                              Anesthesia Physical Anesthesia Plan  ASA: 3  Anesthesia Plan: General   Post-op Pain Management:    Induction: Intravenous  PONV Risk Score and Plan: 2 and Ondansetron , Dexamethasone , Midazolam  and Treatment may vary due to age or medical condition  Airway Management Planned: Oral ETT  Additional Equipment: TEE  Intra-op Plan:   Post-operative Plan: Extubation in OR  Informed Consent: I have reviewed the patients History and Physical, chart, labs and discussed the procedure including the risks, benefits and alternatives for the proposed anesthesia with the patient or authorized representative who has indicated his/her understanding and acceptance.     Dental advisory given  Plan Discussed with: CRNA, Anesthesiologist and Surgeon  Anesthesia Plan Comments:         Anesthesia Quick Evaluation

## 2024-04-03 NOTE — Discharge Instructions (Signed)

## 2024-04-03 NOTE — Progress Notes (Addendum)
 Up and walked and tolerated well; bilat groins stable, no bleeding or hematoma;Dr Camnitz in earlier and ok to d/c home

## 2024-04-03 NOTE — H&P (Signed)
  Electrophysiology Office Note:   Date:  04/03/2024  ID:  Stephen Scott, DOB 03-05-1961, MRN [id number]  Primary Cardiologist: Stephen Swaziland, MD Primary Heart Failure: None Electrophysiologist: Stephen Slawinski [name], MD      History of Present Illness:   Stephen Scott is a 63 y.o. male with h/o coronary artery disease, hyperlipidemia, hypertension, prediabetes, atrial fibrillation seen today for routine electrophysiology followup.   Today, denies symptoms of palpitations, chest pain, dyspnea, orthopnea, PND, lower extremity edema, claudication, dizziness, presyncope, syncope, bleeding, or neurologic sequela. The patient is tolerating medications without difficulties. Plan ablation today.   EP Information / Studies Reviewed:    EKG is ordered today. Personal review as below.        Risk Assessment/Calculations:    CHA2DS2-VASc Score = 2   This indicates a 2.2% annual risk of stroke. The patient's score is based upon: CHF History: 0 HTN History: 1 Diabetes History: 0 Stroke History: 0 Vascular Disease History: 1 Age Score: 0 Gender Score: 0       STOP-Bang Score:          Physical Exam:   VS:  There were no vitals taken for this visit.   Wt Readings from Last 3 Encounters:  02/07/24 96.7 kg  02/02/24 95.9 kg  01/10/24 96.6 kg    GEN: No acute distress.   Neck: No JVD Cardiac: RRR, no murmurs, rubs, or gallops.  Respiratory: normal BS bilaterally. GI: Soft, nontender, non-distended  MS: No edema; No deformity. Neuro:  Nonfocal  Skin: warm and dry Psych: Normal affect    ASSESSMENT AND PLAN:    1.  Paroxysmal atrial fibrillation/flutter: Stephen Scott has presented today for surgery, with the diagnosis of AF/flutter.  The various methods of treatment have been discussed with the patient and family. After consideration of risks, benefits and other options for treatment, the patient has consented to  Procedure(s): Catheter ablation as a surgical intervention .  Risks  include but not limited to complete heart block, stroke, esophageal damage, nerve damage, bleeding, vascular damage, tamponade, perforation, MI, and death. The patient's history has been reviewed, patient examined, no change in status, stable for surgery.  I have reviewed the patient's chart and labs.  Questions were answered to the patient's satisfaction.    Stephen Paiva Norton, MD 04/03/2024 10:43 AM

## 2024-04-03 NOTE — Anesthesia Procedure Notes (Signed)
 Procedure Name: Intubation Date/Time: 04/03/2024 11:56 AM  Performed by: Claudene Arlin LABOR, CRNAPre-anesthesia Checklist: Patient identified, Emergency Drugs available, Suction available and Patient being monitored Patient Re-evaluated:Patient Re-evaluated prior to induction Oxygen Delivery Method: Circle system utilized Preoxygenation: Pre-oxygenation with 100% oxygen Induction Type: IV induction Ventilation: Mask ventilation without difficulty Laryngoscope Size: Miller and 2 Grade View: Grade I Tube type: Oral Tube size: 7.5 mm Number of attempts: 1 Airway Equipment and Method: Stylet Placement Confirmation: ETT inserted through vocal cords under direct vision, positive ETCO2 and breath sounds checked- equal and bilateral Secured at: 23 cm Tube secured with: Tape Dental Injury: Teeth and Oropharynx as per pre-operative assessment

## 2024-04-03 NOTE — Transfer of Care (Signed)
 Immediate Anesthesia Transfer of Care Note  Patient: Stephen Scott  Procedure(s) Performed: ATRIAL FIBRILLATION ABLATION A-FLUTTER ABLATION  Patient Location: Cath Lab  Anesthesia Type:General  Level of Consciousness: awake, alert , oriented, and patient cooperative  Airway & Oxygen Therapy: Patient Spontanous Breathing and Patient connected to nasal cannula oxygen  Post-op Assessment: Report given to RN, Post -op Vital signs reviewed and stable, and Patient moving all extremities X 4  Post vital signs: Reviewed and stable  Last Vitals:  Vitals Value Taken Time  BP 148/75 04/03/24 13:38  Temp    Pulse 84 04/03/24 13:40  Resp 17 04/03/24 13:40  SpO2 100 % 04/03/24 13:40  Vitals shown include unfiled device data.  Last Pain:  Vitals:   04/03/24 1113  TempSrc: Oral  PainSc:          Complications: There were no known notable events for this encounter.

## 2024-04-04 ENCOUNTER — Telehealth (HOSPITAL_COMMUNITY): Payer: Self-pay

## 2024-04-04 NOTE — Telephone Encounter (Signed)
 Spoke with patient to complete post procedure follow up call.  Patient reports no complications with groin sites.   Instructions reviewed with patient:  Remove large bandage at puncture site after 24 hours. It is normal to have bruising, tenderness, mild swelling, and a pea or marble sized lump/knot at the groin site which can take up to three months to resolve.  Get help right away if you notice sudden swelling at the puncture site.  Check your puncture site every day for signs of infection: fever, redness, swelling, pus drainage, warmth, foul odor or excessive pain. If this occurs, please call the office at [phone], to speak with the nurse. Get help right away if your puncture site is bleeding and the bleeding does not stop after applying firm pressure to the area.  You may continue to have skipped beats/ atrial fibrillation during the first several months after your procedure.  It is very important not to miss any doses of your blood thinner Xarelto .    You will follow up with the Afib clinic on 05/04/24 and follow up with the Afib clinic on 07/04/24.    Patient verbalized understanding to all instructions provided.

## 2024-04-04 NOTE — Anesthesia Postprocedure Evaluation (Signed)
 Anesthesia Post Note  Patient: Stephen Scott  Procedure(s) Performed: ATRIAL FIBRILLATION ABLATION A-FLUTTER ABLATION     Patient location during evaluation: PACU Anesthesia Type: General Level of consciousness: awake and alert Pain management: pain level controlled Vital Signs Assessment: post-procedure vital signs reviewed and stable Respiratory status: spontaneous breathing, nonlabored ventilation, respiratory function stable and patient connected to nasal cannula oxygen Cardiovascular status: blood pressure returned to baseline and stable Postop Assessment: no apparent nausea or vomiting Anesthetic complications: no   There were no known notable events for this encounter.  Last Vitals:  Vitals:   04/03/24 1600 04/03/24 1700  BP: (!) 156/74 (!) 163/80  Pulse: 68 70  Resp: 15 (!) 21  Temp:    SpO2: 95% 98%    Last Pain:  Vitals:   04/03/24 1346  TempSrc: Oral  PainSc:                  Debbera Wolken S

## 2024-05-04 ENCOUNTER — Ambulatory Visit (HOSPITAL_COMMUNITY): Admitting: Physician Assistant

## 2024-05-04 NOTE — Progress Notes (Incomplete)
 Primary Care Physician: Rolinda Millman, MD Primary Cardiologist: Dr Sw[place]iland Primary Electrophysiologist: Dr Inocencio  Referring Physician: Dr Rolinda Rush Stephen Scott is a 63 y.o. male with a history of CAD, HLD, HTN, prediabetes, atrial flutter, atrial fibrillation who presents for follow up in the [facility].  The patient was initially diagnosed with atrial fibrillation on an event monitor in 2022.  When in afib he has symptoms of heart pounding, restlessness, and fatigue. Patient was seen at the ED 11/13/22 with rapid atrial flutter. He was given IV metoprolol  which slowed him to atrial flutter with variable block. His home BB was increased (he was only taking 50 mg). He underwent afib and flutter ablation with Dr Inocencio on 01/18/23. He has continued to have recurrent afib and atrial flutter and is scheduled for repeat ablation on 04/03/24. He was started on amiodarone  as a bridge to ablation on 02/02/24.  Patient returns for follow up for atrial fibrillation. He is s/p repeat ablation with Dr Inocencio on 04/03/24. ***  Today, he  denies symptoms of ***palpitations, chest pain, shortness of breath, orthopnea, PND, lower extremity edema, dizziness, presyncope, syncope, snoring, daytime somnolence, bleeding, or neurologic sequela. The patient is tolerating medications without difficulties and is otherwise without complaint today.    Atrial Fibrillation Risk Factors:  he does have symptoms or diagnosis of sleep apnea. he does not have a history of rheumatic fever. he does have a history of alcohol use. The patient does not have a history of early familial atrial fibrillation or other arrhythmias.   Atrial Fibrillation Management history:  Previous antiarrhythmic drugs: Multaq , amiodarone   Previous cardioversions: none Previous ablations: 01/18/23, 04/03/24 Anticoagulation history: Eliquis , Xarelto     Past Medical History:  Diagnosis Date   Anemia    as a small  child   Anxiety    Arthritis    lower spine; knees (11/01/2017)   Chronic lower back pain    Depression    GERD (gastroesophageal reflux disease)    Headache    High cholesterol    History of kidney stones    Hypertension    Pneumonia 06/2017; 08/2017   walking pneumonia; treated   Spinal stenosis    lower back (11/01/2017)    Current Outpatient Medications  Medication Sig Dispense Refill   acetaminophen  (TYLENOL ) 500 MG tablet Take 500-1,000 mg by mouth every 6 (six) hours as needed for moderate pain (pain score 4-6).     allopurinol (ZYLOPRIM) 300 MG tablet Take 300 mg by mouth daily.     amiodarone  (PACERONE ) 200 MG tablet Take 1 tablet (200 mg total) by mouth daily. 30 tablet 6   atorvastatin  (LIPITOR ) 80 MG tablet Take 1 tablet (80 mg total) by mouth daily. 90 tablet 3   chlorthalidone  (HYGROTON ) 25 MG tablet TAKE 1 TABLET(25 MG) BY MOUTH DAILY 30 tablet 3   CONTOUR NEXT TEST test strip as directed.     DULoxetine (CYMBALTA) 30 MG capsule Take 30 mg by mouth daily.     hydrOXYzine (ATARAX) 25 MG tablet Take 25-50 mg by mouth at bedtime as needed for anxiety.     losartan  (COZAAR ) 100 MG tablet Take 50 mg by mouth daily.     metoprolol  succinate (TOPROL -XL) 100 MG 24 hr tablet Take 1 tablet (100 mg total) by mouth daily. Take with or immediately following a meal. 90 tablet 3   metoprolol  tartrate (LOPRESSOR ) 25 MG tablet Take 0.5 tablets (12.5 mg total) by mouth as needed.  nitroGLYCERIN  (NITROSTAT ) 0.4 MG SL tablet Place 0.4 mg under the tongue every 5 (five) minutes as needed for chest pain.     oxymetazoline (AFRIN) 0.05 % nasal spray Place 1 spray into both nostrils 2 (two) times daily as needed for congestion.     OZEMPIC, 2 MG/DOSE, 8 MG/3ML SOPN Inject 2 mg into the skin once a week. Saturday     potassium chloride (KLOR-CON) 10 MEQ tablet Take 10 mEq by mouth daily.     rivaroxaban  (XARELTO ) 20 MG TABS tablet Take 1 tablet (20 mg total) by mouth daily with supper. 30  tablet 6   sertraline  (ZOLOFT ) 50 MG tablet Take 50 mg by mouth daily.     No current facility-administered medications for this visit.    ROS- All systems are reviewed and negative except as per the HPI above.  Physical Exam: There were no vitals filed for this visit.  GEN: Well nourished, well developed in no acute distress NECK: No JVD; No carotid bruits CARDIAC: {EPRHYTHM:28826}, no murmurs, rubs, gallops RESPIRATORY:  Clear to auscultation without rales, wheezing or rhonchi  ABDOMEN: Soft, non-tender, non-distended EXTREMITIES:  No edema; No deformity    Wt Readings from Last 3 Encounters:  04/03/24 92.5 kg  02/07/24 96.7 kg  02/02/24 95.9 kg    EKG today demonstrates  ***   Echo 06/30/21 demonstrated   1. Left ventricular ejection fraction, by estimation, is 60 to 65%. The  left ventricle has normal function. The left ventricle has no regional  wall motion abnormalities. Left ventricular diastolic parameters are  consistent with Grade I diastolic dysfunction (impaired relaxation).   2. Right ventricular systolic function is normal. The right ventricular  size is normal. Tricuspid regurgitation signal is inadequate for assessing PA pressure.   3. Left atrial size was mildly dilated.   4. The mitral valve is normal in structure. No evidence of mitral valve  regurgitation. No evidence of mitral stenosis.   5. The aortic valve is normal in structure. Aortic valve regurgitation is  not visualized. No aortic stenosis is present.   6. The inferior vena cava is normal in size with greater than 50%  respiratory variability, suggesting right atrial pressure of 3 mmHg  Epic records are reviewed at length today  CHA2DS2-VASc Score = 2  The patient's score is based upon: CHF History: 0 HTN History: 1 Diabetes History: 0 Stroke History: 0 Vascular Disease History: 1 Age Score: 0 Gender Score: 0   {Confirm score is correct.  If not, click here to update score.  REFRESH  note.  :1}    ASSESSMENT AND PLAN: Persistent Atrial Fibrillation/atrial flutter (ICD10:  I48.19) The patient's CHA2DS2-VASc score is 2, indicating a 2.2% annual risk of stroke.   Previously failed Multaq  S/p afib and flutter ablation 01/18/23, repeat afib ablation 04/03/24 Patient appears to be maintaining SR*** Continue amiodarone  200 mg daily for now Continue Xarelto  20 mg daily with no missed doses for 3 months post ablation.  Continue Toprol  100 mg daily with Lopressor  12.5 mg q 6 hours PRN for heart racing.   Secondary Hypercoagulable State (ICD10:  D68.69){Click to add to Prob List or Visit Dx  :[id number]} The patient is at significant risk for stroke/thromboembolism based upon his CHA2DS2-VASc Score of 2.  Continue Rivaroxaban  (Xarelto ). No bleeding issues.   High Risk Medication Monitoring (ICD 10: Z79.899) Intervals on ECG acceptable for amiodarone  monitoring. ***  CAD S/p DES 2021 Stress test 2023 low risk for ischemia No anginal symptoms ***  HTN Stable on current regimen ***   Follow up ***in the AF clinic in 2 months.     Stephen Kicks PA-C Afib Clinic [facility] [address] [place], [place] [address] [phone] 05/04/2024 1:06 PM

## 2024-07-04 ENCOUNTER — Encounter (HOSPITAL_COMMUNITY): Payer: Self-pay

## 2024-07-04 ENCOUNTER — Ambulatory Visit (HOSPITAL_COMMUNITY): Attending: Physician Assistant | Admitting: Physician Assistant

## 2024-07-04 NOTE — Progress Notes (Incomplete)
 Primary Care Physician: Rolinda Millman, MD Primary Cardiologist: Dr [place] Primary Electrophysiologist: Dr Inocencio  Referring Physician: Dr Rolinda Rush Stephen Scott is a 63 y.o. male with a history of CAD, HLD, HTN, prediabetes, atrial flutter, atrial fibrillation who presents for follow up in the [facility].  The patient was initially diagnosed with atrial fibrillation on an event monitor in 2022.  When in afib he has symptoms of heart pounding, restlessness, and fatigue. Patient was seen at the ED 11/13/22 with rapid atrial flutter. He was given IV metoprolol  which slowed him to atrial flutter with variable block. His home BB was increased (he was only taking 50 mg). He underwent afib and flutter ablation with Dr Inocencio on 01/18/23. He has continued to have recurrent afib and atrial flutter and is scheduled for repeat ablation on 04/03/24. He was started on amiodarone  as a bridge to ablation. He is s/p repeat afib ablation on 04/03/24.  Patient returns for follow up for atrial fibrillation ***  Today, he  denies symptoms of ***palpitations, chest pain, shortness of breath, orthopnea, PND, lower extremity edema, dizziness, presyncope, syncope, bleeding, or neurologic sequela. The patient is tolerating medications without difficulties and is otherwise without complaint today.    Atrial Fibrillation Risk Factors:  he does have symptoms or diagnosis of sleep apnea. he does not have a history of rheumatic fever. he does have a history of alcohol use. The patient does not have a history of early familial atrial fibrillation or other arrhythmias.   Atrial Fibrillation Management history:  Previous antiarrhythmic drugs: Multaq , amiodarone   Previous cardioversions: none Previous ablations: 01/18/23, 04/03/24 Anticoagulation history: Eliquis , Xarelto     Past Medical History:  Diagnosis Date   Anemia    as a small child   Anxiety    Arthritis    lower spine;  knees (11/01/2017)   Chronic lower back pain    Depression    GERD (gastroesophageal reflux disease)    Headache    High cholesterol    History of kidney stones    Hypertension    Pneumonia 06/2017; 08/2017   walking pneumonia; treated   Spinal stenosis    lower back (11/01/2017)    Current Outpatient Medications  Medication Sig Dispense Refill   acetaminophen  (TYLENOL ) 500 MG tablet Take 500-1,000 mg by mouth every 6 (six) hours as needed for moderate pain (pain score 4-6).     allopurinol (ZYLOPRIM) 300 MG tablet Take 300 mg by mouth daily.     amiodarone  (PACERONE ) 200 MG tablet Take 1 tablet (200 mg total) by mouth daily. 30 tablet 6   atorvastatin  (LIPITOR ) 80 MG tablet Take 1 tablet (80 mg total) by mouth daily. 90 tablet 3   chlorthalidone  (HYGROTON ) 25 MG tablet TAKE 1 TABLET(25 MG) BY MOUTH DAILY 30 tablet 3   CONTOUR NEXT TEST test strip as directed.     DULoxetine (CYMBALTA) 30 MG capsule Take 30 mg by mouth daily.     hydrOXYzine (ATARAX) 25 MG tablet Take 25-50 mg by mouth at bedtime as needed for anxiety.     losartan  (COZAAR ) 100 MG tablet Take 50 mg by mouth daily.     metoprolol  succinate (TOPROL -XL) 100 MG 24 hr tablet Take 1 tablet (100 mg total) by mouth daily. Take with or immediately following a meal. 90 tablet 3   metoprolol  tartrate (LOPRESSOR ) 25 MG tablet Take 0.5 tablets (12.5 mg total) by mouth as needed.     nitroGLYCERIN  (NITROSTAT ) 0.4  MG SL tablet Place 0.4 mg under the tongue every 5 (five) minutes as needed for chest pain.     oxymetazoline (AFRIN) 0.05 % nasal spray Place 1 spray into both nostrils 2 (two) times daily as needed for congestion.     OZEMPIC, 2 MG/DOSE, 8 MG/3ML SOPN Inject 2 mg into the skin once a week. Saturday     potassium chloride (KLOR-CON) 10 MEQ tablet Take 10 mEq by mouth daily.     rivaroxaban  (XARELTO ) 20 MG TABS tablet Take 1 tablet (20 mg total) by mouth daily with supper. 30 tablet 6   sertraline  (ZOLOFT ) 50 MG tablet Take  50 mg by mouth daily.     No current facility-administered medications for this visit.    ROS- All systems are reviewed and negative except as per the HPI above.  Physical Exam: There were no vitals filed for this visit.  GEN: Well nourished, well developed in no acute distress NECK: No JVD; No carotid bruits CARDIAC: {EPRHYTHM:28826}, no murmurs, rubs, gallops RESPIRATORY:  Clear to auscultation without rales, wheezing or rhonchi  ABDOMEN: Soft, non-tender, non-distended EXTREMITIES:  No edema; No deformity    Wt Readings from Last 3 Encounters:  04/03/24 92.5 kg  02/07/24 96.7 kg  02/02/24 95.9 kg    EKG today demonstrates  ***   Echo 06/30/21 demonstrated   1. Left ventricular ejection fraction, by estimation, is 60 to 65%. The  left ventricle has normal function. The left ventricle has no regional  wall motion abnormalities. Left ventricular diastolic parameters are  consistent with Grade I diastolic dysfunction (impaired relaxation).   2. Right ventricular systolic function is normal. The right ventricular  size is normal. Tricuspid regurgitation signal is inadequate for assessing PA pressure.   3. Left atrial size was mildly dilated.   4. The mitral valve is normal in structure. No evidence of mitral valve  regurgitation. No evidence of mitral stenosis.   5. The aortic valve is normal in structure. Aortic valve regurgitation is  not visualized. No aortic stenosis is present.   6. The inferior vena cava is normal in size with greater than 50%  respiratory variability, suggesting right atrial pressure of 3 mmHg  Epic records are reviewed at length today   CHA2DS2-VASc Score = 2  The patient's score is based upon: CHF History: 0 HTN History: 1 Diabetes History: 0 Stroke History: 0 Vascular Disease History: 1 Age Score: 0 Gender Score: 0   {Confirm score is correct.  If not, click here to update score.  REFRESH note.  :1}    ASSESSMENT AND PLAN: Persistent  Atrial Fibrillation/atrial flutter (ICD10:  I48.19) The patient's CHA2DS2-VASc score is 2, indicating a 2.2% annual risk of stroke.   Previously failed Multaq  S/p afib and flutter ablation 01/18/23 and afib ablation 04/03/24 Patient appears to be maintaining SR*** Continue Xarelto  20 mg daily Continue Toprol  100 mg daily with Lopressor  12.5 mg q 6 hours for heart racing.  Stop amiodarone .   Secondary Hypercoagulable State (ICD10:  D68.69){Click to add to Prob List or Visit Dx  :[id number]} The patient is at significant risk for stroke/thromboembolism based upon his CHA2DS2-VASc Score of 2.  Continue Rivaroxaban  (Xarelto ). No bleeding issues.   CAD S/p DES 2021 Stress test 2023 low risk for ischemia No anginal symptoms ***  HTN Stable on current regimen ***   Follow up ***with Dr Inocencio in 6 months.    Daril Kicks PA-C Afib Clinic [facility] [address]  [place], [place] [address] [phone] 07/04/2024 1:17 PM

## 2024-10-16 ENCOUNTER — Other Ambulatory Visit (HOSPITAL_COMMUNITY): Payer: Self-pay | Admitting: Physician Assistant

## 2024-10-24 ENCOUNTER — Other Ambulatory Visit: Payer: Self-pay | Admitting: Cardiology
